# Patient Record
Sex: Female | Born: 1980 | Race: Black or African American | Hispanic: No | Marital: Married | Smoking: Never smoker
Health system: Southern US, Community
[De-identification: ages and names within clinical notes are randomized; demographics above are authoritative.]

## PROBLEM LIST (undated history)

## (undated) ENCOUNTER — Emergency Department (HOSPITAL_COMMUNITY): Admission: EM | Discharge status: Absent without leave

## (undated) DIAGNOSIS — I38 Endocarditis, valve unspecified: Secondary | ICD-10-CM

## (undated) DIAGNOSIS — E6609 Other obesity due to excess calories: Secondary | ICD-10-CM

## (undated) DIAGNOSIS — K219 Gastro-esophageal reflux disease without esophagitis: Secondary | ICD-10-CM

## (undated) DIAGNOSIS — B009 Herpesviral infection, unspecified: Secondary | ICD-10-CM

## (undated) DIAGNOSIS — R011 Cardiac murmur, unspecified: Secondary | ICD-10-CM

## (undated) DIAGNOSIS — R7303 Prediabetes: Secondary | ICD-10-CM

## (undated) DIAGNOSIS — Z6835 Body mass index (BMI) 35.0-35.9, adult: Secondary | ICD-10-CM

## (undated) DIAGNOSIS — O24419 Gestational diabetes mellitus in pregnancy, unspecified control: Secondary | ICD-10-CM

## (undated) DIAGNOSIS — E559 Vitamin D deficiency, unspecified: Secondary | ICD-10-CM

## (undated) HISTORY — DX: Gestational diabetes mellitus in pregnancy, unspecified control: O24.419

## (undated) HISTORY — DX: Vitamin D deficiency, unspecified: E55.9

## (undated) HISTORY — PX: TONSILLECTOMY: SUR1361

## (undated) HISTORY — PX: OTHER SURGICAL HISTORY: SHX169

## (undated) HISTORY — DX: Gastro-esophageal reflux disease without esophagitis: K21.9

## (undated) HISTORY — DX: Prediabetes: R73.03

## (undated) HISTORY — DX: Other obesity due to excess calories: E66.09

## (undated) HISTORY — DX: Body mass index (BMI) 35.0-35.9, adult: Z68.35

## (undated) HISTORY — DX: Cardiac murmur, unspecified: R01.1

## (undated) HISTORY — DX: Endocarditis, valve unspecified: I38

---
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
## 1999-02-05 ENCOUNTER — Encounter: Admission: RE | Admit: 1999-02-05 | Discharge: 1999-02-05 | Payer: Self-pay | Admitting: Internal Medicine

## 1999-02-05 ENCOUNTER — Encounter: Payer: Self-pay | Admitting: Internal Medicine

## 1999-02-05 ENCOUNTER — Encounter: Admission: RE | Admit: 1999-02-05 | Discharge: 1999-03-25 | Payer: Self-pay | Admitting: Internal Medicine

## 1999-02-11 ENCOUNTER — Encounter: Payer: Self-pay | Admitting: Orthopedic Surgery

## 1999-02-11 ENCOUNTER — Ambulatory Visit (HOSPITAL_COMMUNITY): Admission: RE | Admit: 1999-02-11 | Discharge: 1999-02-11 | Payer: Self-pay | Admitting: Orthopedic Surgery

## 1999-10-09 ENCOUNTER — Other Ambulatory Visit: Admission: RE | Admit: 1999-10-09 | Discharge: 1999-10-09 | Payer: Self-pay | Admitting: Obstetrics & Gynecology

## 2000-08-16 ENCOUNTER — Encounter: Admission: RE | Admit: 2000-08-16 | Discharge: 2000-08-16 | Payer: Self-pay | Admitting: Internal Medicine

## 2000-08-16 ENCOUNTER — Encounter: Payer: Self-pay | Admitting: Internal Medicine

## 2000-10-10 ENCOUNTER — Other Ambulatory Visit: Admission: RE | Admit: 2000-10-10 | Discharge: 2000-10-10 | Payer: Self-pay | Admitting: Obstetrics and Gynecology

## 2001-10-19 ENCOUNTER — Other Ambulatory Visit: Admission: RE | Admit: 2001-10-19 | Discharge: 2001-10-19 | Payer: Self-pay | Admitting: Obstetrics and Gynecology

## 2002-12-03 ENCOUNTER — Other Ambulatory Visit: Admission: RE | Admit: 2002-12-03 | Discharge: 2002-12-03 | Payer: Self-pay | Admitting: Obstetrics and Gynecology

## 2003-12-19 ENCOUNTER — Other Ambulatory Visit: Admission: RE | Admit: 2003-12-19 | Discharge: 2003-12-19 | Payer: Self-pay | Admitting: Obstetrics and Gynecology

## 2004-05-05 ENCOUNTER — Other Ambulatory Visit: Admission: RE | Admit: 2004-05-05 | Discharge: 2004-05-05 | Payer: Self-pay | Admitting: Obstetrics and Gynecology

## 2004-08-04 ENCOUNTER — Other Ambulatory Visit: Admission: RE | Admit: 2004-08-04 | Discharge: 2004-08-04 | Payer: Self-pay | Admitting: Obstetrics and Gynecology

## 2004-09-04 ENCOUNTER — Ambulatory Visit (HOSPITAL_COMMUNITY): Admission: RE | Admit: 2004-09-04 | Discharge: 2004-09-04 | Payer: Self-pay | Admitting: Obstetrics and Gynecology

## 2004-09-04 ENCOUNTER — Encounter (INDEPENDENT_AMBULATORY_CARE_PROVIDER_SITE_OTHER): Payer: Self-pay | Admitting: *Deleted

## 2004-12-14 ENCOUNTER — Other Ambulatory Visit: Admission: RE | Admit: 2004-12-14 | Discharge: 2004-12-14 | Payer: Self-pay | Admitting: Obstetrics and Gynecology

## 2005-03-29 ENCOUNTER — Other Ambulatory Visit: Admission: RE | Admit: 2005-03-29 | Discharge: 2005-03-29 | Payer: Self-pay | Admitting: Obstetrics and Gynecology

## 2005-06-28 ENCOUNTER — Other Ambulatory Visit: Admission: RE | Admit: 2005-06-28 | Discharge: 2005-06-28 | Payer: Self-pay | Admitting: Obstetrics and Gynecology

## 2006-04-11 ENCOUNTER — Other Ambulatory Visit: Admission: RE | Admit: 2006-04-11 | Discharge: 2006-04-11 | Payer: Self-pay | Admitting: Ophthalmology

## 2008-08-20 ENCOUNTER — Encounter: Admission: RE | Admit: 2008-08-20 | Discharge: 2008-08-20 | Payer: Self-pay | Admitting: Obstetrics and Gynecology

## 2008-09-24 ENCOUNTER — Inpatient Hospital Stay (HOSPITAL_COMMUNITY): Admission: AD | Admit: 2008-09-24 | Discharge: 2008-09-24 | Payer: Self-pay | Admitting: Obstetrics and Gynecology

## 2008-10-09 ENCOUNTER — Inpatient Hospital Stay (HOSPITAL_COMMUNITY): Admission: RE | Admit: 2008-10-09 | Discharge: 2008-10-12 | Payer: Self-pay | Admitting: Obstetrics and Gynecology

## 2008-10-13 ENCOUNTER — Encounter: Admission: RE | Admit: 2008-10-13 | Discharge: 2008-10-28 | Payer: Self-pay | Admitting: Obstetrics and Gynecology

## 2009-01-07 ENCOUNTER — Ambulatory Visit: Payer: Self-pay | Admitting: Internal Medicine

## 2009-11-28 ENCOUNTER — Ambulatory Visit (HOSPITAL_BASED_OUTPATIENT_CLINIC_OR_DEPARTMENT_OTHER): Admission: RE | Admit: 2009-11-28 | Discharge: 2009-11-28 | Payer: Self-pay | Admitting: Orthopedic Surgery

## 2009-12-29 ENCOUNTER — Ambulatory Visit: Payer: Self-pay | Admitting: Internal Medicine

## 2010-03-31 LAB — POCT HEMOGLOBIN-HEMACUE: Hemoglobin: 12.3 g/dL (ref 12.0–15.0)

## 2010-04-16 ENCOUNTER — Ambulatory Visit (INDEPENDENT_AMBULATORY_CARE_PROVIDER_SITE_OTHER): Payer: 59 | Admitting: Internal Medicine

## 2010-04-16 DIAGNOSIS — H6690 Otitis media, unspecified, unspecified ear: Secondary | ICD-10-CM

## 2010-04-16 DIAGNOSIS — J069 Acute upper respiratory infection, unspecified: Secondary | ICD-10-CM

## 2010-04-24 LAB — CBC
MCHC: 33.6 g/dL (ref 30.0–36.0)
MCHC: 34.6 g/dL (ref 30.0–36.0)
MCV: 85.4 fL (ref 78.0–100.0)
MCV: 85.5 fL (ref 78.0–100.0)
Platelets: 239 10*3/uL (ref 150–400)
Platelets: 289 10*3/uL (ref 150–400)
RBC: 3.67 MIL/uL — ABNORMAL LOW (ref 3.87–5.11)
RBC: 4.35 MIL/uL (ref 3.87–5.11)
RDW: 14.8 % (ref 11.5–15.5)

## 2010-04-24 LAB — COMPREHENSIVE METABOLIC PANEL
ALT: 22 U/L (ref 0–35)
AST: 23 U/L (ref 0–37)
Albumin: 3 g/dL — ABNORMAL LOW (ref 3.5–5.2)
Alkaline Phosphatase: 149 U/L — ABNORMAL HIGH (ref 39–117)
BUN: 3 mg/dL — ABNORMAL LOW (ref 6–23)
CO2: 21 mEq/L (ref 19–32)
Calcium: 9.7 mg/dL (ref 8.4–10.5)
Chloride: 110 mEq/L (ref 96–112)
Creatinine, Ser: 0.57 mg/dL (ref 0.4–1.2)
GFR calc Af Amer: >60 mL/min (ref >60)
GFR calc non Af Amer: >60 mL/min (ref >60)
Glucose, Bld: 107 mg/dL — ABNORMAL HIGH (ref 70–99)
Potassium: 3.8 mEq/L (ref 3.5–5.1)
Sodium: 136 mEq/L (ref 135–145)
Total Bilirubin: 0.4 mg/dL (ref 0.3–1.2)
Total Protein: 7.1 g/dL (ref 6.0–8.3)

## 2010-04-24 LAB — GLUCOSE, CAPILLARY
Glucose-Capillary: 119 mg/dL — ABNORMAL HIGH (ref 70–99)
Glucose-Capillary: 84 mg/dL (ref 70–99)
Glucose-Capillary: 92 mg/dL (ref 70–99)

## 2010-04-24 LAB — RPR: RPR Ser Ql: NONREACTIVE

## 2010-04-24 LAB — TYPE AND SCREEN
ABO/RH(D): O POS
Antibody Screen: NEGATIVE

## 2010-06-05 NOTE — Op Note (Signed)
NAMESAORY, CARRIERO               ACCOUNT NO.:  [id number]   MEDICAL RECORD NO.:  [id number]          PATIENT TYPE:  AMB   LOCATION:  SDC                           FACILITY:  [facility]   PHYSICIAN:  James A. 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  Pathology pending.   PROCEDURE:  Loop electrical excision procedure.   SURGEON:  Rudy Jew. Ashley Royalty, M.D.   ANESTHESIA:  Monitoring anesthesia care with 1% Xylocaine paracervical  block.   ESTIMATED BLOOD LOSS:  Less than 50 mL.   COMPLICATIONS:  None.   PACKS AND DRAINS:  None.   PROCEDURE:  The patient was taken to the operating room and placed in the  dorsal supine position.  After IV sedation was administered, she was placed  in the lithotomy position.  A galvanized speculum was placed per vagina.  The cervix was visualized with the colposcope. No acetonegative lesions were  noted.  The cervix was then bathed in 5% acetic acid and revsualized with the  colposcope.  As was noticed in the office, there were no the acetopositive  lesions.  Next, the white LEEP electrode was used with a cutting power of 60  watts and a coagulation power 40 watts.  The LEEP specimen was taken using  the cutting waveform.  The specimen was incised and submitted in formalin to  pathology for histologic studies.  The ball electrode was then used to  obtain hemostasis using the coagulation waveform.  Hemostasis was noted.  The surgical bed was treated with Monsel's solution, the procedure  terminated.  Hemostasis was noted.   The patient tolerated procedure extremely well and was returned to the  recovery room in good  condition.           ______________________________  Rudy Jew Ashley Royalty, M.D.     JAM/MEDQ  D:  09/04/2004  T:  09/04/2004  Job:  [id number]

## 2010-06-05 NOTE — H&P (Signed)
[name], [name]               ACCOUNT NO.:  [id number]   MEDICAL RECORD NO.:  [id number]          PATIENT TYPE:  AMB   LOCATION:  SDC                           [facility]ACILITY:  [facility]   PHYSICIAN:  James A. Ashley Royalty, M.D.DATE OF BIRTH:  [birth year]   DATE OF ADMISSION:  09/04/2004  DATE OF DISCHARGE:                                HISTORY & PHYSICAL   HISTORY OF PRESENT ILLNESS:  A 30 year old nulligravida with CIN-2 on recent  Pap with negative recent colposcopy and negative endocervical curettage.  The patient for loupe electrical excision procedure.   MEDICATIONS:  Estrostep.   PAST MEDICAL HISTORY:  Negative.   PAST SURGICAL HISTORY:  LEEP in 1996.   ALLERGIES:  No known drug allergies.   FAMILY HISTORY:  Positive for hypertension, diabetes, and breast cancer.   SOCIAL HISTORY:  The patient denies the use of tobacco or significant  alcohol.   REVIEW OF SYSTEMS:  Noncontributory.   PHYSICAL EXAMINATION:  GENERAL:  A well-developed, well-nourished, pleasant  black female in no acute distress.  VITAL SIGNS:  Afebrile, vital signs stable.  CHEST:  Lungs are clear.  HEART:  Regular rate and rhythm.  ABDOMEN:  Soft and nontender.  PELVIC:  External genitalia within normal limits.  Vagina and cervix are  without gross lesions.  Bimanual examination reveals the uterus to be  approximately 8 x 4 x 4 cm and no adnexal masses are palpable.   IMPRESSION:  CIN-2 on recent Pap with negative recent colposcopy and  negative endocervical curettage.   PLAN:  Loupe electrical excision procedure.  Risks, benefits, complications,  and alternatives were fully discussed and accepted.  Questions invited and  answered.   Please note a portion of the evaluation for this document was performed in  the outpatient setting.           ______________________________  Rudy Jew. Ashley Royalty, M.D.    JAM/MEDQ  D:  09/04/2004  T:  09/04/2004  Job:  [id number]

## 2010-10-08 ENCOUNTER — Encounter: Payer: Self-pay | Admitting: Internal Medicine

## 2010-10-08 ENCOUNTER — Ambulatory Visit: Payer: 59 | Admitting: Internal Medicine

## 2010-10-09 ENCOUNTER — Ambulatory Visit (INDEPENDENT_AMBULATORY_CARE_PROVIDER_SITE_OTHER): Payer: 59 | Admitting: Internal Medicine

## 2010-10-09 ENCOUNTER — Encounter: Payer: Self-pay | Admitting: Internal Medicine

## 2010-10-09 DIAGNOSIS — H6500 Acute serous otitis media, unspecified ear: Secondary | ICD-10-CM

## 2010-10-09 DIAGNOSIS — E669 Obesity, unspecified: Secondary | ICD-10-CM

## 2010-10-09 DIAGNOSIS — J029 Acute pharyngitis, unspecified: Secondary | ICD-10-CM

## 2010-10-09 DIAGNOSIS — J069 Acute upper respiratory infection, unspecified: Secondary | ICD-10-CM

## 2010-10-12 DIAGNOSIS — E669 Obesity, unspecified: Secondary | ICD-10-CM | POA: Insufficient documentation

## 2010-10-12 NOTE — Progress Notes (Signed)
  Subjective:    Patient ID: [name], female    DOB: Jul 26, 1980, 30 y.o.   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 Has malaise and fatigue with this illness.   Review of Systems     Objective:   Physical Exam TMs are slightly full bilaterally. Pharynx slightly injected without exudate. Rapid strep screen is negative.  Neck is supple without adenopathy. She sounds nasally congested.        Assessment & Plan:  Upper respiratory infection  Serous otitis media  Pharyngitis  Plan: Zithromax Z-Pak ( 6 tablets) 2 by mouth day one followed by 1 by mouth days 2 through 5

## 2011-03-09 ENCOUNTER — Encounter: Payer: Self-pay | Admitting: Internal Medicine

## 2011-03-09 ENCOUNTER — Ambulatory Visit (INDEPENDENT_AMBULATORY_CARE_PROVIDER_SITE_OTHER): Payer: 59 | Admitting: Internal Medicine

## 2011-03-09 VITALS — BP 116/88 | HR 84 | Temp 99.1°F | Wt 238.0 lb

## 2011-03-09 DIAGNOSIS — H6693 Otitis media, unspecified, bilateral: Secondary | ICD-10-CM

## 2011-03-09 DIAGNOSIS — F4321 Adjustment disorder with depressed mood: Secondary | ICD-10-CM

## 2011-03-09 DIAGNOSIS — J069 Acute upper respiratory infection, unspecified: Secondary | ICD-10-CM

## 2011-03-09 DIAGNOSIS — H6690 Otitis media, unspecified, unspecified ear: Secondary | ICD-10-CM

## 2011-03-09 DIAGNOSIS — J329 Chronic sinusitis, unspecified: Secondary | ICD-10-CM

## 2011-03-09 MED ORDER — METHYLPREDNISOLONE ACETATE 80 MG/ML IJ SUSP
80.0000 mg | Freq: Once | INTRAMUSCULAR | Status: AC
  Administered 2011-03-09: 80 mg via INTRAMUSCULAR

## 2011-03-09 NOTE — Progress Notes (Signed)
  Subjective:    Patient ID: [name], female    DOB: [birth year], 31 y.o.   MRN: [id number]  HPI patient came down with URI symptoms on Saturday, February 16. Has had nasal congestion, sore throat, runny nose, only slight cough. Had nosebleed on February 16. Has had frequent nosebleeds since childhood. No fever or shaking chills. No discolored sputum.    Review of Systems     Objective:   Physical Exam HEENT exam: Right TM is full left TM is dull pharynx slightly injected neck is supple chest clear very boggy nasal mucosa particularly right nostril. No evidence of recent nosebleed.        Assessment & Plan:  URI  Bilateral otitis media  For grief reaction-tells me mother in law died of complications of cardiac arrest in early January. Patient and her husband had discovered mother was drinking alcohol in their home on keeping her child. Has not really seen mother-in-law since September 2012. This was somewhat disturbing to Loretha but felt she could no longer trust her mother-in-law to keep her child. Mother will have been invited for Christmas but called to say she was not feeling well. Later that were told she had kidney failure in addition to the cardiac arrest. Spent 15 minutes talking with patient about this issue. Seems to be handling grief reaction fairly well.  Obesity

## 2011-03-09 NOTE — Patient Instructions (Signed)
Take Zithromax Z-PAK as corrected. Not better in one week have prescription refill. Call if not better in 10 days to 2 weeks. You have  been given injection of Depo-Medrol today for nasal congestion

## 2011-04-12 ENCOUNTER — Ambulatory Visit (INDEPENDENT_AMBULATORY_CARE_PROVIDER_SITE_OTHER): Payer: 59 | Admitting: Obstetrics and Gynecology

## 2011-04-12 DIAGNOSIS — Z01419 Encounter for gynecological examination (general) (routine) without abnormal findings: Secondary | ICD-10-CM

## 2011-09-30 ENCOUNTER — Ambulatory Visit (INDEPENDENT_AMBULATORY_CARE_PROVIDER_SITE_OTHER): Payer: BC Managed Care – PPO | Admitting: Internal Medicine

## 2011-09-30 ENCOUNTER — Encounter: Payer: Self-pay | Admitting: Internal Medicine

## 2011-09-30 VITALS — BP 110/66 | HR 68 | Temp 98.5°F | Wt 246.0 lb

## 2011-09-30 DIAGNOSIS — H6693 Otitis media, unspecified, bilateral: Secondary | ICD-10-CM

## 2011-09-30 DIAGNOSIS — H6690 Otitis media, unspecified, unspecified ear: Secondary | ICD-10-CM

## 2011-09-30 DIAGNOSIS — J309 Allergic rhinitis, unspecified: Secondary | ICD-10-CM

## 2011-09-30 DIAGNOSIS — R5383 Other fatigue: Secondary | ICD-10-CM

## 2011-09-30 DIAGNOSIS — R5381 Other malaise: Secondary | ICD-10-CM

## 2011-09-30 DIAGNOSIS — H9209 Otalgia, unspecified ear: Secondary | ICD-10-CM

## 2011-09-30 DIAGNOSIS — R7301 Impaired fasting glucose: Secondary | ICD-10-CM

## 2011-09-30 DIAGNOSIS — E669 Obesity, unspecified: Secondary | ICD-10-CM

## 2011-09-30 DIAGNOSIS — H9203 Otalgia, bilateral: Secondary | ICD-10-CM

## 2011-09-30 DIAGNOSIS — Z8632 Personal history of gestational diabetes: Secondary | ICD-10-CM

## 2011-09-30 LAB — TSH: TSH: 1.611 u[IU]/mL (ref 0.350–4.500)

## 2011-09-30 MED ORDER — METHYLPREDNISOLONE ACETATE 80 MG/ML IJ SUSP
80.0000 mg | Freq: Once | INTRAMUSCULAR | Status: AC
  Administered 2011-09-30: 80 mg via INTRAMUSCULAR

## 2011-09-30 NOTE — Progress Notes (Signed)
  Subjective:    Patient ID: [name], female    DOB: September 04, 1980, 31 y.o.   MRN: [id number]  HPI  31 year old Black female with long-standing history of obesity. She is displeased with her weight.  She is considering another pregnancy. With first pregnancy, she had gestational diabetes. Hemoglobin A1c was drawn today along with TSH.   She is really here about complaint of ear pain and congestion. Has postnasal drip. No sore throat. A month or so ago she was seen at an Urgent Care in the [facility] area and treated with Amoxicillin which caused her to have diarrhea. No discolored sputum production. No fever or chills. No myalgias.    Review of Systems     Objective:   Physical Exam HEENT exam: TMs are full, dull, and pink bilaterally.  Pharynx is clear without exudate. Neck is supple without adenopathy. Chest is clear to auscultation. Boggy nasal mucosa. No thyromegaly. Skin is warm and dry.        Assessment & Plan:  Obesity Obesity      Obesity-TSH drawn today spent 15 minutes discussing various weight loss regimens with her. If she is considering another pregnancy would be best to do something like Weight Watchers. Recommended 1500-calorie diet for now. Of course caloric needs would increase with pregnancy. She needs to be exercising on a daily basis. She admits to eating a lot of fast food and eating late at night. Obviously she's not really trying to watch her calories at this point in time. There is a family history of obesity. History of gestational diabetes-rule out impaired glucose tolerance. Hemoglobin A1c drawn today  Bilateral otitis media  Allergic rhinitis  Plan: Provided telephone number of Allergist for her. She can consider allergy testing in the near future. She is on prenatal vitamins. Recommend 1500-calorie diet at this point in time and discussed diet extensively with her. She admitted that she wanted to see a nutritionist. I think Weight Watchers is  fine. Talked about high caloric foods and avoiding them.  Zithromax Z-PAK take 2 tablets day one followed by 1 tablet days 2 through 5. This is for otitis media.  Continue prenatal vitamins. TSH and Hemoglobin A1c pending.

## 2011-09-30 NOTE — Patient Instructions (Addendum)
Take Zithromax Z-Pak 2 tablets day one followed by 1 tablet days 2 through 5. Consider allergy testing. Take prenatal vitamins. TSH and hemoglobin A1c have been drawn today to look for hypothyroidism and impaired fasting glucose. Please consider Weight Watchers. Try 1500-calorie diet. Avoid fast food. Drinks several 8 ounce glasses of water daily. Exercise daily. Avoid eating late at night. Watch fruits, juices, soda, and high calorie foods.

## 2012-01-18 ENCOUNTER — Ambulatory Visit (INDEPENDENT_AMBULATORY_CARE_PROVIDER_SITE_OTHER): Payer: BC Managed Care – PPO | Admitting: Internal Medicine

## 2012-01-18 ENCOUNTER — Encounter: Payer: Self-pay | Admitting: Internal Medicine

## 2012-01-18 VITALS — BP 120/76 | HR 94 | Temp 98.6°F | Wt 244.0 lb

## 2012-01-18 DIAGNOSIS — H6690 Otitis media, unspecified, unspecified ear: Secondary | ICD-10-CM

## 2012-01-18 DIAGNOSIS — J069 Acute upper respiratory infection, unspecified: Secondary | ICD-10-CM

## 2012-01-18 DIAGNOSIS — H6693 Otitis media, unspecified, bilateral: Secondary | ICD-10-CM

## 2012-01-18 NOTE — Patient Instructions (Addendum)
Take Levaquin 500 milligrams daily for 7 days. Use Ventolin inhaler 2 sprays 4 times daily. Take Tessalon Perles as needed for cough. Call if not better in one week.

## 2012-01-20 NOTE — Progress Notes (Signed)
  Subjective:    Patient ID: [name], female    DOB: [birth year], 32 y.o.   MRN: [id number]  HPI67 year old [place] female with maliase and fatigue with URI symptoms onset 01/15/12. No fever or chills. No discolored sputum production. Mainly has some upper respiratory congestion.    Review of Systems     Objective:   Physical Exam HEENT exam: She has a bilateral serous otitis media. Pharynx is slightly injected. Neck is supple. Chest clear to auscultation. Skin is warm and dry.        Assessment & Plan:  BOM  URI  Plan: Levaquin 500 milligrams daily for 7 days. Tessalon Perles 100 mg (#60) 2 by mouth 3 times a day when necessary cough. Use Ventolin inhaler 2 sprays by mouth 4 times daily for chest tightness.  Call if not better in 7 days.

## 2012-01-24 ENCOUNTER — Telehealth: Payer: Self-pay

## 2012-01-24 ENCOUNTER — Encounter: Payer: Self-pay | Admitting: Internal Medicine

## 2012-01-24 ENCOUNTER — Ambulatory Visit (INDEPENDENT_AMBULATORY_CARE_PROVIDER_SITE_OTHER): Payer: BC Managed Care – PPO | Admitting: Internal Medicine

## 2012-01-24 VITALS — BP 114/80 | HR 76 | Temp 98.4°F | Wt 244.0 lb

## 2012-01-24 DIAGNOSIS — J9801 Acute bronchospasm: Secondary | ICD-10-CM

## 2012-01-24 DIAGNOSIS — J069 Acute upper respiratory infection, unspecified: Secondary | ICD-10-CM

## 2012-01-24 DIAGNOSIS — J40 Bronchitis, not specified as acute or chronic: Secondary | ICD-10-CM

## 2012-01-24 MED ORDER — CEFTRIAXONE SODIUM 1 G IJ SOLR
1.0000 g | Freq: Once | INTRAMUSCULAR | Status: AC
  Administered 2012-01-24: 1 g via INTRAMUSCULAR

## 2012-01-24 NOTE — Telephone Encounter (Signed)
Spoke with patient and advised Dr. Lenord Fellers would like to see her TODAY.  Appt given 1/6 @ 1600 (advised we'll work her in).  Pt verbalizes understanding as she is stating she is having ear pain as well as the SOB.

## 2012-01-24 NOTE — Telephone Encounter (Signed)
You asked her to call if not feeling better. She is not, saying she cant catch her breath. Denies wheezing.

## 2012-01-24 NOTE — Telephone Encounter (Signed)
Needs to be seen today

## 2012-01-25 NOTE — Patient Instructions (Addendum)
He had been given Rocephin 1 g IM. Take Levaquin 500 milligrams daily for additional 7 days. Take Sterapred DS 10 mg 6 day dosepak. Use Ventolin inhaler 4 times daily. Stay out of work today and tomorrow

## 2012-01-25 NOTE — Progress Notes (Signed)
  Subjective:    Patient ID: [name], female    DOB: 1980/09/29, 32 y.o.   MRN: [id number]  HPI At last visit on 01/18/2012,  patient was treated with Levaquin for acute URI along with Ventolin inhaler and Tessalon Perles. Was diagnosed with bilateral serous otitis media and URI. Still complaining of malaise and fatigue. Some difficulty breathing with some mild shortness of breath. No frank wheezing. Says she's been using Ventolin inhaler. Still having some cough. Ears still feel full. Went to work today and said it was too hot at work and she had to come home because she did not feel well    Review of Systems     Objective:   Physical Exam  HEENT exam: Pharynx slightly injected. TMs are clear. Neck is supple without significant adenopathy. Chest clear to auscultation without rales or wheezing. No dyspnea noted. Respiratory rate is normal.       Assessment & Plan:  Bronchospasm-likely is the reason she feels short of breath  Obesity  Plan:  Protracted URI  Obesity  Bronchospasm-likely mild and she is not actively wheezing today but that would account for her shortness of breath symptoms  Bronchitis  Plan: Levaquin 500 milligrams daily for an additional 7 days. Ventolin inhaler 2 sprays by mouth 4 times a day. Rocephin 1 g IM. Given Sterapred DS 10 mg 6 day dosepak. Out of work today and tomorrow.

## 2012-02-22 ENCOUNTER — Encounter: Payer: Self-pay | Admitting: Internal Medicine

## 2012-02-22 ENCOUNTER — Ambulatory Visit (INDEPENDENT_AMBULATORY_CARE_PROVIDER_SITE_OTHER): Payer: BC Managed Care – PPO | Admitting: Internal Medicine

## 2012-02-22 VITALS — BP 112/70 | HR 108 | Temp 102.5°F | Wt 246.0 lb

## 2012-02-22 DIAGNOSIS — J039 Acute tonsillitis, unspecified: Secondary | ICD-10-CM

## 2012-02-22 DIAGNOSIS — H6692 Otitis media, unspecified, left ear: Secondary | ICD-10-CM

## 2012-02-22 DIAGNOSIS — H6690 Otitis media, unspecified, unspecified ear: Secondary | ICD-10-CM

## 2012-02-22 LAB — CBC WITH DIFFERENTIAL/PLATELET
Basophils Relative: 0 % (ref 0–1)
HCT: 36.4 % (ref 36.0–46.0)
Hemoglobin: 12.6 g/dL (ref 12.0–15.0)
Lymphocytes Relative: 10 % — ABNORMAL LOW (ref 12–46)
MCHC: 34.6 g/dL (ref 30.0–36.0)
Monocytes Absolute: 1.1 10*3/uL — ABNORMAL HIGH (ref 0.1–1.0)
Monocytes Relative: 8 % (ref 3–12)
Neutro Abs: 11.3 10*3/uL — ABNORMAL HIGH (ref 1.7–7.7)
Neutrophils Relative %: 81 % — ABNORMAL HIGH (ref 43–77)
RBC: 4.42 MIL/uL (ref 3.87–5.11)
WBC: 13.9 10*3/uL — ABNORMAL HIGH (ref 4.0–10.5)

## 2012-02-22 LAB — POCT RAPID STREP A (OFFICE): Rapid Strep A Screen: NEGATIVE

## 2012-02-22 MED ORDER — CEFTRIAXONE SODIUM 1 G IJ SOLR
1.0000 g | Freq: Once | INTRAMUSCULAR | Status: AC
  Administered 2012-02-22: 1 g via INTRAMUSCULAR

## 2012-02-22 NOTE — Progress Notes (Signed)
  Subjective:    Patient ID: [name], female    DOB: 12-17-1980, 32 y.o.   MRN: [id number]  HPI Patient is back again accompanied by her husband and her mother today with sore throat and left ear pain. She has had headache and shaking chills. They feel she may have the flu. Some confusion about whether or not she actually got a flu shot. She was here on December 31 with respiratory infection treated with Levaquin and again on January 6 for failure to get better treated again with Levaquin, Rocephin IM and Sterapred DS 10 mg 6 day dosepak. Patient is asking about having her tonsils removed because of frequent episodes of pharyngitis. I have not seen significant tonsillitis until today. Prior to December she was last here in September 2013 complaining of fatigue. She was here February 2013 with sinusitis. She does not deal well with illness. She remains overweight.   Review of Systems     Objective:   Physical Exam HEENT exam: Tonsils are enlarged and there is exudate on both tonsils. Rapid strep screen is negative. Left TM is dull and red. Neck is supple without significant adenopathy. Chest is clear to auscultation. CBC with differential and Monospot test drawn today. Throat culture was taken.        Assessment & Plan:  Tonsillitis  Left otitis media  Plan: Rocephin 1 g IM. Doxycycline 100 mg twice daily for 10 days. Check Monospot test and CBC with differential. Refer to ENT for evaluation of tonsils.

## 2012-02-22 NOTE — Patient Instructions (Addendum)
You had a throat culture and Monospot test done today along with CBC. Take doxycycline 100 mg twice daily for 10 days. ENT appointment will be arranged. Appointment with Dr. Lazarus Salines on 02/10/2014at 2:40pm. Patient aware of this

## 2012-02-23 ENCOUNTER — Telehealth: Payer: Self-pay | Admitting: Internal Medicine

## 2012-02-23 LAB — MONONUCLEOSIS SCREEN: Mono Screen: NEGATIVE

## 2012-02-23 NOTE — Telephone Encounter (Signed)
Called patient to give results of mono test which was negative and CBC which showed white blood cell count 13,900. Says her throat hurts a little bit less. Still sounds like she has a lot of malaise and fatigue. She is asking about throat culture results which are still pending. Says she's not been trying to get pregnant since the first of the year because she has been sick. ENT appointment will be obtained for her regarding enlarged tonsils.

## 2012-02-24 LAB — CULTURE, GROUP A STREP: Organism ID, Bacteria: NORMAL

## 2012-02-24 NOTE — Progress Notes (Signed)
Results of throat culture are negative- no strep isolated. Patient informed

## 2012-03-08 ENCOUNTER — Ambulatory Visit (INDEPENDENT_AMBULATORY_CARE_PROVIDER_SITE_OTHER): Payer: BC Managed Care – PPO | Admitting: Internal Medicine

## 2012-03-08 ENCOUNTER — Telehealth: Payer: Self-pay | Admitting: Internal Medicine

## 2012-03-08 ENCOUNTER — Encounter: Payer: Self-pay | Admitting: Internal Medicine

## 2012-03-08 VITALS — BP 116/78 | HR 68 | Temp 98.4°F | Wt 244.0 lb

## 2012-03-08 DIAGNOSIS — J069 Acute upper respiratory infection, unspecified: Secondary | ICD-10-CM

## 2012-03-08 DIAGNOSIS — H6693 Otitis media, unspecified, bilateral: Secondary | ICD-10-CM

## 2012-03-08 DIAGNOSIS — H6690 Otitis media, unspecified, unspecified ear: Secondary | ICD-10-CM

## 2012-03-08 NOTE — Telephone Encounter (Signed)
See today

## 2012-03-08 NOTE — Progress Notes (Signed)
  Subjective:    Patient ID: [name], female    DOB: [birth year], 32 y.o.   MRN: [id number]  HPI 32 year old Black female scheduled for Tonsillectomy tomorrow but came down with URI 2 days ago after playing in the snow with daughter and husband on Thursday Feb 13th. All family members are ill with URIs. Child is in daycare. 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 Complaining of scratchy throat and stopped up ears. Says she has cancelled Tonsillectomy for tomorrow because of developing chest congestion. No wheezing. Never been allergy testing but has had recurrent issues with URI and sore throat since Christmas.    Review of Systems     Objective:   Physical Exam  Constitutional: She appears well-developed and well-nourished. No distress.  HENT:  Head: Normocephalic.  Both ears are dull and pink. Pharynx without exudate and only slightly red  Neck: Neck supple. No thyromegaly present.  Cardiovascular: Normal rate and regular rhythm.   Pulmonary/Chest: Effort normal and breath sounds normal. No respiratory distress. She has no wheezes. She has no rales.  Lymphadenopathy:    She has no cervical adenopathy.  Skin: Skin is warm and dry. She is not diaphoretic.  ;        Assessment & Plan:  URI BOM  Plan: Agree with postponing surgery 1-2 weeks. Levaquin 500mg  daily for 7 days. May try OTC decongestant Sudafed PE for stuffy ears. Tylenol for fever and sore throat pain.

## 2012-03-08 NOTE — Telephone Encounter (Signed)
Should she proceed with the surgery or be seen for the cough/congestion that she is currently having?  Talked about the possibility of symptoms improving post surgery since Dr. Lazarus Salines thought that she needed tonsillectomy.  Patient is anxious and "doesn't want to feel bad in the recovery phase".  Would like to know if YOU think she needs to be seen for this cough/congestion prior to surgery or just proceed with the surgery.  She is NOT having fever.  Please advise.

## 2012-03-08 NOTE — Patient Instructions (Addendum)
Take Levaquin daily for 7 days. Call to reschedule tonsillectomy in 1-2 weeks.

## 2012-04-06 ENCOUNTER — Other Ambulatory Visit: Payer: Self-pay | Admitting: Obstetrics and Gynecology

## 2012-04-07 LAB — PAP IG W/ RFLX HPV ASCU

## 2012-04-10 LAB — HUMAN PAPILLOMAVIRUS, HIGH RISK: HPV DNA High Risk: NOT DETECTED

## 2012-05-26 ENCOUNTER — Encounter: Payer: Self-pay | Admitting: Internal Medicine

## 2012-05-26 ENCOUNTER — Ambulatory Visit (INDEPENDENT_AMBULATORY_CARE_PROVIDER_SITE_OTHER): Payer: BC Managed Care – PPO | Admitting: Internal Medicine

## 2012-05-26 VITALS — BP 110/74 | HR 84 | Temp 99.0°F | Wt 233.0 lb

## 2012-05-26 DIAGNOSIS — R059 Cough, unspecified: Secondary | ICD-10-CM

## 2012-05-26 DIAGNOSIS — J309 Allergic rhinitis, unspecified: Secondary | ICD-10-CM

## 2012-05-26 DIAGNOSIS — R05 Cough: Secondary | ICD-10-CM

## 2012-05-26 MED ORDER — METHYLPREDNISOLONE ACETATE 80 MG/ML IJ SUSP
80.0000 mg | Freq: Once | INTRAMUSCULAR | Status: AC
  Administered 2012-05-26: 80 mg via INTRAMUSCULAR

## 2012-05-28 ENCOUNTER — Encounter: Payer: Self-pay | Admitting: Internal Medicine

## 2012-05-28 NOTE — Progress Notes (Signed)
  Subjective:    Patient ID: [name], female    DOB: 08-19-1980, 32 y.o.   MRN: [id number]  HPI  32 year old Black female s/p tonsillectomy late February 2014 now with sorethroat and nasal congestion. Thinks it may be allergy related. Discolored nasal drainage and stuffiness. No fever. No ear pain. Taling Zyrtec. Cough with cream colored sputum. Mother has sarcoidosis. Paternal uncle dx with pancreatic cancer.    Review of Systems     Objective:   Physical Exam  Boggy nasal mucosa. Pharynx slightly injected; Neck: supple without adenopathy;  Chest is clear. TMs slightly full but not red. Skin warm and dry without rash.        Assessment & Plan:  Sinusitis Allergic rhinitis Plan:  CXR PA and lateral- did not have one prior to tonsillectomy and mother with Hx of sarcoidosis. Allergy consultation if symptoms persist, Zithromax Z pak Take 2 tabs day one then one tab days 2-5. Depomedol 80 mg IM for nasal congestion.

## 2012-05-28 NOTE — Patient Instructions (Addendum)
You have been given Depomedrol 80 mg IM, Take Zpak as directed.

## 2012-06-01 ENCOUNTER — Emergency Department (HOSPITAL_COMMUNITY): Payer: BC Managed Care – PPO

## 2012-06-01 ENCOUNTER — Emergency Department (HOSPITAL_COMMUNITY)
Admission: EM | Admit: 2012-06-01 | Discharge: 2012-06-01 | Disposition: A | Discharge status: Home / Self Care | Payer: BC Managed Care – PPO | Attending: Emergency Medicine | Admitting: Emergency Medicine

## 2012-06-01 ENCOUNTER — Encounter (HOSPITAL_COMMUNITY): Payer: Self-pay | Admitting: *Deleted

## 2012-06-01 DIAGNOSIS — Z8659 Personal history of other mental and behavioral disorders: Secondary | ICD-10-CM | POA: Insufficient documentation

## 2012-06-01 DIAGNOSIS — Z3202 Encounter for pregnancy test, result negative: Secondary | ICD-10-CM | POA: Insufficient documentation

## 2012-06-01 DIAGNOSIS — N200 Calculus of kidney: Secondary | ICD-10-CM | POA: Insufficient documentation

## 2012-06-01 DIAGNOSIS — R112 Nausea with vomiting, unspecified: Secondary | ICD-10-CM | POA: Insufficient documentation

## 2012-06-01 DIAGNOSIS — Z79899 Other long term (current) drug therapy: Secondary | ICD-10-CM | POA: Insufficient documentation

## 2012-06-01 DIAGNOSIS — Z8719 Personal history of other diseases of the digestive system: Secondary | ICD-10-CM | POA: Insufficient documentation

## 2012-06-01 DIAGNOSIS — Z862 Personal history of diseases of the blood and blood-forming organs and certain disorders involving the immune mechanism: Secondary | ICD-10-CM | POA: Insufficient documentation

## 2012-06-01 LAB — URINE MICROSCOPIC-ADD ON

## 2012-06-01 LAB — URINALYSIS, ROUTINE W REFLEX MICROSCOPIC
Bilirubin Urine: NEGATIVE
Nitrite: NEGATIVE
Specific Gravity, Urine: 1.037 — ABNORMAL HIGH (ref 1.005–1.030)
Urobilinogen, UA: 0.2 mg/dL (ref 0.0–1.0)
pH: 6 (ref 5.0–8.0)

## 2012-06-01 LAB — PREGNANCY, URINE: Preg Test, Ur: NEGATIVE

## 2012-06-01 MED ORDER — KETOROLAC TROMETHAMINE 30 MG/ML IJ SOLN
30.0000 mg | Freq: Once | INTRAMUSCULAR | Status: AC
  Administered 2012-06-01: 30 mg via INTRAVENOUS
  Filled 2012-06-01: qty 1

## 2012-06-01 MED ORDER — OXYCODONE-ACETAMINOPHEN 5-325 MG PO TABS: 1.0000 | ORAL_TABLET | Freq: Four times a day (QID) | ORAL | Status: DC | PRN

## 2012-06-01 MED ORDER — ONDANSETRON HCL 4 MG PO TABS: 4.0000 mg | ORAL_TABLET | Freq: Four times a day (QID) | ORAL | Status: DC

## 2012-06-01 MED ORDER — ONDANSETRON HCL 4 MG/2ML IJ SOLN
4.0000 mg | INTRAMUSCULAR | Status: AC
  Administered 2012-06-01: 4 mg via INTRAVENOUS
  Filled 2012-06-01: qty 2

## 2012-06-01 MED ORDER — TAMSULOSIN HCL 0.4 MG PO CAPS: 0.4000 mg | ORAL_CAPSULE | Freq: Every day | ORAL | Status: DC | PRN

## 2012-06-01 MED ORDER — HYDROMORPHONE HCL PF 1 MG/ML IJ SOLN
1.0000 mg | Freq: Once | INTRAMUSCULAR | Status: AC
  Administered 2012-06-01: 1 mg via INTRAVENOUS
  Filled 2012-06-01: qty 1

## 2012-06-01 MED ORDER — HYDROMORPHONE HCL PF 1 MG/ML IJ SOLN
0.5000 mg | Freq: Once | INTRAMUSCULAR | Status: AC
  Administered 2012-06-01: 0.5 mg via INTRAVENOUS
  Filled 2012-06-01: qty 1

## 2012-06-01 NOTE — ED Notes (Signed)
Left flank pain x 2 hours with n/v

## 2012-06-01 NOTE — ED Provider Notes (Signed)
History     CSN: [id number]  Arrival date & time 06/01/12  0141   None     Chief Complaint  Patient presents with  . Flank Pain    (Consider location/radiation/quality/duration/timing/severity/associated sxs/prior treatment) HPI Comments: Patient is a 32 year old female who presents for left flank pain radiating to her inguinal region with onset 4 hours ago. Patient states the pain is constant without any aggravating or alleviating factors. She describes the pain as an aching sensation with intermittent sharp pains. Patient admits to associated nausea with emesis x5, primarily when the pain is at its worst, all of which have been nonbloody and nonbilious. Patient denies fevers, chest pain, shortness of breath, dysuria, hematuria, urinary frequency, diarrhea, melena or hematochezia, and numbness or tingling in her extremities. Patient denies a history of kidney stones.  Patient is a 32 y.o. female presenting with flank pain. The history is provided by the patient. No language interpreter was used.  Flank Pain Associated symptoms include nausea and vomiting. Pertinent negatives include no chest pain or fever.    Past Medical History  Diagnosis Date  . GERD (gastroesophageal reflux disease)   . Anemia   . Depression     Past Surgical History  Procedure Laterality Date  . Excision of pilonidal cyst    . Tonsillectomy    . Cesarean section      Family History  Problem Relation Age of Onset  . Hypertension Mother     History  Substance Use Topics  . Smoking status: Never Smoker   . Smokeless tobacco: Never Used  . Alcohol Use: Yes     Comment: rarely    OB History   Grav Para Term Preterm Abortions TAB SAB Ect Mult Living                  Review of Systems  Constitutional: Negative for fever.  Respiratory: Negative for shortness of breath.   Cardiovascular: Negative for chest pain.  Gastrointestinal: Positive for nausea and vomiting.  Genitourinary: Positive for  flank pain.  All other systems reviewed and are negative.    Allergies  Review of patient's allergies indicates no known allergies.  Home Medications   Current Outpatient Rx  Name  Route  Sig  Dispense  Refill  . cetirizine (ZYRTEC) 10 MG tablet   Oral   Take 10 mg by mouth daily.         . Multiple Vitamin (MULTIVITAMIN) capsule   Oral   Take 1 capsule by mouth daily.         . ondansetron (ZOFRAN) 4 MG tablet   Oral   Take 1 tablet (4 mg total) by mouth every 6 (six) hours.   12 tablet   0   . oxyCODONE-acetaminophen (PERCOCET/ROXICET) 5-325 MG per tablet   Oral   Take 1-2 tablets by mouth every 6 (six) hours as needed for pain.   20 tablet   0   . tamsulosin (FLOMAX) 0.4 MG CAPS   Oral   Take 1 capsule (0.4 mg total) by mouth daily as needed. Until stone passes   10 capsule   0     BP 131/74  Pulse 87  Temp(Src) 98.9 F (37.2 C) (Oral)  Resp 18  Ht 5\' 2"  (1.575 m)  Wt 240 lb (108.863 kg)  BMI 43.89 kg/m2  SpO2 97%  LMP 04/20/2012  Physical Exam  Nursing note and vitals reviewed. Constitutional: She is oriented to person, place, and time. She appears well-developed  and well-nourished. No distress.  HENT:  Head: Normocephalic and atraumatic.  Mouth/Throat: Oropharynx is clear and moist. No oropharyngeal exudate.  Eyes: Conjunctivae and EOM are normal. Pupils are equal, round, and reactive to light. Right eye exhibits no discharge. Left eye exhibits no discharge. No scleral icterus.  Neck: Normal range of motion. Neck supple.  Cardiovascular: Normal rate, regular rhythm and intact distal pulses.   Pulmonary/Chest: Effort normal and breath sounds normal. No respiratory distress. She has no wheezes. She has no rales. She exhibits no tenderness.  Abdominal: Soft. She exhibits no distension and no mass. There is tenderness (LLQ and L CVA tenderness). There is no rebound and no guarding.  No peritoneal signs  Musculoskeletal: Normal range of motion. She  exhibits no edema.  Lymphadenopathy:    She has no cervical adenopathy.  Neurological: She is alert and oriented to person, place, and time.  Skin: Skin is warm and dry. No rash noted. She is not diaphoretic. No erythema. No pallor.  Psychiatric: She has a normal mood and affect. Her behavior is normal.    ED Course  Procedures (including critical care time)  Labs Reviewed  URINALYSIS, ROUTINE W REFLEX MICROSCOPIC - Abnormal; Notable for the following:    APPearance CLOUDY (*)    Specific Gravity, Urine 1.037 (*)    Hgb urine dipstick LARGE (*)    Protein, ur 30 (*)    All other components within normal limits  PREGNANCY, URINE  URINE MICROSCOPIC-ADD ON   Ct Abdomen Pelvis Wo Contrast  06/01/2012   *RADIOLOGY REPORT*  Clinical Data: Left flank pain  CT ABDOMEN AND PELVIS WITHOUT CONTRAST  Technique:  Multidetector CT imaging of the abdomen and pelvis was performed following the standard protocol without intravenous contrast.  Comparison: None.  Findings: Limited images through the lung bases demonstrate no significant appreciable abnormality. The heart size is within normal limits. No pleural or pericardial effusion.  Organ abnormality/lesion detection is limited in the absence of intravenous contrast. Within this limitation, unremarkable liver, spleen, pancreas, adrenal glands, biliary system.  Mild left renal edema and hydroureteronephrosis to the level of a proximal ureteral stone measuring 3 mm.  Bowel loops are normal in course and caliber.  Normal appendix.  No free intraperitoneal air.  No lymphadenopathy.  Normal caliber vasculature.  Incompletely evaluated uterus.  No adnexal mass.  Trace free fluid within the pelvis. Decompressed bladder.  No acute osseous finding.  IMPRESSION: Mild left renal edema and hydroureteronephrosis to the level of the proximal 3 mm left ureteral stone.   Original Report Authenticated By: Jearld Lesch, M.D.    1. Nephrolithiasis     MDM  Patient is  a 32 year old female who presents for left flank pain radiating to her inguinal region with onset 4 hours ago. Physical exam significant for left CVA tenderness as well as focal tenderness in the left lower quadrant. Urine with TNTC RBCs without evidence of infection. CT abdomen/pelvis ordered to evaluate for kidney stone.   CT abdomen pelvis significant for a 3 mm left ureteral stone with mild renal edema and hydroureteronephrosis. Patient states that symptoms well controlled with IV Toradol, Dilaudid, and Zofran. She is hemodynamically stable, afebrile, and appropriate for discharge with urology follow up. Patient prescribed Flomax, Zofran, and Percocet for symptomatic management. Indications for ED return discussed. Patient states comfort and understanding with this discharge plan with no unaddressed concerns.   Filed Vitals:   06/01/12 0145 06/01/12 0608  BP: 131/74 123/70  Pulse: 87 86  Temp: 98.9 F (37.2 C)   TempSrc: Oral   Resp: 18 18  Height: 5\' 2"  (1.575 m)   Weight: 240 lb (108.863 kg)   SpO2: 97% 100%        Antony Madura, PA-C 06/05/12 1954

## 2012-06-01 NOTE — ED Notes (Signed)
Patient transported to CT 

## 2012-06-06 NOTE — ED Provider Notes (Signed)
Medical screening examination/treatment/procedure(s) were performed by non-physician practitioner and as supervising physician I was immediately available for consultation/collaboration.  Olivia Mackie, MD 06/06/12 [phone]

## 2012-10-20 ENCOUNTER — Encounter: Payer: Self-pay | Admitting: Internal Medicine

## 2012-10-20 ENCOUNTER — Ambulatory Visit (INDEPENDENT_AMBULATORY_CARE_PROVIDER_SITE_OTHER): Payer: BC Managed Care – PPO | Admitting: Internal Medicine

## 2012-10-20 VITALS — BP 114/80 | HR 80 | Temp 98.4°F | Wt 233.0 lb

## 2012-10-20 DIAGNOSIS — F4321 Adjustment disorder with depressed mood: Secondary | ICD-10-CM

## 2012-10-20 NOTE — Progress Notes (Signed)
  Subjective:    Patient ID: [name], female    DOB: [birth year], 32 y.o.   MRN: [id number]  HPI 32 year old Black female who works at [employer] in [employer] hoping to have another child in the near future in today to discuss fatigue and feeling down recently. A close acquaintance who was a senior in college hanged himself. This was devastating to patient. Has never experienced suicide before. She is having lots of questions about why this happened. Husband also recently fractured his hand. He is a Emergency planning/management officer and needs to be out of work for 8 weeks. Patient tried to help  family of young man during the funeral ,etc.  She did not sleep hardly at all the first week and now feels very tired. She may be depressed. However, since she is considering another pregnancy she doesn't want to start antidepressant medication. Says she may want to speak with someone about the effects of what happened on her, etc. She may need some closure with this event.    Review of Systems     Objective:   Physical Exam Spent 25 minutes speaking with patient about issues today. I think fatigue is coming from sleep deprivation.       Assessment & Plan:  Grief reaction  Sleep deprivation  Plan: Have recommended counselor for patient and have not started her on any medication at this point.

## 2012-10-20 NOTE — Patient Instructions (Addendum)
Consider counseling for grief process.

## 2013-08-06 ENCOUNTER — Encounter (HOSPITAL_COMMUNITY): Payer: Self-pay | Admitting: *Deleted

## 2013-08-06 ENCOUNTER — Other Ambulatory Visit: Payer: Self-pay | Admitting: Obstetrics and Gynecology

## 2013-08-06 ENCOUNTER — Encounter (HOSPITAL_COMMUNITY): Payer: Self-pay | Admitting: Pharmacy Technician

## 2013-08-09 ENCOUNTER — Encounter (HOSPITAL_COMMUNITY)
Admission: RE | Disposition: A | Discharge status: Home / Self Care | Payer: Self-pay | Source: Ambulatory Visit | Attending: Obstetrics and Gynecology

## 2013-08-09 ENCOUNTER — Ambulatory Visit (HOSPITAL_COMMUNITY): Payer: BC Managed Care – PPO

## 2013-08-09 ENCOUNTER — Ambulatory Visit (HOSPITAL_COMMUNITY): Payer: BC Managed Care – PPO | Admitting: Anesthesiology

## 2013-08-09 ENCOUNTER — Encounter (HOSPITAL_COMMUNITY): Payer: Self-pay | Admitting: *Deleted

## 2013-08-09 ENCOUNTER — Ambulatory Visit (HOSPITAL_COMMUNITY)
Admission: RE | Admit: 2013-08-09 | Discharge: 2013-08-09 | Disposition: A | Discharge status: Home / Self Care | Payer: BC Managed Care – PPO | Source: Ambulatory Visit | Attending: Obstetrics and Gynecology | Admitting: Obstetrics and Gynecology

## 2013-08-09 ENCOUNTER — Encounter (HOSPITAL_COMMUNITY): Payer: BC Managed Care – PPO | Admitting: Anesthesiology

## 2013-08-09 DIAGNOSIS — L909 Atrophic disorder of skin, unspecified: Secondary | ICD-10-CM | POA: Insufficient documentation

## 2013-08-09 DIAGNOSIS — L919 Hypertrophic disorder of the skin, unspecified: Secondary | ICD-10-CM

## 2013-08-09 DIAGNOSIS — K219 Gastro-esophageal reflux disease without esophagitis: Secondary | ICD-10-CM | POA: Insufficient documentation

## 2013-08-09 DIAGNOSIS — O021 Missed abortion: Secondary | ICD-10-CM | POA: Insufficient documentation

## 2013-08-09 DIAGNOSIS — Z6838 Body mass index (BMI) 38.0-38.9, adult: Secondary | ICD-10-CM | POA: Insufficient documentation

## 2013-08-09 HISTORY — PX: DILATION AND EVACUATION: SHX1459

## 2013-08-09 HISTORY — PX: EXCISION OF SKIN TAG: SHX6270

## 2013-08-09 HISTORY — PX: OPERATIVE ULTRASOUND: SHX5996

## 2013-08-09 LAB — CBC
HCT: 35 % — ABNORMAL LOW (ref 36.0–46.0)
Hemoglobin: 11.9 g/dL — ABNORMAL LOW (ref 12.0–15.0)
MCH: 28.9 pg (ref 26.0–34.0)
MCHC: 34 g/dL (ref 30.0–36.0)
MCV: 85 fL (ref 78.0–100.0)
Platelets: 332 10*3/uL (ref 150–400)
RBC: 4.12 MIL/uL (ref 3.87–5.11)
RDW: 13.4 % (ref 11.5–15.5)
WBC: 8.3 10*3/uL (ref 4.0–10.5)

## 2013-08-09 SURGERY — DILATION AND EVACUATION, UTERUS
Anesthesia: General | Site: Vagina | Laterality: Right

## 2013-08-09 MED ORDER — LIDOCAINE HCL (CARDIAC) 20 MG/ML IV SOLN
INTRAVENOUS | Status: DC | PRN
  Administered 2013-08-09: 30 mg via INTRAVENOUS
  Administered 2013-08-09: 70 mg via INTRAVENOUS

## 2013-08-09 MED ORDER — PROPOFOL 10 MG/ML IV BOLUS
INTRAVENOUS | Status: DC | PRN
  Administered 2013-08-09: 170 mg via INTRAVENOUS

## 2013-08-09 MED ORDER — KETOROLAC TROMETHAMINE 30 MG/ML IJ SOLN
INTRAMUSCULAR | Status: DC | PRN
  Administered 2013-08-09: 30 mg via INTRAVENOUS

## 2013-08-09 MED ORDER — ONDANSETRON HCL 4 MG/2ML IJ SOLN
INTRAMUSCULAR | Status: DC | PRN
  Administered 2013-08-09: 4 mg via INTRAVENOUS

## 2013-08-09 MED ORDER — LIDOCAINE HCL 2 % IJ SOLN
INTRAMUSCULAR | Status: DC | PRN
  Administered 2013-08-09: 20 mL

## 2013-08-09 MED ORDER — FENTANYL CITRATE 0.05 MG/ML IJ SOLN
INTRAMUSCULAR | Status: AC
Start: 2013-08-09 — End: 2013-08-09
  Filled 2013-08-09: qty 2

## 2013-08-09 MED ORDER — DEXAMETHASONE SODIUM PHOSPHATE 10 MG/ML IJ SOLN
INTRAMUSCULAR | Status: AC
  Filled 2013-08-09: qty 1

## 2013-08-09 MED ORDER — FENTANYL CITRATE 0.05 MG/ML IJ SOLN
INTRAMUSCULAR | Status: AC
  Administered 2013-08-09: 50 ug via INTRAVENOUS
  Filled 2013-08-09: qty 2

## 2013-08-09 MED ORDER — DOXYCYCLINE HYCLATE 50 MG PO CAPS: 100.0000 mg | ORAL_CAPSULE | Freq: Two times a day (BID) | ORAL | Status: DC

## 2013-08-09 MED ORDER — MIDAZOLAM HCL 2 MG/2ML IJ SOLN
INTRAMUSCULAR | Status: DC | PRN
  Administered 2013-08-09 (×2): 1 mg via INTRAVENOUS

## 2013-08-09 MED ORDER — KETOROLAC TROMETHAMINE 30 MG/ML IJ SOLN
INTRAMUSCULAR | Status: AC
  Filled 2013-08-09: qty 1

## 2013-08-09 MED ORDER — ACETAMINOPHEN 160 MG/5ML PO SOLN
1000.0000 mg | Freq: Once | ORAL | Status: DC
Start: 2013-08-09 — End: 2013-08-09

## 2013-08-09 MED ORDER — ONDANSETRON HCL 4 MG/2ML IJ SOLN
INTRAMUSCULAR | Status: AC
  Filled 2013-08-09: qty 2

## 2013-08-09 MED ORDER — FENTANYL CITRATE 0.05 MG/ML IJ SOLN
INTRAMUSCULAR | Status: DC | PRN
  Administered 2013-08-09 (×2): 50 ug via INTRAVENOUS

## 2013-08-09 MED ORDER — MIDAZOLAM HCL 2 MG/2ML IJ SOLN
INTRAMUSCULAR | Status: AC
  Filled 2013-08-09: qty 2

## 2013-08-09 MED ORDER — ACETAMINOPHEN 160 MG/5ML PO SOLN
960.0000 mg | Freq: Once | ORAL | Status: AC
  Administered 2013-08-09: 960 mg via ORAL

## 2013-08-09 MED ORDER — LIDOCAINE HCL 2 % IJ SOLN
INTRAMUSCULAR | Status: AC
  Filled 2013-08-09: qty 20

## 2013-08-09 MED ORDER — ACETAMINOPHEN 160 MG/5ML PO SOLN
ORAL | Status: AC
  Filled 2013-08-09: qty 40.6

## 2013-08-09 MED ORDER — FENTANYL CITRATE 0.05 MG/ML IJ SOLN
25.0000 ug | INTRAMUSCULAR | Status: DC | PRN
  Administered 2013-08-09: 50 ug via INTRAVENOUS

## 2013-08-09 MED ORDER — DEXAMETHASONE SODIUM PHOSPHATE 10 MG/ML IJ SOLN
INTRAMUSCULAR | Status: DC | PRN
  Administered 2013-08-09: 10 mg via INTRAVENOUS

## 2013-08-09 MED ORDER — FLUMAZENIL 0.5 MG/5ML IV SOLN
INTRAVENOUS | Status: DC | PRN
  Administered 2013-08-09: 0.2 mg via INTRAVENOUS

## 2013-08-09 MED ORDER — PROPOFOL 10 MG/ML IV EMUL
INTRAVENOUS | Status: AC
  Filled 2013-08-09: qty 20

## 2013-08-09 MED ORDER — LIDOCAINE HCL (CARDIAC) 20 MG/ML IV SOLN
INTRAVENOUS | Status: AC
  Filled 2013-08-09: qty 5

## 2013-08-09 MED ORDER — LACTATED RINGERS IV SOLN
INTRAVENOUS | Status: DC
  Administered 2013-08-09 (×2): via INTRAVENOUS

## 2013-08-09 SURGICAL SUPPLY — 22 items
BLADE SURG 15 STRL LF C SS BP (BLADE) ×2 IMPLANT
BLADE SURG 15 STRL SS (BLADE) ×1
CATH ROBINSON RED A/P 16FR (CATHETERS) ×3 IMPLANT
CLOTH BEACON ORANGE TIMEOUT ST (SAFETY) ×3 IMPLANT
DECANTER SPIKE VIAL GLASS SM (MISCELLANEOUS) ×3 IMPLANT
GLOVE BIO SURGEON STRL SZ 6.5 (GLOVE) ×3 IMPLANT
GLOVE BIOGEL PI IND STRL 7.0 (GLOVE) ×4 IMPLANT
GLOVE BIOGEL PI INDICATOR 7.0 (GLOVE) ×2
GOWN STRL REUS W/TWL LRG LVL3 (GOWN DISPOSABLE) ×6 IMPLANT
KIT BERKELEY 1ST TRIMESTER 3/8 (MISCELLANEOUS) ×3 IMPLANT
NS IRRIG 1000ML POUR BTL (IV SOLUTION) ×3 IMPLANT
PACK VAGINAL MINOR WOMEN LF (CUSTOM PROCEDURE TRAY) ×3 IMPLANT
PAD OB MATERNITY 4.3X12.25 (PERSONAL CARE ITEMS) ×3 IMPLANT
PAD PREP 24X48 CUFFED NSTRL (MISCELLANEOUS) ×3 IMPLANT
SET BERKELEY SUCTION TUBING (SUCTIONS) ×3 IMPLANT
SUT CHROMIC 2 0 SH (SUTURE) ×3 IMPLANT
SYR 20CC LL (SYRINGE) ×3 IMPLANT
TOWEL OR 17X24 6PK STRL BLUE (TOWEL DISPOSABLE) ×6 IMPLANT
VACURETTE 10 RIGID CVD (CANNULA) IMPLANT
VACURETTE 7MM CVD STRL WRAP (CANNULA) IMPLANT
VACURETTE 8 RIGID CVD (CANNULA) ×3 IMPLANT
VACURETTE 9 RIGID CVD (CANNULA) IMPLANT

## 2013-08-09 NOTE — Transfer of Care (Signed)
Immediate Anesthesia Transfer of Care Note  Patient: [name]  Procedure(s) Performed: Procedure(s): DILATATION AND EVACUATION (N/A) OPERATIVE ULTRASOUND (N/A) EXCISION OF SKIN TAG (Right)  Patient Location: PACU  Anesthesia Type:General  Level of Consciousness: awake, alert , oriented and patient cooperative  Airway & Oxygen Therapy: Patient Spontanous Breathing and Patient connected to nasal cannula oxygen  Post-op Assessment:   Post vital signs: Reviewed and stable  Complications: No apparent anesthesia complications

## 2013-08-09 NOTE — H&P (Signed)
[name] is an 33 y.o. female. Presenting for D&E for missed abortion at 8 weeks  Pertinent Gynecological History: Menses: flow is moderate Bleeding: pt now with spotting Contraception: none DES exposure: denies Blood transfusions: none Sexually transmitted diseases: no past history Previous GYN Procedures: na  Last mammogram: na Date: na Last pap: normal Date: 2014 OB History: G2, P1   Menstrual History: Menarche age: 50  Patient's last menstrual period was 06/06/2013.    Past Medical History  Diagnosis Date  . GERD (gastroesophageal reflux disease)     not currently    Past Surgical History  Procedure Laterality Date  . Excision of pilonidal cyst    . Tonsillectomy    . Cesarean section      Family History  Problem Relation Age of Onset  . Hypertension Mother     Social History:  reports that she has never smoked. She has never used smokeless tobacco. She reports that she drinks alcohol. She reports that she does not use illicit drugs.  Allergies: No Known Allergies  Prescriptions prior to admission  Medication Sig Dispense Refill  . ibuprofen (ADVIL,MOTRIN) 600 MG tablet Take 600 mg by mouth every 6 (six) hours as needed for cramping.      . Prenatal Vit-Fe Fumarate-FA (PRENATAL MULTIVITAMIN) TABS tablet Take 1 tablet by mouth daily at 12 noon.        ROS  Blood pressure 121/87, pulse 80, temperature 98.2 F (36.8 C), temperature source Oral, resp. rate 18, height 5\' 5"  (1.651 m), weight 105.235 kg (232 lb), last menstrual period 06/06/2013. Physical Exam Physical Examination: General appearance - alert, well appearing, and in no distress Chest - clear to auscultation, no wheezes, rales or rhonchi, symmetric air entry Heart - normal rate and regular rhythm Abdomen - soft, nontender, nondistended, no masses or organomegaly Pelvic - normal external genitalia, vulva, vagina, cervix, uterus and adnexa, with scant spotting Extremities - peripheral pulses  normal, no pedal edema, no clubbing or cyanosis  [place] sig for missed abortion Results for orders placed during the hospital encounter of 08/09/13 (from the past 24 hour(s))  CBC     Status: Abnormal   Collection Time    08/09/13  8:07 AM      Result Value Ref Range   WBC 8.3  4.0 - 10.5 K/uL   RBC 4.12  3.87 - 5.11 MIL/uL   Hemoglobin 11.9 (*) 12.0 - 15.0 g/dL   HCT 35.0 (*) 36.0 - 46.0 %   MCV 85.0  78.0 - 100.0 fL   MCH 28.9  26.0 - 34.0 pg   MCHC 34.0  30.0 - 36.0 g/dL   RDW 13.4  11.5 - 15.5 %   Platelets 332  150 - 400 K/uL    No results found.  Assessment/Plan: Missed abortion Pt given the options of observation, cytotec or D&E Pt chose D&E She understands the risks are bleeding, infection, damage to internal organs by perforation and ashermans syndrome which can cause infertility  Clifton A 08/09/2013, 9:01 AM

## 2013-08-09 NOTE — Op Note (Signed)
Pre op DX :  missed abortion Postoperative Diagnosis: same Procedure:  dilation and evacuation Anesthesia:  MAC and local Surgeon:  Dr. Charlesetta Garibaldi Asst : none Complications : none Procedure in detail: The patient was taken to the operating room where she was given MAC anesthesia. She was placed in dorsal lithotomy position and prepped and draped in normal sterile fashion. In and out catheter was used to drain the bladder. This was examined and noted to have a 8 week size uterus with no adnexal masses. A bivalve was placed into the vagina. A tenaculum was placed on the cervix. The cervix was infiltrated with 20 cc 1% lidocaine paracervical block. The cervix then dilated with dilators up to 21.  A size 8 suction curettage was placed into the uterine cavity. A scant amount of products of conception was seen. The suction curettage was removed when a gritty texture was noted. A sharp curette was done along all walls  of the uterus. The suction curet was placed back into the uterine cavity. No further products of conception were obtained. Sponge lap and needle counts were correct. Patient back in stable condition. The patient understood to be the risks to be, but not  limited to,  Bleeding,  Infection,  damage to internal organs by perforation of the uterus and Asherman syndrome (scarring in the uterus) leading to infertility.  This was done with US guidance.

## 2013-08-09 NOTE — Discharge Instructions (Signed)
Dilation and Curettage or Vacuum Curettage, Care After °Refer to this sheet in the next few weeks. These instructions provide you with information on caring for yourself after your procedure. Your health care provider may also give you more specific instructions. Your treatment has been planned according to current medical practices, but problems sometimes occur. Call your health care provider if you have any problems or questions after your procedure. °WHAT TO EXPECT AFTER THE PROCEDURE °After your procedure, it is typical to have light cramping and bleeding. This may last for 2 days to 2 weeks after the procedure. °HOME CARE INSTRUCTIONS  °· Do not drive for 24 hours. °· Wait 1 week before returning to strenuous activities. °· Take your temperature 2 times a day for 4 days and write it down. Provide these temperatures to your health care provider if you develop a fever. °· Avoid long periods of standing. °· Avoid heavy lifting, pushing, or pulling. Do not lift anything heavier than 10 pounds (4.5 kg). °· Limit stair climbing to once or twice a day. °· Take rest periods often. °· You may resume your usual diet. °· Drink enough fluids to keep your urine clear or pale yellow. °· Your usual bowel function should return. If you have constipation, you may: °¨ Take a mild laxative with permission from your health care provider. °¨ Add fruit and bran to your diet. °¨ Drink more fluids. °· Take showers instead of baths until your health care provider gives you permission to take baths. °· Do not go swimming or use a hot tub until your health care provider approves. °· Try to have someone with you or available to you the first 24-48 hours, especially if you were given a general anesthetic. °· Do not douche, use tampons, or have sex (intercourse) for 2 weeks after the procedure. °· Only take over-the-counter or prescription medicines as directed by your health care provider. Do not take aspirin. It can cause  bleeding. °· Follow up with your health care provider as directed. °SEEK MEDICAL CARE IF:  °· You have increasing cramps or pain that is not relieved with medicine. °· You have abdominal pain that does not seem to be related to the same area of earlier cramping and pain. °· You have bad smelling vaginal discharge. °· You have a rash. °· You are having problems with any medicine. °SEEK IMMEDIATE MEDICAL CARE IF:  °· You have bleeding that is heavier than a normal menstrual period. °· You have a fever. °· You have chest pain. °· You have shortness of breath. °· You feel dizzy or feel like fainting. °· You pass out. °· You have pain in your shoulder strap area. °· You have heavy vaginal bleeding with or without blood clots. °MAKE SURE YOU:  °· Understand these instructions. °· Will watch your condition. °· Will get help right away if you are not doing well or get worse. °Document Released: 01/02/2000 Document Revised: 01/09/2013 Document Reviewed: 08/03/2012 °[employer]® Patient Information ©2015 [employer]. This information is not intended to replace advice given to you by your health care provider. Make sure you discuss any questions you have with your health care provider. °DISCHARGE INSTRUCTIONS: D&C / D&E °The following instructions have been prepared to help you care for yourself upon your return home. °  °Personal hygiene: °• Use sanitary pads for vaginal drainage, not tampons. °• Shower the day after your procedure. °• NO tub baths, pools or Jacuzzis for 2-3 weeks. °• Wipe front to back   after using the bathroom. ° °Activity and limitations: °• Do NOT drive or operate any equipment for 24 hours. The effects of anesthesia are still present and drowsiness may result. °• Do NOT rest in bed all day. °• Walking is encouraged. °• Walk up and down stairs slowly. °• You may resume your normal activity in one to two days or as indicated by your physician. ° °Sexual activity: NO intercourse for at least 2 weeks after the  procedure, or as indicated by your physician. ° °Diet: Eat a light meal as desired this evening. You may resume your usual diet tomorrow. ° °Return to work: You may resume your work activities in one to two days or as indicated by your doctor. ° °What to expect after your surgery: Expect to have vaginal bleeding/discharge for 2-3 days and spotting for up to 10 days. It is not unusual to have soreness for up to 1-2 weeks. You may have a slight burning sensation when you urinate for the first day. Mild cramps may continue for a couple of days. You may have a regular period in 2-6 weeks. ° °Call your doctor for any of the following: °• Excessive vaginal bleeding, saturating and changing one pad every hour. °• Inability to urinate 6 hours after discharge from hospital. °• Pain not relieved by pain medication. °• Fever of 100.4° F or greater. °• Unusual vaginal discharge or odor. ° ° Call for an appointment:  ° ° °Patient’s signature: ______________________ ° °Nurse’s signature ________________________ ° °Support person's signature_______________________ ° ° ° °

## 2013-08-09 NOTE — Anesthesia Preprocedure Evaluation (Addendum)
Anesthesia Evaluation  Patient identified by MRN, date of birth, ID band Patient awake    Reviewed: Allergy & Precautions, H&P , Patient's Chart, lab work & pertinent test results, reviewed documented beta blocker date and time   Airway Mallampati: II TM Distance: >3 FB Neck ROM: full    Dental no notable dental hx.    Pulmonary  breath sounds clear to auscultation  Pulmonary exam normal       Cardiovascular Rhythm:regular Rate:Normal     Neuro/Psych    GI/Hepatic   Endo/Other  Morbid obesity  Renal/GU      Musculoskeletal   Abdominal   Peds  Hematology   Anesthesia Other Findings   Reproductive/Obstetrics                          Anesthesia Physical Anesthesia Plan  ASA: III  Anesthesia Plan:    Post-op Pain Management:    Induction: Intravenous  Airway Management Planned: LMA and Mask  Additional Equipment:   Intra-op Plan:   Post-operative Plan:   Informed Consent: I have reviewed the patients History and Physical, chart, labs and discussed the procedure including the risks, benefits and alternatives for the proposed anesthesia with the patient or authorized representative who has indicated his/her understanding and acceptance.   Dental Advisory Given and Dental advisory given  Plan Discussed with: CRNA and Surgeon  Anesthesia Plan Comments: (Discussed MAC vs GA with LMA, possible sore throat, potential need to switch to ETT, N/V, pulmonary aspiration. Questions answered. )        Anesthesia Quick Evaluation

## 2013-08-10 ENCOUNTER — Encounter (HOSPITAL_COMMUNITY): Payer: Self-pay | Admitting: Obstetrics and Gynecology

## 2013-08-10 NOTE — Anesthesia Postprocedure Evaluation (Signed)
  Anesthesia Post-op Note  Patient: [name]  Procedure(s) Performed: Procedure(s) with comments: DILATATION AND EVACUATION (N/A) OPERATIVE ULTRASOUND (N/A) EXCISION OF SKIN TAG on right buttock (Right) - This procedure was added on after the patient had gone to sleep (as per Dr. Charlesetta Garibaldi) Patient is awake and responsive. Pain and nausea are reasonably well controlled. Vital signs are stable and clinically acceptable. Oxygen saturation is clinically acceptable. There are no apparent anesthetic complications at this time. Patient is ready for discharge.

## 2013-11-19 ENCOUNTER — Encounter (HOSPITAL_COMMUNITY): Payer: Self-pay | Admitting: Obstetrics and Gynecology

## 2014-02-26 ENCOUNTER — Encounter: Payer: Self-pay | Admitting: Internal Medicine

## 2014-02-26 ENCOUNTER — Ambulatory Visit (INDEPENDENT_AMBULATORY_CARE_PROVIDER_SITE_OTHER): Payer: BC Managed Care – PPO | Admitting: Internal Medicine

## 2014-02-26 VITALS — BP 118/82 | HR 85 | Temp 98.0°F | Wt 226.0 lb

## 2014-02-26 DIAGNOSIS — R519 Headache, unspecified: Secondary | ICD-10-CM

## 2014-02-26 DIAGNOSIS — R51 Headache: Secondary | ICD-10-CM

## 2014-02-26 LAB — CBC WITH DIFFERENTIAL/PLATELET
BASOS ABS: 0.1 10*3/uL (ref 0.0–0.1)
BASOS PCT: 1 % (ref 0–1)
EOS PCT: 2 % (ref 0–5)
Eosinophils Absolute: 0.2 10*3/uL (ref 0.0–0.7)
HCT: 36.6 % (ref 36.0–46.0)
Hemoglobin: 12.2 g/dL (ref 12.0–15.0)
LYMPHS PCT: 33 % (ref 12–46)
Lymphs Abs: 2.7 10*3/uL (ref 0.7–4.0)
MCH: 28.1 pg (ref 26.0–34.0)
MCHC: 33.3 g/dL (ref 30.0–36.0)
MCV: 84.3 fL (ref 78.0–100.0)
MPV: 10.2 fL (ref 8.6–12.4)
Monocytes Absolute: 0.7 10*3/uL (ref 0.1–1.0)
Monocytes Relative: 9 % (ref 3–12)
NEUTROS ABS: 4.5 10*3/uL (ref 1.7–7.7)
Neutrophils Relative %: 55 % (ref 43–77)
PLATELETS: 405 10*3/uL — AB (ref 150–400)
RBC: 4.34 MIL/uL (ref 3.87–5.11)
RDW: 13.5 % (ref 11.5–15.5)
WBC: 8.1 10*3/uL (ref 4.0–10.5)

## 2014-02-26 MED ORDER — HYDROCODONE-ACETAMINOPHEN 10-325 MG PO TABS: 1.0000 | ORAL_TABLET | Freq: Three times a day (TID) | ORAL | Status: DC | PRN

## 2014-02-26 MED ORDER — PREDNISONE 10 MG PO TABS: ORAL_TABLET | ORAL | Status: DC

## 2014-02-26 NOTE — Patient Instructions (Signed)
Take prednisone and Norco 10/325 as directed. Call if not better by Friday, February 12

## 2014-02-26 NOTE — Progress Notes (Signed)
   Subjective:    Patient ID: [name], female    DOB: 1980-07-21, 34 y.o.   MRN: [id number]  HPI  Patient in today complaining of headache in frontal area for 2 full weeks. She felt better last night but had taken a hydrocodone/APAP tablet earlier in the day. Had photophobia yesterday. Has felt slightly nauseated. No vomiting. Sometimes headache is worse when she moves her eyes downward. Last menstrual period was 2 weeks ago. Says she's not currently trying to conceive. This past summer she had a miscarriage and underwent a D&C. She had some heavy bleeding with menstrual period after that associated with a headache. Recently she went to give blood in mid January and was told she was borderline anemic. Her blood was not taken. Patient denies excessive stress. Says she's not worried anymore than usual. Says job is okay.    Review of Systems     Objective:   Physical Exam  Today weighs 226 pounds. Weight 244 pounds in February 2014. Skin warm and dry. Nodes none. PERRLA. Funduscopic exam is benign. TMs clear. Pharynx is clear. Neck is supple without thyromegaly. Chest clear. Cardiac exam regular rate and rhythm. Neurological exam: Cranial nerves II through XII are grossly intact. Sensation intact. Muscle strength is normal. Deep tendon reflexes are normal. No nystagmus.      Assessment & Plan:  Protracted headache-has some features of protracted migraine  Possible anemia  Obesity   Plan: CBC with diff. Iron and iron-binding capacity. Prednisone 10 mg tablets ( #20) going from 40 mg to 0 mg over 9 days. Norco 10/325 (#30) one by mouth every 8 hours when necessary headache. Take with food. Call if not better by Friday, February 12.  25 minutes spent with patient

## 2014-02-27 ENCOUNTER — Telehealth: Payer: Self-pay | Admitting: *Deleted

## 2014-02-27 LAB — IRON AND TIBC
%SAT: 26 % (ref 20–55)
Iron: 87 ug/dL (ref 42–145)
TIBC: 329 ug/dL (ref 250–470)
UIBC: 242 ug/dL (ref 125–400)

## 2014-03-01 NOTE — Telephone Encounter (Signed)
Reviewed labs with patient. °

## 2014-04-26 ENCOUNTER — Other Ambulatory Visit: Payer: Self-pay | Admitting: Obstetrics and Gynecology

## 2014-04-26 DIAGNOSIS — N63 Unspecified lump in unspecified breast: Secondary | ICD-10-CM

## 2014-04-30 ENCOUNTER — Other Ambulatory Visit: Payer: Self-pay | Admitting: Obstetrics and Gynecology

## 2014-04-30 ENCOUNTER — Ambulatory Visit
Admission: RE | Admit: 2014-04-30 | Discharge: 2014-04-30 | Disposition: A | Discharge status: Home / Self Care | Payer: BC Managed Care – PPO | Source: Ambulatory Visit | Attending: Obstetrics and Gynecology | Admitting: Obstetrics and Gynecology

## 2014-04-30 DIAGNOSIS — N6310 Unspecified lump in the right breast, unspecified quadrant: Secondary | ICD-10-CM

## 2014-04-30 DIAGNOSIS — N63 Unspecified lump in unspecified breast: Secondary | ICD-10-CM

## 2014-05-02 ENCOUNTER — Ambulatory Visit
Admission: RE | Admit: 2014-05-02 | Discharge: 2014-05-02 | Disposition: A | Discharge status: Home / Self Care | Payer: BC Managed Care – PPO | Source: Ambulatory Visit | Attending: Obstetrics and Gynecology | Admitting: Obstetrics and Gynecology

## 2014-05-02 DIAGNOSIS — N6310 Unspecified lump in the right breast, unspecified quadrant: Secondary | ICD-10-CM

## 2014-05-02 HISTORY — PX: BREAST BIOPSY: SHX20

## 2015-01-29 VITALS — Ht 61.0 in | Wt 228.9 lb

## 2015-01-29 DIAGNOSIS — O24419 Gestational diabetes mellitus in pregnancy, unspecified control: Secondary | ICD-10-CM | POA: Diagnosis not present

## 2015-01-29 DIAGNOSIS — Z713 Dietary counseling and surveillance: Secondary | ICD-10-CM | POA: Diagnosis not present

## 2015-01-30 NOTE — Progress Notes (Signed)
  Patient was seen on 01/29/15  for Gestational Diabetes self-management . The following learning objectives were met by the patient :   States the definition of Gestational Diabetes  States why dietary management is important in controlling blood glucose  Describes the effects of carbohydrates on blood glucose levels  Demonstrates ability to create a balanced meal plan  Demonstrates carbohydrate counting   States when to check blood glucose levels  Demonstrates proper blood glucose monitoring techniques  States the effect of stress and exercise on blood glucose levels  States the importance of limiting caffeine and abstaining from alcohol and smoking  Plan:  Aim for 2 Carb Choices per meal (30 grams) +/- 1 either way for breakfast Aim for 3 Carb Choices per meal (45 grams) +/- 1 either way from lunch and dinner Aim for 1-2 Carbs per snack Begin reading food labels for Total Carbohydrate and sugar grams of foods Consider  increasing your activity level by walking daily as tolerated Begin checking BG before breakfast and 1-2 hours after first bit of breakfast, lunch and dinner after  as directed by MD  Take medication  as directed by MD  Blood glucose monitor given: One Touch verio Flez Lot # [id number] X Exp: 10/2015 Blood glucose reading: '76mg'$ /dl  Patient instructed to monitor glucose levels: FBS: 60 - <90 1 hour: <140 2 hour: <120  Patient received the following handouts:  Nutrition Diabetes and Pregnancy  Carbohydrate Counting List  Meal Planning worksheet  Patient will be seen for follow-up as needed.

## 2015-03-26 ENCOUNTER — Other Ambulatory Visit: Payer: Self-pay | Admitting: Obstetrics and Gynecology

## 2015-04-21 ENCOUNTER — Encounter (HOSPITAL_COMMUNITY)
Admission: AD | Disposition: A | Discharge status: Home / Self Care | Payer: Self-pay | Source: Ambulatory Visit | Attending: Obstetrics & Gynecology

## 2015-04-21 ENCOUNTER — Inpatient Hospital Stay (HOSPITAL_COMMUNITY): Payer: BC Managed Care – PPO | Admitting: Anesthesiology

## 2015-04-21 ENCOUNTER — Inpatient Hospital Stay (HOSPITAL_COMMUNITY)
Admission: AD | Admit: 2015-04-21 | Discharge: 2015-04-23 | DRG: 766 | Disposition: A | Discharge status: Home / Self Care | Payer: BC Managed Care – PPO | Source: Ambulatory Visit | Attending: Obstetrics & Gynecology | Admitting: Obstetrics & Gynecology

## 2015-04-21 ENCOUNTER — Encounter (HOSPITAL_COMMUNITY): Payer: Self-pay | Admitting: *Deleted

## 2015-04-21 DIAGNOSIS — O4292 Full-term premature rupture of membranes, unspecified as to length of time between rupture and onset of labor: Secondary | ICD-10-CM | POA: Diagnosis present

## 2015-04-21 DIAGNOSIS — O9962 Diseases of the digestive system complicating childbirth: Secondary | ICD-10-CM | POA: Diagnosis present

## 2015-04-21 DIAGNOSIS — O24419 Gestational diabetes mellitus in pregnancy, unspecified control: Secondary | ICD-10-CM | POA: Diagnosis present

## 2015-04-21 DIAGNOSIS — Z8249 Family history of ischemic heart disease and other diseases of the circulatory system: Secondary | ICD-10-CM | POA: Diagnosis not present

## 2015-04-21 DIAGNOSIS — O09529 Supervision of elderly multigravida, unspecified trimester: Secondary | ICD-10-CM

## 2015-04-21 DIAGNOSIS — O35EXX0 Maternal care for other (suspected) fetal abnormality and damage, fetal genitourinary anomalies, not applicable or unspecified: Secondary | ICD-10-CM

## 2015-04-21 DIAGNOSIS — D241 Benign neoplasm of right breast: Secondary | ICD-10-CM | POA: Diagnosis present

## 2015-04-21 DIAGNOSIS — Z3A37 37 weeks gestation of pregnancy: Secondary | ICD-10-CM | POA: Diagnosis not present

## 2015-04-21 DIAGNOSIS — O24425 Gestational diabetes mellitus in childbirth, controlled by oral hypoglycemic drugs: Secondary | ICD-10-CM | POA: Diagnosis present

## 2015-04-21 DIAGNOSIS — O34211 Maternal care for low transverse scar from previous cesarean delivery: Secondary | ICD-10-CM | POA: Diagnosis present

## 2015-04-21 DIAGNOSIS — Z8042 Family history of malignant neoplasm of prostate: Secondary | ICD-10-CM | POA: Diagnosis not present

## 2015-04-21 DIAGNOSIS — Z98891 History of uterine scar from previous surgery: Secondary | ICD-10-CM

## 2015-04-21 DIAGNOSIS — O358XX0 Maternal care for other (suspected) fetal abnormality and damage, not applicable or unspecified: Secondary | ICD-10-CM

## 2015-04-21 DIAGNOSIS — A6000 Herpesviral infection of urogenital system, unspecified: Secondary | ICD-10-CM | POA: Diagnosis not present

## 2015-04-21 DIAGNOSIS — K219 Gastro-esophageal reflux disease without esophagitis: Secondary | ICD-10-CM | POA: Diagnosis present

## 2015-04-21 HISTORY — DX: Herpesviral infection, unspecified: B00.9

## 2015-04-21 LAB — CBC
HEMATOCRIT: 35.9 % — AB (ref 36.0–46.0)
Hemoglobin: 12 g/dL (ref 12.0–15.0)
MCH: 27.6 pg (ref 26.0–34.0)
MCHC: 33.4 g/dL (ref 30.0–36.0)
MCV: 82.7 fL (ref 78.0–100.0)
Platelets: 236 10*3/uL (ref 150–400)
RBC: 4.34 MIL/uL (ref 3.87–5.11)
RDW: 16 % — AB (ref 11.5–15.5)
WBC: 7.8 10*3/uL (ref 4.0–10.5)

## 2015-04-21 LAB — TYPE AND SCREEN
ABO/RH(D): O POS
Antibody Screen: NEGATIVE

## 2015-04-21 LAB — POCT FERN TEST: POCT Fern Test: POSITIVE

## 2015-04-21 LAB — GLUCOSE, CAPILLARY
GLUCOSE-CAPILLARY: 81 mg/dL (ref 65–99)
GLUCOSE-CAPILLARY: 73 mg/dL (ref 65–99)

## 2015-04-21 SURGERY — Surgical Case
Anesthesia: Spinal

## 2015-04-21 MED ORDER — DIPHENHYDRAMINE HCL 50 MG/ML IJ SOLN
INTRAMUSCULAR | Status: AC
  Administered 2015-04-21: 12.5 mg via INTRAVENOUS
  Filled 2015-04-21: qty 1

## 2015-04-21 MED ORDER — KETOROLAC TROMETHAMINE 30 MG/ML IJ SOLN
30.0000 mg | Freq: Four times a day (QID) | INTRAMUSCULAR | Status: AC | PRN
  Administered 2015-04-21: 30 mg via INTRAMUSCULAR

## 2015-04-21 MED ORDER — MENTHOL 3 MG MT LOZG: 1.0000 | LOZENGE | OROMUCOSAL | Status: DC | PRN

## 2015-04-21 MED ORDER — ONDANSETRON HCL 4 MG/2ML IJ SOLN
INTRAMUSCULAR | Status: DC | PRN
  Administered 2015-04-21: 4 mg via INTRAVENOUS

## 2015-04-21 MED ORDER — KETOROLAC TROMETHAMINE 30 MG/ML IJ SOLN
30.0000 mg | Freq: Four times a day (QID) | INTRAMUSCULAR | Status: AC | PRN
  Administered 2015-04-21 – 2015-04-22 (×2): 30 mg via INTRAVENOUS
  Filled 2015-04-21 (×2): qty 1

## 2015-04-21 MED ORDER — DIPHENHYDRAMINE HCL 25 MG PO CAPS
25.0000 mg | ORAL_CAPSULE | ORAL | Status: DC | PRN
  Filled 2015-04-21: qty 1

## 2015-04-21 MED ORDER — MEPERIDINE HCL 25 MG/ML IJ SOLN
INTRAMUSCULAR | Status: AC
  Filled 2015-04-21: qty 1

## 2015-04-21 MED ORDER — NALOXONE HCL 0.4 MG/ML IJ SOLN: 0.4000 mg | INTRAMUSCULAR | Status: DC | PRN

## 2015-04-21 MED ORDER — LACTATED RINGERS IV SOLN
INTRAVENOUS | Status: DC
  Administered 2015-04-21 (×2): via INTRAVENOUS

## 2015-04-21 MED ORDER — OXYTOCIN 10 UNIT/ML IJ SOLN
40.0000 [IU] | INTRAVENOUS | Status: DC | PRN
  Administered 2015-04-21: 40 [IU] via INTRAVENOUS

## 2015-04-21 MED ORDER — FENTANYL CITRATE (PF) 100 MCG/2ML IJ SOLN
INTRAMUSCULAR | Status: AC
  Filled 2015-04-21: qty 2

## 2015-04-21 MED ORDER — NALBUPHINE HCL 10 MG/ML IJ SOLN: 5.0000 mg | Freq: Once | INTRAMUSCULAR | Status: DC | PRN

## 2015-04-21 MED ORDER — OXYTOCIN 10 UNIT/ML IJ SOLN: 2.5000 [IU]/h | INTRAMUSCULAR | Status: AC

## 2015-04-21 MED ORDER — NALBUPHINE HCL 10 MG/ML IJ SOLN: 5.0000 mg | INTRAMUSCULAR | Status: DC | PRN

## 2015-04-21 MED ORDER — ONDANSETRON HCL 4 MG/2ML IJ SOLN
INTRAMUSCULAR | Status: AC
  Filled 2015-04-21: qty 2

## 2015-04-21 MED ORDER — SCOPOLAMINE 1 MG/3DAYS TD PT72: 1.0000 | MEDICATED_PATCH | Freq: Once | TRANSDERMAL | Status: DC

## 2015-04-21 MED ORDER — LACTATED RINGERS IV SOLN
INTRAVENOUS | Status: DC | PRN
  Administered 2015-04-21: 10:00:00 via INTRAVENOUS

## 2015-04-21 MED ORDER — MORPHINE SULFATE (PF) 0.5 MG/ML IJ SOLN
INTRAMUSCULAR | Status: DC | PRN
  Administered 2015-04-21: .2 mg via INTRATHECAL

## 2015-04-21 MED ORDER — SIMETHICONE 80 MG PO CHEW
80.0000 mg | CHEWABLE_TABLET | Freq: Three times a day (TID) | ORAL | Status: DC
  Administered 2015-04-21 – 2015-04-23 (×6): 80 mg via ORAL
  Filled 2015-04-21 (×5): qty 1

## 2015-04-21 MED ORDER — SIMETHICONE 80 MG PO CHEW
80.0000 mg | CHEWABLE_TABLET | ORAL | Status: DC | PRN
  Filled 2015-04-21: qty 1

## 2015-04-21 MED ORDER — BUPIVACAINE IN DEXTROSE 0.75-8.25 % IT SOLN
INTRATHECAL | Status: DC | PRN
Start: 2015-04-21 — End: 2015-04-21
  Administered 2015-04-21: 1.6 mL via INTRATHECAL

## 2015-04-21 MED ORDER — PRENATAL MULTIVITAMIN CH
1.0000 | ORAL_TABLET | Freq: Every day | ORAL | Status: DC
  Administered 2015-04-22 – 2015-04-23 (×2): 1 via ORAL
  Filled 2015-04-21 (×2): qty 1

## 2015-04-21 MED ORDER — MORPHINE SULFATE (PF) 0.5 MG/ML IJ SOLN
INTRAMUSCULAR | Status: AC
  Filled 2015-04-21: qty 10

## 2015-04-21 MED ORDER — NALOXONE HCL 2 MG/2ML IJ SOSY
1.0000 ug/kg/h | PREFILLED_SYRINGE | INTRAMUSCULAR | Status: DC | PRN
  Filled 2015-04-21: qty 2

## 2015-04-21 MED ORDER — SODIUM CHLORIDE 0.9% FLUSH: 3.0000 mL | INTRAVENOUS | Status: DC | PRN

## 2015-04-21 MED ORDER — DIPHENHYDRAMINE HCL 25 MG PO CAPS
25.0000 mg | ORAL_CAPSULE | Freq: Four times a day (QID) | ORAL | Status: DC | PRN
  Administered 2015-04-22: 25 mg via ORAL
  Filled 2015-04-21: qty 1

## 2015-04-21 MED ORDER — DIPHENHYDRAMINE HCL 50 MG/ML IJ SOLN
12.5000 mg | INTRAMUSCULAR | Status: DC | PRN
  Administered 2015-04-21: 12.5 mg via INTRAVENOUS

## 2015-04-21 MED ORDER — WITCH HAZEL-GLYCERIN EX PADS: 1.0000 "application " | MEDICATED_PAD | CUTANEOUS | Status: DC | PRN

## 2015-04-21 MED ORDER — ACETAMINOPHEN 325 MG PO TABS
650.0000 mg | ORAL_TABLET | ORAL | Status: DC | PRN
  Administered 2015-04-23: 650 mg via ORAL
  Filled 2015-04-21: qty 2

## 2015-04-21 MED ORDER — HYDROMORPHONE HCL 1 MG/ML IJ SOLN: 0.2500 mg | INTRAMUSCULAR | Status: DC | PRN

## 2015-04-21 MED ORDER — CITRIC ACID-SODIUM CITRATE 334-500 MG/5ML PO SOLN
30.0000 mL | Freq: Once | ORAL | Status: AC
  Administered 2015-04-21: 30 mL via ORAL
  Filled 2015-04-21: qty 15

## 2015-04-21 MED ORDER — TETANUS-DIPHTH-ACELL PERTUSSIS 5-2.5-18.5 LF-MCG/0.5 IM SUSP: 0.5000 mL | Freq: Once | INTRAMUSCULAR | Status: DC

## 2015-04-21 MED ORDER — CEFAZOLIN SODIUM-DEXTROSE 2-4 GM/100ML-% IV SOLN: 2.0000 g | Freq: Once | INTRAVENOUS | Status: DC

## 2015-04-21 MED ORDER — OXYTOCIN 10 UNIT/ML IJ SOLN
INTRAMUSCULAR | Status: AC
  Filled 2015-04-21: qty 4

## 2015-04-21 MED ORDER — NALBUPHINE HCL 10 MG/ML IJ SOLN
5.0000 mg | INTRAMUSCULAR | Status: DC | PRN
  Administered 2015-04-21: 5 mg via INTRAVENOUS
  Filled 2015-04-21: qty 1

## 2015-04-21 MED ORDER — MEPERIDINE HCL 25 MG/ML IJ SOLN: 6.2500 mg | INTRAMUSCULAR | Status: DC | PRN

## 2015-04-21 MED ORDER — SENNOSIDES-DOCUSATE SODIUM 8.6-50 MG PO TABS
2.0000 | ORAL_TABLET | ORAL | Status: DC
  Administered 2015-04-22 (×2): 2 via ORAL
  Filled 2015-04-21 (×2): qty 2

## 2015-04-21 MED ORDER — SIMETHICONE 80 MG PO CHEW
80.0000 mg | CHEWABLE_TABLET | ORAL | Status: DC
  Administered 2015-04-22 (×2): 80 mg via ORAL
  Filled 2015-04-21 (×2): qty 1

## 2015-04-21 MED ORDER — ONDANSETRON HCL 4 MG/2ML IJ SOLN
4.0000 mg | Freq: Three times a day (TID) | INTRAMUSCULAR | Status: DC | PRN
  Administered 2015-04-21: 4 mg via INTRAVENOUS
  Filled 2015-04-21: qty 2

## 2015-04-21 MED ORDER — DIBUCAINE 1 % RE OINT: 1.0000 "application " | TOPICAL_OINTMENT | RECTAL | Status: DC | PRN

## 2015-04-21 MED ORDER — ZOLPIDEM TARTRATE 5 MG PO TABS: 5.0000 mg | ORAL_TABLET | Freq: Every evening | ORAL | Status: DC | PRN

## 2015-04-21 MED ORDER — FAMOTIDINE IN NACL 20-0.9 MG/50ML-% IV SOLN
20.0000 mg | Freq: Once | INTRAVENOUS | Status: AC
  Administered 2015-04-21: 20 mg via INTRAVENOUS
  Filled 2015-04-21: qty 50

## 2015-04-21 MED ORDER — LACTATED RINGERS IV SOLN
INTRAVENOUS | Status: DC
  Administered 2015-04-21: 1 mL via INTRAVENOUS

## 2015-04-21 MED ORDER — KETOROLAC TROMETHAMINE 30 MG/ML IJ SOLN
INTRAMUSCULAR | Status: AC
Start: 2015-04-21 — End: 2015-04-21
  Administered 2015-04-21: 30 mg via INTRAMUSCULAR
  Filled 2015-04-21: qty 1

## 2015-04-21 MED ORDER — IBUPROFEN 600 MG PO TABS
600.0000 mg | ORAL_TABLET | Freq: Four times a day (QID) | ORAL | Status: DC
  Administered 2015-04-22 – 2015-04-23 (×6): 600 mg via ORAL
  Filled 2015-04-21 (×6): qty 1

## 2015-04-21 MED ORDER — MEPERIDINE HCL 25 MG/ML IJ SOLN
INTRAMUSCULAR | Status: DC | PRN
  Administered 2015-04-21 (×2): 12.5 mg via INTRAVENOUS

## 2015-04-21 MED ORDER — LANOLIN HYDROUS EX OINT
1.0000 | TOPICAL_OINTMENT | CUTANEOUS | Status: DC | PRN
Start: 2015-04-21 — End: 2015-04-23

## 2015-04-21 MED ORDER — CEFAZOLIN SODIUM-DEXTROSE 2-4 GM/100ML-% IV SOLN
2.0000 g | INTRAVENOUS | Status: AC
  Administered 2015-04-21: 2 g via INTRAVENOUS
  Filled 2015-04-21: qty 100

## 2015-04-21 MED ORDER — PHENYLEPHRINE 8 MG IN D5W 100 ML (0.08MG/ML) PREMIX OPTIME
INJECTION | INTRAVENOUS | Status: AC
  Filled 2015-04-21: qty 100

## 2015-04-21 MED ORDER — PHENYLEPHRINE 8 MG IN D5W 100 ML (0.08MG/ML) PREMIX OPTIME
INJECTION | INTRAVENOUS | Status: DC | PRN
  Administered 2015-04-21: 60 ug/min via INTRAVENOUS

## 2015-04-21 MED ORDER — LACTATED RINGERS IV BOLUS (SEPSIS): 1000.0000 mL | Freq: Once | INTRAVENOUS | Status: DC

## 2015-04-21 MED ORDER — FENTANYL CITRATE (PF) 100 MCG/2ML IJ SOLN
INTRAMUSCULAR | Status: DC | PRN
  Administered 2015-04-21: 10 ug via INTRATHECAL

## 2015-04-21 SURGICAL SUPPLY — 36 items
CHLORAPREP W/TINT 26ML (MISCELLANEOUS) ×2 IMPLANT
CLAMP CORD UMBIL (MISCELLANEOUS) IMPLANT
CLOTH BEACON ORANGE TIMEOUT ST (SAFETY) ×2 IMPLANT
DERMABOND ADVANCED (GAUZE/BANDAGES/DRESSINGS) ×1
DERMABOND ADVANCED .7 DNX12 (GAUZE/BANDAGES/DRESSINGS) ×1 IMPLANT
DRAPE C SECTION CLR SCREEN (DRAPES) IMPLANT
DRSG OPSITE POSTOP 4X10 (GAUZE/BANDAGES/DRESSINGS) ×2 IMPLANT
ELECT REM PT RETURN 9FT ADLT (ELECTROSURGICAL) ×2
ELECTRODE REM PT RTRN 9FT ADLT (ELECTROSURGICAL) ×1 IMPLANT
EXTRACTOR VACUUM M CUP 4 TUBE (SUCTIONS) IMPLANT
GLOVE BIOGEL PI IND STRL 7.0 (GLOVE) ×3 IMPLANT
GLOVE BIOGEL PI INDICATOR 7.0 (GLOVE) ×3
GLOVE SURG SS PI 6.5 STRL IVOR (GLOVE) ×2 IMPLANT
GOWN STRL REUS W/TWL LRG LVL3 (GOWN DISPOSABLE) ×4 IMPLANT
KIT ABG SYR 3ML LUER SLIP (SYRINGE) IMPLANT
LIQUID BAND (GAUZE/BANDAGES/DRESSINGS) ×2 IMPLANT
NEEDLE HYPO 25X5/8 SAFETYGLIDE (NEEDLE) IMPLANT
NS IRRIG 1000ML POUR BTL (IV SOLUTION) ×2 IMPLANT
PACK C SECTION WH (CUSTOM PROCEDURE TRAY) ×2 IMPLANT
PAD OB MATERNITY 4.3X12.25 (PERSONAL CARE ITEMS) ×2 IMPLANT
PENCIL SMOKE EVAC W/HOLSTER (ELECTROSURGICAL) ×2 IMPLANT
RTRCTR C-SECT PINK 25CM LRG (MISCELLANEOUS) ×2 IMPLANT
SPONGE LAP 18X18 X RAY DECT (DISPOSABLE) ×2 IMPLANT
SUT CHROMIC 1 CTX 36 (SUTURE) IMPLANT
SUT CHROMIC 2 0 CT 1 (SUTURE) ×2 IMPLANT
SUT MON AB 4-0 PS1 27 (SUTURE) ×2 IMPLANT
SUT PLAIN 1 NONE 54 (SUTURE) IMPLANT
SUT PLAIN 2 0 (SUTURE)
SUT PLAIN 2 0 XLH (SUTURE) ×2 IMPLANT
SUT PLAIN ABS 2-0 CT1 27XMFL (SUTURE) IMPLANT
SUT VIC AB 0 CTX 36 (SUTURE) ×2
SUT VIC AB 0 CTX36XBRD ANBCTRL (SUTURE) ×2 IMPLANT
SUT VIC AB 1 CTX 36 (SUTURE) ×2
SUT VIC AB 1 CTX36XBRD ANBCTRL (SUTURE) ×2 IMPLANT
TOWEL OR 17X24 6PK STRL BLUE (TOWEL DISPOSABLE) ×2 IMPLANT
TRAY FOLEY CATH SILVER 14FR (SET/KITS/TRAYS/PACK) ×2 IMPLANT

## 2015-04-21 NOTE — Anesthesia Procedure Notes (Signed)
Spinal Patient location during procedure: OR Start time: 04/21/2015 9:38 AM End time: 04/21/2015 9:42 AM Staffing Anesthesiologist: Duane Boston Performed by: anesthesiologist  Preanesthetic Checklist Completed: patient identified, surgical consent, pre-op evaluation, timeout performed, IV checked, risks and benefits discussed and monitors and equipment checked Spinal Block Patient position: sitting Prep: DuraPrep Patient monitoring: cardiac monitor, continuous pulse ox and blood pressure Approach: midline Location: L2-3 Injection technique: single-shot Needle Needle type: Pencan  Needle gauge: 24 G Needle length: 9 cm Additional Notes Functioning IV was confirmed and monitors were applied. Sterile prep and drape, including hand hygiene and sterile gloves were used. The patient was positioned and the spine was prepped. The skin was anesthetized with lidocaine.  Free flow of clear CSF was obtained prior to injecting local anesthetic into the CSF.  The spinal needle aspirated freely following injection.  The needle was carefully withdrawn.  The patient tolerated the procedure well.

## 2015-04-21 NOTE — Op Note (Addendum)
DATE OF SURGERY: 4/32017   PREOP DIAGNOSIS:  1. 37 week 1 day  EGA intrauterine pregnancy 2. Premature rupture of membranes.  3.  1 cm dilated.  4.  1 prior cesarean section, desiring a repeat Cesarean section.   5.  Gestational diabetes mellitus.  6.  Fetus with history of left pylectasis, pediatrics notified.   POSTOP DIAGNOSIS: Same as above.  PROCEDURE: Repeat low uterine segment transverse cesarean section via Pfannenstiel incision.     SURGEON: Dr.  Johny Sax Alesia Richards  ASSISTANT: Donnel Saxon, CNM  ANESTHESIA: Spinal  COMPLICATIONS: None  FINDINGS: Viable female infant in cephalic presentation, DOP, Apgar scores of 8 and 9. Normal uterus and fallopian tubes and ovaries bilaterally.    EBL: 500 cc  IV FLUID: 1500 cc LR   URINE OUTPUT: 450 cc concentrated urine  INDICATIONS: 35 y/o P1 who presented with premature rupture of membranes.  She had had one prior cesarean sectionand she desired a repeat cesarean section delivery.  She was consented for the procedure after explaining risks benefits and alternatives of the procedure.    PROCEDURE:   Informed consent was obtained from the patient to undergo the procedure. She was taken to the operating room where her spinal anesthesia was found to be adequate. She was prepped and draped in the usual sterile fashion and a Foley catheter was placed. She received 2 g of IV Ancef preoperatively. A Pfannenstiel incision was made with the scalpel and the incision extended through the subcutaneous layer and also the fascia with the bovie. Small perforators in the subcutaneous layer were contained with the Bovie. The fascia was nicked in the midline and then was further separated from the rectus muscles bilaterally using Mayo scissors. Kochers were placed inferiorly and then superiorly to allow further separation of fascia from the rectus muscles.  The peritoneal cavity was entered bluntly with the fingers. The Alexis retractor was placed in. The bladder flap  was created using Metzenbaum scissors.   The uterus was incised with a scalpel and the incision extended bluntly bilaterally with fingers. Encore delivery of head was performed then remaining membranes forebag further ruptured and rest of the body was delivered with abdominal pressure.   She delivered a viable female infant, apgar scores 8.9.  The cord was clamped and cut. Cord blood was collected.    The uterus was not exteriorized.  The placenta was delivered with gentle traction on the umbilical cord. The edges of the uterus was grasped with Allis clamps and also T. Clamps. The uterus was cleared of clots and debris with a lap.  The incision uterine incision was closed with #1 Vicryl in a running locked stitch. An imbricating layer of came stitch was placed over the initial closure.  Irrigation was applied and suctioned out. Excellent hemostasis was noted over the incision.  Small bleeder just above bladder was contained with bovie and hemostat after pressure did not work.    The muscles were then reapproximated using chromic suture.  Fascia was closed using 0 Vicryl in a running stitch. The subcutaneous layer was irrigated and suctioned out. Small perforators were contained with the bovie.  The subcutaneous was closed over using 1-0 plain in interrupted stitches. The skin was closed using 4-0 Monocryl. Dermabond was applied. Honeycomb was then applied. The patient was then cleaned and she was taken to the recovery room in stable condition. The neonate was also taken to the nursery in stable condition.   SPECIMEN: Umbilical cord blood  DISPOSITION: TO  PACU, STABLE.

## 2015-04-21 NOTE — H&P (Signed)
[name] is a 35 y.o. female, G3P1011 at 59 1/7 weeks, presenting for SROM at 0543, with UCs starting after that.  Fluid was clear, denies bleeding, reports +FM.  Patient scheduled for repeat C/S (no BTL) on 4/17.  She is GDM, on glyburide until 04/18/15, when is was stopped due to low CBGs.  Patient Active Problem List   Diagnosis Date Noted  . AMA (advanced maternal age) multigravida 35+ 04/21/2015  . GDM (gestational diabetes mellitus) 04/21/2015  . GERD (gastroesophageal reflux disease) 04/21/2015  . Fibroadenoma of right breast 04/21/2015  . 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 Previous cesarean section 04/21/2015  . Pyelectasis of fetus on prenatal ultrasound--left, noted at  04/21/2015  . Obesity 10/12/2010  No HSV prodrome or lesions noted.  History of present pregnancy: Patient entered care at 6 1/7 weeks.   EDC of 05/11/15 was established by [place] at 6 1/7 weeks.   Anatomy scan:  21 1/7 weeks, with limited anatomy and an anterior placenta, EFW 58%ile, normal fluid, cervix 4.0  Additional [place] evaluations:  25 3/7 weeks--EFW 2+1, 83%ile, completion of anatomy, normal fluid, cervix closed, vtx.  28 5/7 weeks--EFW 1459 gm, 73%ile, AFI 65%ile, vtx, cervix 4.5  32 2/7 weeks--EFW 4+7, 48%ile, BPP 8/8, AFI 65%ile, vtx, cervix closed. 33 4/7 weeks--BPP 8/8, vtx, cervix 4.1, AFI 55%ile, pyelectasis of left kidney noted, 1.1 cm.  34 4/7 weeks--EFW 6+3, 82.7%ile, vtx,  AFI 16.48, 60%ile, BPP 8/8, vtx. 35 4/7 weeks--BPP 8/8, AFI 65%ile, vtx 36 5/7 weeks--BPP 8/8, AFI 25%ile, vtx.  Significant prenatal events:  Vaginal bleeding at 8 weeks.  Normal Panorama, female. Planned repeat C/S from early pregnancy.  Abnormal early glucola, abnormal 3 hour GTT.  Started Glyburide 2.5 mg q evening at 28 5/7 weeks.  Started on hemorrhoid rx at 31 weeks.  Followed with BPPs weekly from 32 weeks.  Fetal pyelectasis of left kidney noted at 33 4/7 weeks, 1.1 cm, but not reported on f/u US.  TDAP 02/06/15, received flu  vaccine at work site in 2016.  Started on Protonox at 36 weeks due to reflux.  CBGs were low at 36 weeks--instructed to stop glyburide.  Has not checked CBGs this week.  GBS negative. Did not take Valtrex for suppressive tx.  No lesions or prodrome noted during pregnancy. Last evaluation:  04/18/15--BP 102/70, weight 222.  Glyburide stopped due to low CBGs.  No lesions or prodrome.  OB History    Gravida Para Term Preterm AB TAB SAB Ectopic Multiple Living   3 1 1  1  1   1     2010--[facility] due to breech, GDM, 39 weeks, 4+13, delivered at [facility]. ? Year--SAB  Past Medical History  Diagnosis Date  . GERD (gastroesophageal reflux disease)     not currently  . Gestational diabetes mellitus (GDM), antepartum    Past Surgical History  Procedure Laterality Date  . Excision of pilonidal cyst    . Tonsillectomy    . Cesarean section    . Dilation and evacuation N/A 08/09/2013    Procedure: DILATATION AND EVACUATION;  Surgeon: Betsy Coder, MD;  Location: [facility];  Service: Gynecology;  Laterality: N/A;  . Operative ultrasound N/A 08/09/2013    Procedure: OPERATIVE ULTRASOUND;  Surgeon: Betsy Coder, MD;  Location: [facility];  Service: Gynecology;  Laterality: N/A;  . Excision of skin tag Right 08/09/2013    Procedure: EXCISION OF SKIN TAG on right buttock;  Surgeon: Betsy Coder, MD;  Location: [facility];  Service: Gynecology;  Laterality: Right;  This procedure was added on after the patient had gone to sleep (as per Dr. Charlesetta Garibaldi)   Family History: family history includes Hypertension in her mother.  Prostate cancer in father, Mother sarcoidosis  Social History:  reports that she has never smoked. She has never used smokeless tobacco. She reports that she drinks alcohol. She reports that she does not use illicit drugs. Patient is African American, of the [place], college-educated, employed as Web designer, married to [place], who is involved and supportive.   Prenatal  Transfer Tool  Maternal Diabetes: Yes:  Diabetes Type:  Insulin/Medication controlled--on Glyburide until 04/18/15 Genetic Screening: Normal Panorama Maternal Ultrasounds/Referrals: Normal Fetal Ultrasounds or other Referrals:  None, Other: Left renal pyelectasis noted at 33 4/7 weeks, 1.1 cm. Maternal Substance Abuse:  No Significant Maternal Medications:  None--stopped Glyburide 3/31/1/7 due to low CBGs Significant Maternal Lab Results: Lab values include: Group B Strep negative  TDAP 02/06/15 Flu 2016 at work  ROS:  Leaking clear fluid, regular mild UCs  No Known Allergies   Dilation: 1 Exam by:: Windy Kalata, CNM Blood pressure 138/82, pulse 82, temperature 97.7 F (36.5 C), temperature source Oral, resp. rate 18, height 5\' 5"  (1.651 m), weight 98.884 kg (218 lb), unknown if currently breastfeeding.  Chest clear Heart RRR without murmur Abd gravid, NT, FH 38 weeks Pelvic: Leaking clear fluid, cervix 1 cm, 50%, vtx, -2.  No HSV lesions or prodrome. Ext: WNL  FHR: Category 1 UCs:  q 2-4 min, mild  Prenatal labs: ABO, Rh:  O+ Antibody:  Neg Rubella:  Immune RPR:   NR HBsAg:   Neg HIV:   NR GBS:  Negative  Sickle cell/Hgb electrophoresis:  AA Pap:  WNL 09/07/13 GC:  Negative 09/16/14 Chlamydia:  Negative 10/17/14 Genetic screenings:  Normal Panorama Glucola:  Elevated at 143, abnormal 3 hour GTT  Other:   Hgb 11.1 at NOB, 10.2 at 28 weeks  Results for orders placed or performed during the hospital encounter of 04/21/15 (from the past 24 hour(s))  Fern Test     Status: None   Collection Time: 04/21/15  7:30 AM  Result Value Ref Range   POCT Fern Test Positive = ruptured amniotic membanes   Glucose, capillary     Status: None   Collection Time: 04/21/15  7:42 AM  Result Value Ref Range   Glucose-Capillary 81 65 - 99 mg/dL  CBC     Status: Abnormal   Collection Time: 04/21/15  7:56 AM  Result Value Ref Range   WBC 7.8 4.0 - 10.5 K/uL   RBC 4.34 3.87 - 5.11 MIL/uL    Hemoglobin 12.0 12.0 - 15.0 g/dL   HCT 35.9 (L) 36.0 - 46.0 %   MCV 82.7 78.0 - 100.0 fL   MCH 27.6 26.0 - 34.0 pg   MCHC 33.4 30.0 - 36.0 g/dL   RDW 16.0 (H) 11.5 - 15.5 %   Platelets 236 150 - 400 K/uL      Assessment/Plan: IUP at 37 1/7 weeks SROM at 0543 Previous C/S, scheduled for repeat 05/05/15, no BTL GBS negative GDM, previously on glyburide, off since 04/18/15. Left fetal renal pyelectasis   Plan: Admit to [facility] per consult with Dr. Alesia Richards Routine [facility] pre-op orders Risks and benefits of cesarean were reviewed with patient and family, including anesthesia, bleeding, infection, and damage to other organs.  Patient and family seem to understand these risks and are in agreement with proceeding with  cesarean.  She declines sterilization. Will inform nursery team of FRP finding. Ancef on call to OR.   Nira Visscher, [place], CNM, [place] 04/21/2015, 8:16 AM

## 2015-04-21 NOTE — Transfer of Care (Signed)
Immediate Anesthesia Transfer of Care Note  Patient: [name]  Procedure(s) Performed: Procedure(s): CESAREAN SECTION (N/A)  Patient Location: PACU  Anesthesia Type:Spinal  Level of Consciousness: awake, alert , oriented and patient cooperative  Airway & Oxygen Therapy: Patient Spontanous Breathing  Post-op Assessment: Report given to RN and Post -op Vital signs reviewed and stable  Post vital signs: Reviewed and stable  Last Vitals:  Filed Vitals:   04/21/15 0715  BP: 138/82  Pulse: 82  Temp: 36.5 C  Resp: 18    Complications: No apparent anesthesia complications

## 2015-04-21 NOTE — Anesthesia Preprocedure Evaluation (Addendum)
Anesthesia Evaluation  Patient identified by MRN, date of birth, ID band Patient awake    Reviewed: Allergy & Precautions, NPO status , Patient's Chart, lab work & pertinent test results  History of Anesthesia Complications Negative for: history of anesthetic complications  Airway Mallampati: I  TM Distance: >3 FB Neck ROM: Full    Dental  (+) Teeth Intact, Dental Advisory Given   Pulmonary neg pulmonary ROS,    Pulmonary exam normal        Cardiovascular negative cardio ROS Normal cardiovascular exam     Neuro/Psych negative neurological ROS  negative psych ROS   GI/Hepatic GERD  ,  Endo/Other  diabetes, Gestational  Renal/GU negative Renal ROS     Musculoskeletal negative musculoskeletal ROS (+)   Abdominal   Peds  Hematology negative hematology ROS (+)   Anesthesia Other Findings   Reproductive/Obstetrics (+) Pregnancy                            Anesthesia Physical Anesthesia Plan  ASA: III  Anesthesia Plan: Spinal   Post-op Pain Management:    Induction:   Airway Management Planned: Simple Face Mask  Additional Equipment:   Intra-op Plan:   Post-operative Plan:   Informed Consent: I have reviewed the patients History and Physical, chart, labs and discussed the procedure including the risks, benefits and alternatives for the proposed anesthesia with the patient or authorized representative who has indicated his/her understanding and acceptance.   Dental advisory given  Plan Discussed with: Anesthesiologist  Anesthesia Plan Comments:        Anesthesia Quick Evaluation

## 2015-04-21 NOTE — Anesthesia Postprocedure Evaluation (Signed)
Anesthesia Post Note  Patient: ANNEBELLE FICHTER  Procedure(s) Performed: Procedure(s) (LRB): CESAREAN SECTION (N/A)  Patient location during evaluation: PACU Anesthesia Type: Spinal Level of consciousness: oriented and awake and alert Pain management: pain level controlled Vital Signs Assessment: post-procedure vital signs reviewed and stable Respiratory status: spontaneous breathing, respiratory function stable and patient connected to nasal cannula oxygen Cardiovascular status: blood pressure returned to baseline and stable Postop Assessment: no headache and no backache Anesthetic complications: no    Last Vitals:  Filed Vitals:   04/21/15 1130 04/21/15 1144  BP: 118/70 109/67  Pulse: 66 83  Temp:    Resp: 15 19    Last Pain:  Filed Vitals:   04/21/15 1201  PainSc: 5                  Raysha Tilmon DANIEL

## 2015-04-21 NOTE — Brief Op Note (Signed)
04/21/2015  10:56 AM  PATIENT:  [name]  35 y.o. female  PRE-OPERATIVE DIAGNOSIS:  ruptured membranes, previous cesarean section, desires repeat, 37 weeks 1 day EGA.   POST-OPERATIVE DIAGNOSIS:  Same as above  PROCEDURE:  Procedure(s): CESAREAN SECTION (N/A)  SURGEON:  Surgeon(s) and Role:    * Waymon Amato, MD - Primary  PHYSICIAN ASSISTANT:   ASSISTANTS: Donnel Saxon, CNM.    ANESTHESIA:   spinal  EBL:  Total I/O In: 1500 [I.V.:1500] Out: 950 [Urine:450; Blood:500]  BLOOD ADMINISTERED:none  DRAINS: none   LOCAL MEDICATIONS USED:  NONE  SPECIMEN:  Source of Specimen:  Cord blood.   DISPOSITION OF SPECIMEN:  Lab.   COUNTS:  YES  TOURNIQUET:  * No tourniquets in log *  DICTATION: .Dragon Dictation  PLAN OF CARE: Admit to inpatient   PATIENT DISPOSITION:  PACU - hemodynamically stable.   Delay start of Pharmacological VTE agent (>24hrs) due to surgical blood loss or risk of bleeding: not applicable

## 2015-04-21 NOTE — MAU Note (Signed)
Water broke at 0543, clear fluid. For repeat C/S on 4/17. States is gestational diabetic, but Dr. Raphael Gibney D/C'd glyburide last week.

## 2015-04-21 NOTE — MAU Note (Signed)
For repeat c/s; co-ordinator, house coverage, central nursery, and anes notified

## 2015-04-22 ENCOUNTER — Encounter (HOSPITAL_COMMUNITY): Payer: Self-pay | Admitting: Obstetrics & Gynecology

## 2015-04-22 DIAGNOSIS — Z98891 History of uterine scar from previous surgery: Secondary | ICD-10-CM

## 2015-04-22 LAB — CBC
HEMATOCRIT: 31.8 % — AB (ref 36.0–46.0)
HEMOGLOBIN: 10.6 g/dL — AB (ref 12.0–15.0)
MCH: 27.6 pg (ref 26.0–34.0)
MCHC: 33.3 g/dL (ref 30.0–36.0)
MCV: 82.8 fL (ref 78.0–100.0)
Platelets: 219 10*3/uL (ref 150–400)
RBC: 3.84 MIL/uL — AB (ref 3.87–5.11)
RDW: 16.2 % — ABNORMAL HIGH (ref 11.5–15.5)
WBC: 9.6 10*3/uL (ref 4.0–10.5)

## 2015-04-22 LAB — RPR: RPR: NONREACTIVE

## 2015-04-22 LAB — HIV ANTIBODY (ROUTINE TESTING W REFLEX): HIV SCREEN 4TH GENERATION: NONREACTIVE

## 2015-04-22 LAB — GLUCOSE, CAPILLARY
GLUCOSE-CAPILLARY: 69 mg/dL (ref 65–99)
GLUCOSE-CAPILLARY: 106 mg/dL — AB (ref 65–99)

## 2015-04-22 LAB — BIRTH TISSUE RECOVERY COLLECTION (PLACENTA DONATION)

## 2015-04-22 NOTE — Progress Notes (Signed)
Subjective: Postpartum Day 1: Cesarean Delivery Patient reports tolerating PO, + flatus, + BM and no problems voiding.    Objective: Vital signs in last 24 hours: Temp:  [97.5 F (36.4 C)-98.3 F (36.8 C)] 97.6 F (36.4 C) (04/04 0430) Pulse Rate:  [61-71] 66 (04/04 0430) Resp:  [18-19] 18 (04/04 0430) BP: (102-112)/(54-65) 107/60 mmHg (04/04 0430) SpO2:  [98 %-99 %] 98 % (04/04 0430)  Physical Exam:  General: alert and cooperative Lochia: appropriate Uterine Fundus: firm Incision: no significant drainage DVT Evaluation: No evidence of DVT seen on physical exam.   Recent Labs  04/21/15 0756 04/22/15 0516  HGB 12.0 10.6*  HCT 35.9* 31.8*    Assessment/Plan: Status post Cesarean section. Doing well postoperatively.  Pt unsure about BC and desires DC in am 4/5.  [place] A 04/22/2015, 2:29 PM

## 2015-04-22 NOTE — Lactation Note (Signed)
This note was copied from a baby's chart. Lactation Consultation Note  Patient Name: [name] [id number] Date: 04/22/2015 Reason for consult: Follow-up assessment;Difficult latch   Follow up with Exp BF mom of 26 hour of age. Infant with 5 BF for 10-15 minutes, 6 attempts, EBM x2 (1 & 2cc), formula via curved tip syringe x 2 ( 10 & 12 cc), 3 voids and 4 stools in last 24 hours. Infant weight 6 lb 14.1 oz with 3% weight loss at 9 hours of age.   Mom with large compressible breasts and large everted nipples. Glistening of colostrum seen on right side with hand expression. Mom reports she is pumping some. Enc her to pump every 2-3 hours for 15 minutes after BF followed by hand expression.Mom reports infant is sleepy at the breast and predominately displaying non nutritive sucks. Mom did well with stimulating the infant during and getting him latched.   Infant was awakened and placed to right breast in football hold. He was sleepy and latched briefly with no swallows heard. Infant was sleepy and did not maintain sucking pattern. Dad is syringe feeding infant after BF with Alimentum. Reviewed Supplementation amounts per LPT Infant policy and advised family to increase feeds based on age.   Plan is for mom to BF infant at first feeding cues, no longer than 3 hours between feed Supplement infant with EBM/AQlimentum per LPT Infant guidelines Pump for 15 minutes on Initiate setting followed by hand expression. Feed infant all EBM  Enc mom to call for assistance as needed.    Maternal Data Formula Feeding for Exclusion: No Has patient been taught Hand Expression?: Yes Does the patient have breastfeeding experience prior to this delivery?: Yes  Feeding Feeding Type: Breast Fed Length of feed: 5 min  LATCH Score/Interventions Latch: Repeated attempts needed to sustain latch, nipple held in mouth throughout feeding, stimulation needed to elicit sucking reflex. Intervention(s): Adjust  position;Assist with latch;Breast massage;Breast compression  Audible Swallowing: None Intervention(s): Hand expression Intervention(s): Alternate breast massage  Type of Nipple: Everted at rest and after stimulation  Comfort (Breast/Nipple): Soft / non-tender     Hold (Positioning): No assistance needed to correctly position infant at breast. Intervention(s): Breastfeeding basics reviewed;Support Pillows;Position options;Skin to skin  LATCH Score: 7  Lactation Tools Discussed/Used Pump Review: Setup, frequency, and cleaning;Milk Storage   Consult Status Consult Status: Follow-up Date: 04/23/15 Follow-up type: In-patient    Debby Freiberg Ashla Murph 04/22/2015, 12:28 PM

## 2015-04-23 LAB — GLUCOSE, CAPILLARY: GLUCOSE-CAPILLARY: 92 mg/dL (ref 65–99)

## 2015-04-23 MED ORDER — OXYCODONE-ACETAMINOPHEN 5-325 MG PO TABS: 1.0000 | ORAL_TABLET | Freq: Four times a day (QID) | ORAL | Status: DC | PRN

## 2015-04-23 MED ORDER — OXYCODONE-ACETAMINOPHEN 5-325 MG PO TABS
1.0000 | ORAL_TABLET | Freq: Four times a day (QID) | ORAL | Status: DC | PRN
  Administered 2015-04-23: 1 via ORAL
  Filled 2015-04-23: qty 1

## 2015-04-23 MED ORDER — IBUPROFEN 600 MG PO TABS: 600.0000 mg | ORAL_TABLET | Freq: Four times a day (QID) | ORAL | Status: DC

## 2015-04-23 NOTE — Discharge Summary (Signed)
OB Discharge Summary     Patient Name: [name] DOB: [birth year] MRN: [id number]  Date of admission: 04/21/2015 Delivering MD: Waymon Amato   Date of discharge: 04/23/2015  Admitting diagnosis: 14WKS, WATER BROKE Intrauterine pregnancy: [redacted]w[redacted]d     Secondary diagnosis:  Principal Problem:   Status post repeat low transverse cesarean section Active Problems:   AMA (advanced maternal age) multigravida 35+   GDM (gestational diabetes mellitus)   GERD (gastroesophageal reflux disease)   Fibroadenoma of right breast   Genital HSV   Previous cesarean section   Pyelectasis of fetus on prenatal ultrasound--left, noted at    Full-term premature rupture of membranes  Additional problems: none     Discharge diagnosis: Term Pregnancy Delivered, GDM A2 and Anemia                                                                                                Post partum procedures:none  Augmentation: none  Complications: None  Hospital course:  Onset of Labor With Unplanned C/S  35 y.o. yo G3P2011 at [redacted]w[redacted]d was admitted in Latent Labor on 04/21/2015. Patient had a labor course significant for PROM. Membrane Rupture Time/Date: 5:43 AM ,04/21/2015   The patient went for cesarean section due to Prior Uterine Surgery, and delivered a Viable infant,04/21/2015  Details of operation can be found in separate operative note. Patient had an uncomplicated postpartum course.  She is ambulating,tolerating a regular diet, passing flatus, and urinating well.  Patient is discharged home in stable condition 04/23/2015.  Physical exam  Filed Vitals:   04/22/15 0036 04/22/15 0430 04/22/15 1700 04/23/15 0623  BP: 112/65 107/60 115/70 119/58  Pulse: 61 66 74 90  Temp: 98.1 F (36.7 C) 97.6 F (36.4 C) 97.6 F (36.4 C) 98.2 F (36.8 C)  TempSrc: Oral Oral Oral Oral  Resp: 18 18 18 20   Height:      Weight:      SpO2: 99% 98%     General: alert and cooperative Lochia: appropriate Uterine Fundus:  firm Incision: Healing well with no significant drainage DVT Evaluation: No evidence of DVT seen on physical exam. Labs: Lab Results  Component Value Date   WBC 9.6 04/22/2015   HGB 10.6* 04/22/2015   HCT 31.8* 04/22/2015   MCV 82.8 04/22/2015   PLT 219 04/22/2015   CMP Latest Ref Rng 10/08/2008  Glucose 70 - 99 mg/dL 107(H)  BUN 6 - 23 mg/dL 3(L)  Creatinine 0.4 - 1.2 mg/dL 0.57  Sodium 135 - 145 mEq/L 136  Potassium 3.5 - 5.1 mEq/L 3.8  Chloride 96 - 112 mEq/L 110  CO2 19 - 32 mEq/L 21  Calcium 8.4 - 10.5 mg/dL 9.7  Total Protein 6.0 - 8.3 g/dL 7.1  Total Bilirubin 0.3 - 1.2 mg/dL 0.4  Alkaline Phos 39 - 117 U/L 149(H)  AST 0 - 37 U/L 23  ALT 0 - 35 U/L 22    Discharge instruction: per After Visit Summary and "Baby and Me Booklet".  After visit meds:    Medication List    TAKE these medications  FERRALET 90 90-1 MG Tabs  Take 1 tablet by mouth daily.     ibuprofen 600 MG tablet  Commonly known as:  ADVIL,MOTRIN  Take 1 tablet (600 mg total) by mouth every 6 (six) hours.     omeprazole 20 MG capsule  Commonly known as:  PRILOSEC  Take 20 mg by mouth daily as needed (for heartburn).     oxyCODONE-acetaminophen 5-325 MG tablet  Commonly known as:  PERCOCET/ROXICET  Take 1-2 tablets by mouth every 6 (six) hours as needed for moderate pain or severe pain.     prenatal multivitamin Tabs tablet  Take 1 tablet by mouth daily at 12 noon.        Diet: routine diet  Activity: Advance as tolerated. Pelvic rest for 6 weeks.   Outpatient follow up:6 weeks Follow up Appt:No future appointments. Follow up Visit:No Follow-up on file.  Postpartum contraception: Condoms  Newborn Data: Live born female  Birth Weight: 7 lb 1.1 oz (3205 g) APGAR: 8, 9  Baby Feeding: Breast Disposition:home with mother   04/23/2015 Lavetta Nielsen, CNM

## 2015-04-23 NOTE — Discharge Instructions (Signed)
Postpartum Care After Cesarean Delivery After you deliver your newborn (postpartum period), the usual stay in the hospital is 24-72 hours. If there were problems with your labor or delivery, or if you have other medical problems, you might be in the hospital longer.  While you are in the hospital, you will receive help and instructions on how to care for yourself and your newborn during the postpartum period.  While you are in the hospital:  It is normal for you to have pain or discomfort from the incision in your abdomen. Be sure to tell your nurses when you are having pain, where the pain is located, and what makes the pain worse.  If you are breastfeeding, you may feel uncomfortable contractions of your uterus for a couple of weeks. This is normal. The contractions help your uterus get back to normal size.  It is normal to have some bleeding after delivery.  For the first 1-3 days after delivery, the flow is red and the amount may be similar to a period.  It is common for the flow to start and stop.  In the first few days, you may pass some small clots. Let your nurses know if you begin to pass large clots or your flow increases.  Do not  flush blood clots down the toilet before having the nurse look at them.  During the next 3-10 days after delivery, your flow should become more watery and pink or brown-tinged in color.  Ten to fourteen days after delivery, your flow should be a small amount of yellowish-white discharge.  The amount of your flow will decrease over the first few weeks after delivery. Your flow may stop in 6-8 weeks. Most women have had their flow stop by 12 weeks after delivery.  You should change your sanitary pads frequently.  Wash your hands thoroughly with soap and water for at least 20 seconds after changing pads, using the toilet, or before holding or feeding your newborn.  Your intravenous (IV) tubing will be removed when you are drinking enough fluids.  The  urine drainage tube (urinary catheter) that was inserted before delivery may be removed within 6-8 hours after delivery or when feeling returns to your legs. You should feel like you need to empty your bladder within the first 6-8 hours after the catheter has been removed.  In case you become weak, lightheaded, or faint, call your nurse before you get out of bed for the first time and before you take a shower for the first time.  Within the first few days after delivery, your breasts may begin to feel tender and full. This is called engorgement. Breast tenderness usually goes away within 48-72 hours after engorgement occurs. You may also notice milk leaking from your breasts. If you are not breastfeeding, do not stimulate your breasts. Breast stimulation can make your breasts produce more milk.  Spending as much time as possible with your newborn is very important. During this time, you and your newborn can feel close and get to know each other. Having your newborn stay in your room (rooming in) will help to strengthen the bond with your newborn. It will give you time to get to know your newborn and become comfortable caring for your newborn.  Your hormones change after delivery. Sometimes the hormone changes can temporarily cause you to feel sad or tearful. These feelings should not last more than a few days. If these feelings last longer than that, you should talk to your  caregiver. °· If desired, talk to your caregiver about methods of family planning or contraception. °· Talk to your caregiver about immunizations. Your caregiver may want you to have the following immunizations before leaving the hospital: °· Tetanus, diphtheria, and pertussis (Tdap) or tetanus and diphtheria (Td) immunization. It is very important that you and your family (including grandparents) or others caring for your newborn are up-to-date with the Tdap or Td immunizations. The Tdap or Td immunization can help protect your newborn  from getting ill. °· Rubella immunization. °· Varicella (chickenpox) immunization. °· Influenza immunization. You should receive this annual immunization if you did not receive the immunization during your pregnancy. °  °This information is not intended to replace advice given to you by your health care provider. Make sure you discuss any questions you have with your health care provider. °  °Document Released: 09/29/2011 Document Reviewed: 09/29/2011 °Elsevier Interactive Patient Education ©2016 [employer] °Breastfeeding °Deciding to breastfeed is one of the best choices you can make for you and your baby. A change in hormones during pregnancy causes your breast tissue to grow and increases the number and size of your milk ducts. These hormones also allow proteins, sugars, and fats from your blood supply to make breast milk in your milk-producing glands. Hormones prevent breast milk from being released before your baby is born as well as prompt milk flow after birth. Once breastfeeding has begun, thoughts of your baby, as well as his or her sucking or crying, can stimulate the release of milk from your milk-producing glands.  °BENEFITS OF BREASTFEEDING °For Your Baby °· Your first milk (colostrum) helps your baby's digestive system function better. °· There are antibodies in your milk that help your baby fight off infections. °· Your baby has a lower incidence of asthma, allergies, and sudden infant death syndrome. °· The nutrients in breast milk are better for your baby than infant formulas and are designed uniquely for your baby's needs. °· Breast milk improves your baby's brain development. °· Your baby is less likely to develop other conditions, such as childhood obesity, asthma, or type 2 diabetes mellitus. °For You °· Breastfeeding helps to create a very special bond between you and your baby. °· Breastfeeding is convenient. Breast milk is always available at the correct temperature and costs  nothing. °· Breastfeeding helps to burn calories and helps you lose the weight gained during pregnancy. °· Breastfeeding makes your uterus contract to its prepregnancy size faster and slows bleeding (lochia) after you give birth.   °· Breastfeeding helps to lower your risk of developing type 2 diabetes mellitus, osteoporosis, and breast or ovarian cancer later in life. °SIGNS THAT YOUR BABY IS HUNGRY °Early Signs of Hunger °· Increased alertness or activity. °· Stretching. °· Movement of the head from side to side. °· Movement of the head and opening of the mouth when the corner of the mouth or cheek is stroked (rooting). °· Increased sucking sounds, smacking lips, cooing, sighing, or squeaking. °· Hand-to-mouth movements. °· Increased sucking of fingers or hands. °Late Signs of Hunger °· Fussing. °· Intermittent crying. °Extreme Signs of Hunger °Signs of extreme hunger will require calming and consoling before your baby will be able to breastfeed successfully. Do not wait for the following signs of extreme hunger to occur before you initiate breastfeeding: °· Restlessness. °· A loud, strong cry. °· Screaming. °BREASTFEEDING BASICS °Breastfeeding Initiation °· Find a comfortable place to sit or lie down, with your neck and back well supported. °· Place   a pillow or rolled up blanket under your baby to bring him or her to the level of your breast (if you are seated). Nursing pillows are specially designed to help support your arms and your baby while you breastfeed.  Make sure that your baby's abdomen is facing your abdomen.  Gently massage your breast. With your fingertips, massage from your chest wall toward your nipple in a circular motion. This encourages milk flow. You may need to continue this action during the feeding if your milk flows slowly.  Support your breast with 4 fingers underneath and your thumb above your nipple. Make sure your fingers are well away from your nipple and your baby's  mouth.  Stroke your baby's lips gently with your finger or nipple.  When your baby's mouth is open wide enough, quickly bring your baby to your breast, placing your entire nipple and as much of the colored area around your nipple (areola) as possible into your baby's mouth.  More areola should be visible above your baby's upper lip than below the lower lip.  Your baby's tongue should be between his or her lower gum and your breast.  Ensure that your baby's mouth is correctly positioned around your nipple (latched). Your baby's lips should create a seal on your breast and be turned out (everted).  It is common for your baby to suck about 2-3 minutes in order to start the flow of breast milk. Latching Teaching your baby how to latch on to your breast properly is very important. An improper latch can cause nipple pain and decreased milk supply for you and poor weight gain in your baby. Also, if your baby is not latched onto your nipple properly, he or she may swallow some air during feeding. This can make your baby fussy. Burping your baby when you switch breasts during the feeding can help to get rid of the air. However, teaching your baby to latch on properly is still the best way to prevent fussiness from swallowing air while breastfeeding. Signs that your baby has successfully latched on to your nipple:  Silent tugging or silent sucking, without causing you pain.  Swallowing heard between every 3-4 sucks.  Muscle movement above and in front of his or her ears while sucking. Signs that your baby has not successfully latched on to nipple:  Sucking sounds or smacking sounds from your baby while breastfeeding.  Nipple pain. If you think your baby has not latched on correctly, slip your finger into the corner of your baby's mouth to break the suction and place it between your baby's gums. Attempt breastfeeding initiation again. Signs of Successful Breastfeeding Signs from your baby:  A  gradual decrease in the number of sucks or complete cessation of sucking.  Falling asleep.  Relaxation of his or her body.  Retention of a small amount of milk in his or her mouth.  Letting go of your breast by himself or herself. Signs from you:  Breasts that have increased in firmness, weight, and size 1-3 hours after feeding.  Breasts that are softer immediately after breastfeeding.  Increased milk volume, as well as a change in milk consistency and color by the fifth day of breastfeeding.  Nipples that are not sore, cracked, or bleeding. Signs That Your [employer] is Getting Enough Milk  Wetting at least 3 diapers in a 24-hour period. The urine should be clear and pale yellow by age 60 days.  At least 3 stools in a 24-hour period by age  5 days. The stool should be soft and yellow. °· At least 3 stools in a 24-hour period by age 7 days. The stool should be seedy and yellow. °· No loss of weight greater than 10% of birth weight during the first 3 days of age. °· Average weight gain of 4-7 ounces (113-198 g) per week after age 4 days. °· Consistent daily weight gain by age 5 days, without weight loss after the age of 2 weeks. °After a feeding, your baby may spit up a small amount. This is common. °BREASTFEEDING FREQUENCY AND DURATION °Frequent feeding will help you make more milk and can prevent sore nipples and breast engorgement. Breastfeed when you feel the need to reduce the fullness of your breasts or when your baby shows signs of hunger. This is called "breastfeeding on demand." Avoid introducing a pacifier to your baby while you are working to establish breastfeeding (the first 4-6 weeks after your baby is born). After this time you may choose to use a pacifier. Research has shown that pacifier use during the first year of a baby's life decreases the risk of sudden infant death syndrome (SIDS). °Allow your baby to feed on each breast as long as he or she wants. Breastfeed until your baby is  finished feeding. When your baby unlatches or falls asleep while feeding from the first breast, offer the second breast. Because newborns are often sleepy in the first few weeks of life, you may need to awaken your baby to get him or her to feed. °Breastfeeding times will vary from baby to baby. However, the following rules can serve as a guide to help you ensure that your baby is properly fed: °· Newborns (babies 4 weeks of age or younger) may breastfeed every 1-3 hours. °· Newborns should not go longer than 3 hours during the day or 5 hours during the night without breastfeeding. °· You should breastfeed your baby a minimum of 8 times in a 24-hour period until you begin to introduce solid foods to your baby at around 6 months of age. °BREAST MILK PUMPING °Pumping and storing breast milk allows you to ensure that your baby is exclusively fed your breast milk, even at times when you are unable to breastfeed. This is especially important if you are going back to work while you are still breastfeeding or when you are not able to be present during feedings. Your lactation consultant can give you guidelines on how long it is safe to store breast milk. °A breast pump is a machine that allows you to pump milk from your breast into a sterile bottle. The pumped breast milk can then be stored in a refrigerator or freezer. Some breast pumps are operated by hand, while others use electricity. Ask your lactation consultant which type will work best for you. Breast pumps can be purchased, but some hospitals and breastfeeding support groups lease breast pumps on a monthly basis. A lactation consultant can teach you how to hand express breast milk, if you prefer not to use a pump. °CARING FOR YOUR BREASTS WHILE YOU BREASTFEED °Nipples can become dry, cracked, and sore while breastfeeding. The following recommendations can help keep your breasts moisturized and healthy: °· Avoid using soap on your nipples. °· Wear a supportive bra.  Although not required, special nursing bras and tank tops are designed to allow access to your breasts for breastfeeding without taking off your entire bra or top. Avoid wearing underwire-style bras or extremely tight bras. °· Air dry   your nipples for 3-62mnutes after each feeding.  Use only cotton bra pads to absorb leaked breast milk. Leaking of breast milk between feedings is normal.  Use lanolin on your nipples after breastfeeding. Lanolin helps to maintain your skin's normal moisture barrier. If you use pure lanolin, you do not need to wash it off before feeding your baby again. Pure lanolin is not toxic to your baby. You may also hand express a few drops of breast milk and gently massage that milk into your nipples and allow the milk to air dry. In the first few weeks after giving birth, some women experience extremely full breasts (engorgement). Engorgement can make your breasts feel heavy, warm, and tender to the touch. Engorgement peaks within 3-5 days after you give birth. The following recommendations can help ease engorgement:  Completely empty your breasts while breastfeeding or pumping. You may want to start by applying warm, moist heat (in the shower or with warm water-soaked hand towels) just before feeding or pumping. This increases circulation and helps the milk flow. If your baby does not completely empty your breasts while breastfeeding, pump any extra milk after he or she is finished.  Wear a snug bra (nursing or regular) or tank top for 1-2 days to signal your body to slightly decrease milk production.  Apply ice packs to your breasts, unless this is too uncomfortable for you.  Make sure that your baby is latched on and positioned properly while breastfeeding. If engorgement persists after 48 hours of following these recommendations, contact your health care provider or a lScience writer OVERALL HEALTH CARE RECOMMENDATIONS WHILE BREASTFEEDING  Eat healthy foods.  Alternate between meals and snacks, eating 3 of each per day. Because what you eat affects your breast milk, some of the foods may make your baby more irritable than usual. Avoid eating these foods if you are sure that they are negatively affecting your baby.  Drink milk, fruit juice, and water to satisfy your thirst (about 10 glasses a day).  Rest often, relax, and continue to take your prenatal vitamins to prevent fatigue, stress, and anemia.  Continue breast self-awareness checks.  Avoid chewing and smoking tobacco. Chemicals from cigarettes that pass into breast milk and exposure to secondhand smoke may harm your baby.  Avoid alcohol and drug use, including marijuana. Some medicines that may be harmful to your baby can pass through breast milk. It is important to ask your health care provider before taking any medicine, including all over-the-counter and prescription medicine as well as vitamin and herbal supplements. It is possible to become pregnant while breastfeeding. If birth control is desired, ask your health care provider about options that will be safe for your baby. SEEK MEDICAL CARE IF:  You feel like you want to stop breastfeeding or have become frustrated with breastfeeding.  You have painful breasts or nipples.  Your nipples are cracked or bleeding.  Your breasts are red, tender, or warm.  You have a swollen area on either breast.  You have a fever or chills.  You have nausea or vomiting.  You have drainage other than breast milk from your nipples.  Your breasts do not become full before feedings by the fifth day after you give birth.  You feel sad and depressed.  Your baby is too sleepy to eat well.  Your baby is having trouble sleeping.   Your baby is wetting less than 3 diapers in a 24-hour period.  Your baby has less than 3 stools in  a 24-hour period.  Your baby's skin or the white part of his or her eyes becomes yellow.   Your baby is not gaining  weight by 30 days of age. SEEK IMMEDIATE MEDICAL CARE IF:  Your baby is overly tired (lethargic) and does not want to wake up and feed.  Your baby develops an unexplained fever.   This information is not intended to replace advice given to you by your health care provider. Make sure you discuss any questions you have with your health care provider.   Document Released: 01/04/2005 Document Revised: 09/25/2014 Document Reviewed: 06/28/2012 Elsevier Interactive Patient Education 2016 [employer]. Iron-Rich Diet Iron is a mineral that helps your body to produce hemoglobin. Hemoglobin is a protein in your red blood cells that carries oxygen to your body's tissues. Eating too little iron may cause you to feel weak and tired, and it can increase your risk for infection. Eating enough iron is necessary for your body's metabolism, muscle function, and nervous system. Iron is naturally found in many foods. It can also be added to foods or fortified in foods. There are two types of dietary iron:  Heme iron. Heme iron is absorbed by the body more easily than nonheme iron. Heme iron is found in meat, poultry, and fish.  Nonheme iron. Nonheme iron is found in dietary supplements, iron-fortified grains, beans, and vegetables. You may need to follow an iron-rich diet if:  You have been diagnosed with iron deficiency or iron-deficiency anemia.  You have a condition that prevents you from absorbing dietary iron, such as:  Infection in your intestines.  Celiac disease. This involves long-lasting (chronic) inflammation of your intestines.  You do not eat enough iron.  You eat a diet that is high in foods that impair iron absorption.  You have lost a lot of blood.  You have heavy bleeding during your menstrual cycle.  You are pregnant. WHAT IS MY PLAN? Your health care provider may help you to determine how much iron you need per day based on your condition. Generally, when a person consumes  sufficient amounts of iron in the diet, the following iron needs are met:  Men.  5-24 years old: 11 mg per day.  73-25 years old: 8 mg per day.  Women.   24-44 years old: 15 mg per day.  33-82 years old: 18 mg per day.  Over 69 years old: 8 mg per day.  Pregnant women: 27 mg per day.  Breastfeeding women: 9 mg per day. WHAT DO I NEED TO KNOW ABOUT AN IRON-RICH DIET?  Eat fresh fruits and vegetables that are high in vitamin C along with foods that are high in iron. This will help increase the amount of iron that your body absorbs from food, especially with foods containing nonheme iron. Foods that are high in vitamin C include oranges, peppers, tomatoes, and mango.  Take iron supplements only as directed by your health care provider. Overdose of iron can be life-threatening. If you were prescribed iron supplements, take them with orange juice or a vitamin C supplement.  Cook foods in pots and pans that are made from iron.   Eat nonheme iron-containing foods alongside foods that are high in heme iron. This helps to improve your iron absorption.   Certain foods and drinks contain compounds that impair iron absorption. Avoid eating these foods in the same meal as iron-rich foods or with iron supplements. These include:  Coffee, black tea, and red wine.  Milk, dairy  products, and foods that are high in calcium.  Beans, soybeans, and peas.  Whole grains.  When eating foods that contain both nonheme iron and compounds that impair iron absorption, follow these tips to absorb iron better.   Soak beans overnight before cooking.  Soak whole grains overnight and drain them before using.  Ferment flours before baking, such as using yeast in bread dough. WHAT FOODS CAN I EAT? Grains Iron-fortified breakfast cereal. Iron-fortified whole-wheat bread. Enriched rice. Sprouted grains. Vegetables Spinach. Potatoes with skin. Green peas. Broccoli. Red and green bell peppers.  Fermented vegetables. Fruits Prunes. Raisins. Oranges. Strawberries. Mango. Grapefruit. Meats and Other Protein Sources Beef liver. Oysters. Beef. Shrimp. [place]. Chicken. [place]. Sardines. Chickpeas. Nuts. Tofu. Beverages Tomato juice. Fresh orange juice. Prune juice. Hibiscus tea. Fortified instant breakfast shakes. Condiments Tahini. Fermented soy sauce. Sweets and Desserts Black-strap molasses.  Other Wheat germ. The items listed above may not be a complete list of recommended foods or beverages. Contact your dietitian for more options. WHAT FOODS ARE NOT RECOMMENDED? Grains Whole grains. Bran cereal. Bran flour. Oats. Vegetables Artichokes. Brussels sprouts. Kale. Fruits Blueberries. Raspberries. Strawberries. Figs. Meats and Other Protein Sources Soybeans. Products made from soy protein. Dairy Milk. Cream. Cheese. Yogurt. Cottage cheese. Beverages Coffee. Black tea. Red wine. Sweets and Desserts Cocoa. Chocolate. Ice cream. Other Basil. Oregano. Parsley. The items listed above may not be a complete list of foods and beverages to avoid. Contact your dietitian for more information.   This information is not intended to replace advice given to you by your health care provider. Make sure you discuss any questions you have with your health care provider.   Document Released: 08/18/2004 Document Revised: 01/25/2014 Document Reviewed: 08/01/2013 Elsevier Interactive Patient Education 2016 [employer]. Postpartum Depression and Baby Blues The postpartum period begins right after the birth of a baby. During this time, there is often a great amount of joy and excitement. It is also a time of many changes in the life of the parents. Regardless of how many times a mother gives birth, each child brings new challenges and dynamics to the family. It is not unusual to have feelings of excitement along with confusing shifts in moods, emotions, and thoughts. All mothers are  at risk of developing postpartum depression or the "baby blues." These mood changes can occur right after giving birth, or they may occur many months after giving birth. The baby blues or postpartum depression can be mild or severe. Additionally, postpartum depression can go away rather quickly, or it can be a long-term condition.  CAUSES Raised hormone levels and the rapid drop in those levels are thought to be a main cause of postpartum depression and the baby blues. A number of hormones change during and after pregnancy. Estrogen and progesterone usually decrease right after the delivery of your baby. The levels of thyroid hormone and various cortisol steroids also rapidly drop. Other factors that play a role in these mood changes include major life events and genetics.  RISK FACTORS If you have any of the following risks for the baby blues or postpartum depression, know what symptoms to watch out for during the postpartum period. Risk factors that may increase the likelihood of getting the baby blues or postpartum depression include:  Having a personal or family history of depression.   Having depression while being pregnant.   Having premenstrual mood issues or mood issues related to oral contraceptives.  Having a lot of life stress.   Having marital conflict.  Lacking a social support network.   Having a baby with special needs.   Having health problems, such as diabetes.  SIGNS AND SYMPTOMS Symptoms of baby blues include:  Brief changes in mood, such as going from extreme happiness to sadness.  Decreased concentration.   Difficulty sleeping.   Crying spells, tearfulness.   Irritability.   Anxiety.  Symptoms of postpartum depression typically begin within the first month after giving birth. These symptoms include:  Difficulty sleeping or excessive sleepiness.   Marked weight loss.   Agitation.   Feelings of worthlessness.   Lack of interest in  activity or food.  Postpartum psychosis is a very serious condition and can be dangerous. Fortunately, it is rare. Displaying any of the following symptoms is cause for immediate medical attention. Symptoms of postpartum psychosis include:   Hallucinations and delusions.   Bizarre or disorganized behavior.   Confusion or disorientation.  DIAGNOSIS  A diagnosis is made by an evaluation of your symptoms. There are no medical or lab tests that lead to a diagnosis, but there are various questionnaires that a health care provider may use to identify those with the baby blues, postpartum depression, or psychosis. Often, a screening tool called the [place] Postnatal Depression Scale is used to diagnose depression in the postpartum period.  TREATMENT The baby blues usually goes away on its own in 1-2 weeks. Social support is often all that is needed. You will be encouraged to get adequate sleep and rest. Occasionally, you may be given medicines to help you sleep.  Postpartum depression requires treatment because it can last several months or longer if it is not treated. Treatment may include individual or group therapy, medicine, or both to address any social, physiological, and psychological factors that may play a role in the depression. Regular exercise, a healthy diet, rest, and social support may also be strongly recommended.  Postpartum psychosis is more serious and needs treatment right away. Hospitalization is often needed. HOME CARE INSTRUCTIONS  Get as much rest as you can. Nap when the baby sleeps.   Exercise regularly. Some women find yoga and walking to be beneficial.   Eat a balanced and nourishing diet.   Do little things that you enjoy. Have a cup of tea, take a bubble bath, read your favorite magazine, or listen to your favorite music.  Avoid alcohol.   Ask for help with household chores, cooking, grocery shopping, or running errands as needed. Do not try to do  everything.   Talk to people close to you about how you are feeling. Get support from your partner, family members, friends, or other new moms.  Try to stay positive in how you think. Think about the things you are grateful for.   Do not spend a lot of time alone.   Only take over-the-counter or prescription medicine as directed by your health care provider.  Keep all your postpartum appointments.   Let your health care provider know if you have any concerns.  SEEK MEDICAL CARE IF: You are having a reaction to or problems with your medicine. SEEK IMMEDIATE MEDICAL CARE IF:  You have suicidal feelings.   You think you may harm the baby or someone else. MAKE SURE YOU:  Understand these instructions.  Will watch your condition.  Will get help right away if you are not doing well or get worse.   This information is not intended to replace advice given to you by your health care provider. Make sure you  discuss any questions you have with your health care provider.   Document Released: 10/09/2003 Document Revised: 01/09/2013 Document Reviewed: 10/16/2012 Elsevier Interactive Patient Education [employer].

## 2015-04-23 NOTE — Lactation Note (Signed)
This note was copied from a baby's chart. Lactation Consultation Note; Mom called out for assist with latch. States she is having trouble getting his mouth to open wide and he is just on the nipple and it is hurting. Encouraged to wait for wide open mouth. After a couple of attempts baby latched well and nursed for 15 min. Mom easily able to hand express whitish milk and reports breasts are feeling fuller this morning. Going to pump after he nurses. Has been supplementing with formula but now that she is making more milk can supplement with EBM. Dad has been syringe feeding. Has Medela pump at home. No questions at present. To call for assist prn  Patient Name: Boy Daijanae Angelopoulos [id number] Date: 04/23/2015 Reason for consult: Follow-up assessment   Maternal Data    Feeding Feeding Type: Breast Fed Length of feed: 15 min  LATCH Score/Interventions Latch: Grasps breast easily, tongue down, lips flanged, rhythmical sucking.  Audible Swallowing: A few with stimulation  Type of Nipple: Everted at rest and after stimulation  Comfort (Breast/Nipple): Soft / non-tender     Hold (Positioning): Assistance needed to correctly position infant at breast and maintain latch. Intervention(s): Breastfeeding basics reviewed;Position options  LATCH Score: 8  Lactation Tools Discussed/Used WIC Program: No   Consult Status Consult Status: Follow-up Date: 04/24/15 Follow-up type: In-patient    Truddie Crumble 04/23/2015, 10:04 AM

## 2015-05-05 ENCOUNTER — Encounter (HOSPITAL_COMMUNITY): Admission: RE | Payer: Self-pay | Source: Ambulatory Visit

## 2015-05-05 ENCOUNTER — Inpatient Hospital Stay (HOSPITAL_COMMUNITY)
Admission: RE | Admit: 2015-05-05 | Payer: BC Managed Care – PPO | Source: Ambulatory Visit | Admitting: Obstetrics and Gynecology

## 2015-05-05 SURGERY — Surgical Case
Anesthesia: Regional

## 2016-02-08 DIAGNOSIS — J0100 Acute maxillary sinusitis, unspecified: Secondary | ICD-10-CM | POA: Diagnosis not present

## 2016-06-08 DIAGNOSIS — L218 Other seborrheic dermatitis: Secondary | ICD-10-CM | POA: Diagnosis not present

## 2016-11-17 DIAGNOSIS — Z01419 Encounter for gynecological examination (general) (routine) without abnormal findings: Secondary | ICD-10-CM | POA: Diagnosis not present

## 2016-11-17 DIAGNOSIS — Z124 Encounter for screening for malignant neoplasm of cervix: Secondary | ICD-10-CM | POA: Diagnosis not present

## 2016-12-22 ENCOUNTER — Other Ambulatory Visit: Payer: Self-pay | Admitting: Internal Medicine

## 2016-12-22 DIAGNOSIS — Z1321 Encounter for screening for nutritional disorder: Secondary | ICD-10-CM

## 2016-12-22 DIAGNOSIS — Z13220 Encounter for screening for lipoid disorders: Secondary | ICD-10-CM

## 2016-12-22 DIAGNOSIS — R7302 Impaired glucose tolerance (oral): Secondary | ICD-10-CM

## 2016-12-22 DIAGNOSIS — Z1329 Encounter for screening for other suspected endocrine disorder: Secondary | ICD-10-CM

## 2016-12-22 DIAGNOSIS — Z0000 Encounter for general adult medical examination without abnormal findings: Secondary | ICD-10-CM

## 2016-12-24 ENCOUNTER — Other Ambulatory Visit: Payer: BC Managed Care – PPO | Admitting: Internal Medicine

## 2016-12-28 ENCOUNTER — Encounter: Payer: BC Managed Care – PPO | Admitting: Internal Medicine

## 2016-12-30 ENCOUNTER — Ambulatory Visit (INDEPENDENT_AMBULATORY_CARE_PROVIDER_SITE_OTHER): Payer: BC Managed Care – PPO | Admitting: Internal Medicine

## 2016-12-30 ENCOUNTER — Encounter: Payer: Self-pay | Admitting: Internal Medicine

## 2016-12-30 VITALS — BP 112/70 | HR 73 | Temp 98.4°F | Wt 245.0 lb

## 2016-12-30 DIAGNOSIS — H1031 Unspecified acute conjunctivitis, right eye: Secondary | ICD-10-CM | POA: Diagnosis not present

## 2016-12-30 MED ORDER — OFLOXACIN 0.3 % OP SOLN: OPHTHALMIC | 1 refills | Status: DC

## 2016-12-30 NOTE — Progress Notes (Signed)
   Subjective:    Patient ID: [name], female    DOB: [birth year], 36 y.o.   MRN: [id number]  HPI 35 year old Black Female not seen here since February 2016.  She had a complicated pregnancy and had a C-section in April 2017.  She had gestational diabetes.  GYN care done through [facility].  Complaining of headache and awakened with some right eye redness and drainage.  Son has had what sounds like pinkeye.     Review of Systems see above     Objective:   Physical Exam Conjunctiva right eye is erythematous.  No frank drainage noted today.  Extraocular movements A.  Headache has improved through the day.       Assessment & Plan:  Right conjunctivitis  Plan: Ocuflox ophthalmic drops 2 drops in each eye 4 times daily for 5 days.

## 2017-01-03 ENCOUNTER — Ambulatory Visit: Payer: Self-pay | Admitting: Internal Medicine

## 2017-01-03 ENCOUNTER — Ambulatory Visit (INDEPENDENT_AMBULATORY_CARE_PROVIDER_SITE_OTHER): Payer: BC Managed Care – PPO | Admitting: Internal Medicine

## 2017-01-03 VITALS — BP 120/70 | HR 79 | Temp 98.5°F | Wt 247.0 lb

## 2017-01-03 DIAGNOSIS — H6691 Otitis media, unspecified, right ear: Secondary | ICD-10-CM | POA: Diagnosis not present

## 2017-01-03 DIAGNOSIS — H1032 Unspecified acute conjunctivitis, left eye: Secondary | ICD-10-CM

## 2017-01-03 MED ORDER — AZITHROMYCIN 250 MG PO TABS: ORAL_TABLET | ORAL | 0 refills | Status: DC

## 2017-01-03 NOTE — Patient Instructions (Signed)
Ocuflox ophthalmic drops 2 drops in each eye 4 times a day for 5 days

## 2017-01-03 NOTE — Progress Notes (Signed)
   Subjective:    Patient ID: [name], female    DOB: 1980-08-18, 36 y.o.   MRN: [id number]  HPI She returns today saying she is no better with regard to conjunctivitis left eye.  Her lid is more swollen and conjunctiva is more erythematous.  She has been using Ocuflox ophthalmic drops in both eyes since last visit.  Husband now has similar illness.  It initially started with her young son she is also complaining of right ear pain.  No fever or chills.  Slight sore throat.  Says there is a bump under her chin which I think is a submandibular lymph node.    Review of Systems     Objective:   Physical Exam Small submandibular lymph node under her chin.  Right TM is dull with absent light reflex.  Left TM  is pink with splayed light reflex neck is supple.  Conjunctiva left eye very red particularly lower lid and swollen upper lid.         Assessment & Plan:  Persistent conjunctivitis left eye  Right otitis media  Submandibular lymph node  Plan: Zithromax Z-Pak take 2 tabs day 1 followed by 1 tab days 2 through 5 and continue Ocuflox ophthalmic solution.  Her husband is going to the eye doctor today and have asked her to see if she can also be seen there she is very worried that she is not getting better but this could take several days.

## 2017-01-03 NOTE — Patient Instructions (Signed)
Asked patient to see if physician can take a look at her today.  Add Zithromax Z-Pak Ocuflox ophthalmic drops

## 2017-01-07 DIAGNOSIS — B300 Keratoconjunctivitis due to adenovirus: Secondary | ICD-10-CM | POA: Diagnosis not present

## 2017-02-18 DIAGNOSIS — Q140 Congenital malformation of vitreous humor: Secondary | ICD-10-CM | POA: Diagnosis not present

## 2017-08-11 DIAGNOSIS — Z3040 Encounter for surveillance of contraceptives, unspecified: Secondary | ICD-10-CM | POA: Diagnosis not present

## 2017-09-26 ENCOUNTER — Other Ambulatory Visit: Payer: Self-pay | Admitting: Internal Medicine

## 2017-09-26 DIAGNOSIS — Z13220 Encounter for screening for lipoid disorders: Secondary | ICD-10-CM

## 2017-09-26 DIAGNOSIS — Z0000 Encounter for general adult medical examination without abnormal findings: Secondary | ICD-10-CM

## 2017-09-26 DIAGNOSIS — Z1329 Encounter for screening for other suspected endocrine disorder: Secondary | ICD-10-CM

## 2017-09-26 DIAGNOSIS — Z1321 Encounter for screening for nutritional disorder: Secondary | ICD-10-CM

## 2017-09-27 ENCOUNTER — Other Ambulatory Visit: Payer: BC Managed Care – PPO | Admitting: Internal Medicine

## 2017-09-27 DIAGNOSIS — Z1321 Encounter for screening for nutritional disorder: Secondary | ICD-10-CM

## 2017-09-27 DIAGNOSIS — R7302 Impaired glucose tolerance (oral): Secondary | ICD-10-CM

## 2017-09-27 DIAGNOSIS — Z1329 Encounter for screening for other suspected endocrine disorder: Secondary | ICD-10-CM

## 2017-09-27 DIAGNOSIS — Z13220 Encounter for screening for lipoid disorders: Secondary | ICD-10-CM

## 2017-09-27 DIAGNOSIS — Z0000 Encounter for general adult medical examination without abnormal findings: Secondary | ICD-10-CM

## 2017-09-28 LAB — LIPID PANEL
Cholesterol: 149 mg/dL (ref <200)
HDL: 44 mg/dL — ABNORMAL LOW (ref >50)
LDL Cholesterol (Calc): 88 mg/dL (calc)
NON-HDL CHOLESTEROL (CALC): 105 mg/dL (ref <130)
Total CHOL/HDL Ratio: 3.4 (calc) (ref <5.0)
Triglycerides: 81 mg/dL (ref <150)

## 2017-09-28 LAB — HEMOGLOBIN A1C
EAG (MMOL/L): 7 (calc)
HEMOGLOBIN A1C: 6 %{Hb} — AB (ref <5.7)
MEAN PLASMA GLUCOSE: 126 (calc)

## 2017-09-28 LAB — CBC WITH DIFFERENTIAL/PLATELET
BASOS PCT: 0.6 %
Basophils Absolute: 42 cells/uL (ref 0–200)
EOS ABS: 112 {cells}/uL (ref 15–500)
Eosinophils Relative: 1.6 %
HCT: 35.2 % (ref 35.0–45.0)
Hemoglobin: 11.6 g/dL — ABNORMAL LOW (ref 11.7–15.5)
LYMPHS ABS: 1911 {cells}/uL (ref 850–3900)
MCH: 27.7 pg (ref 27.0–33.0)
MCHC: 33 g/dL (ref 32.0–36.0)
MCV: 84 fL (ref 80.0–100.0)
MPV: 11.1 fL (ref 7.5–12.5)
Monocytes Relative: 6.7 %
NEUTROS PCT: 63.8 %
Neutro Abs: 4466 cells/uL (ref 1500–7800)
PLATELETS: 367 10*3/uL (ref 140–400)
RBC: 4.19 10*6/uL (ref 3.80–5.10)
RDW: 13.1 % (ref 11.0–15.0)
TOTAL LYMPHOCYTE: 27.3 %
WBC: 7 10*3/uL (ref 3.8–10.8)
WBCMIX: 469 {cells}/uL (ref 200–950)

## 2017-09-28 LAB — VITAMIN D 25 HYDROXY (VIT D DEFICIENCY, FRACTURES): Vit D, 25-Hydroxy: 18 ng/mL — ABNORMAL LOW (ref 30–100)

## 2017-09-28 LAB — COMPLETE METABOLIC PANEL WITH GFR
AG Ratio: 1.4 (calc) (ref 1.0–2.5)
ALT: 8 U/L (ref 6–29)
AST: 13 U/L (ref 10–30)
Albumin: 4.2 g/dL (ref 3.6–5.1)
Alkaline phosphatase (APISO): 46 U/L (ref 33–115)
BUN: 13 mg/dL (ref 7–25)
CALCIUM: 9.7 mg/dL (ref 8.6–10.2)
CO2: 26 mmol/L (ref 20–32)
Chloride: 105 mmol/L (ref 98–110)
Creat: 0.82 mg/dL (ref 0.50–1.10)
GFR, Est African American: 106 mL/min/{1.73_m2} (ref >=60)
GFR, Est Non African American: 91 mL/min/{1.73_m2} (ref >=60)
GLUCOSE: 123 mg/dL — AB (ref 65–99)
Globulin: 3.1 g/dL (calc) (ref 1.9–3.7)
Potassium: 4.6 mmol/L (ref 3.5–5.3)
SODIUM: 139 mmol/L (ref 135–146)
TOTAL PROTEIN: 7.3 g/dL (ref 6.1–8.1)
Total Bilirubin: 0.3 mg/dL (ref 0.2–1.2)

## 2017-09-28 LAB — TSH: TSH: 1.61 mIU/L

## 2017-09-30 ENCOUNTER — Ambulatory Visit (INDEPENDENT_AMBULATORY_CARE_PROVIDER_SITE_OTHER): Payer: BC Managed Care – PPO | Admitting: Internal Medicine

## 2017-09-30 ENCOUNTER — Encounter: Payer: Self-pay | Admitting: Internal Medicine

## 2017-09-30 VITALS — BP 120/90 | HR 96 | Ht 65.0 in | Wt 244.0 lb

## 2017-09-30 DIAGNOSIS — Z6841 Body Mass Index (BMI) 40.0 and over, adult: Secondary | ICD-10-CM

## 2017-09-30 DIAGNOSIS — R5383 Other fatigue: Secondary | ICD-10-CM | POA: Diagnosis not present

## 2017-09-30 DIAGNOSIS — Z0000 Encounter for general adult medical examination without abnormal findings: Secondary | ICD-10-CM | POA: Diagnosis not present

## 2017-09-30 DIAGNOSIS — R0789 Other chest pain: Secondary | ICD-10-CM | POA: Diagnosis not present

## 2017-09-30 DIAGNOSIS — R7302 Impaired glucose tolerance (oral): Secondary | ICD-10-CM

## 2017-09-30 DIAGNOSIS — E559 Vitamin D deficiency, unspecified: Secondary | ICD-10-CM

## 2017-09-30 MED ORDER — ERGOCALCIFEROL 1.25 MG (50000 UT) PO CAPS: 50000.0000 [IU] | ORAL_CAPSULE | ORAL | 0 refills | Status: DC

## 2017-09-30 NOTE — Progress Notes (Signed)
   Subjective:    Patient ID: [name], female    DOB: 08-02-1980, 37 y.o.   MRN: [id number]  HPI 37 year old Black Female  In today for health maintenance exam and evaluation of medical issues. She has  no prior history of CPE since 2002 here. Health maintenance  done by GYN. Pt last seen here December 2018 for conjunctivitis.  She has impaired glucose tolerance with Hgb AIC 6%, low HDL of 44, low Vitamin D at 18, normal serum iron level, fasting serum glucose of 123.  Weight increased from 237 pounds in 2012 to 244 pounds at present.  Family history of obesity.  Last child, a son born by C-section 2017. Hx gestational diabetes.Miscarriage July 2015 with D and E. C-section 2010, a daughter.  Carpal tunnel surgery per Dr. Remo Lipps teeth extraction,Tonsillectomy. 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 2 children: a son age 52 and daughter age 57. Nonsmoker.occasional alcohol consumption.   Family Hx: Mother with history of obesity and HTN. One brother healthy.  Review of Systems  Constitutional: Positive for fatigue.  Respiratory: Positive for chest tightness.   Gastrointestinal:       History of GERD  Genitourinary: Negative.   Neurological: Negative.        Objective:   Physical Exam  Constitutional: She is oriented to person, place, and time. She appears well-developed and well-nourished.  HENT:  Head: Normocephalic and atraumatic.  Right Ear: External ear normal.  Left Ear: External ear normal.  Mouth/Throat: Oropharynx is clear and moist. No oropharyngeal exudate.  Eyes: Pupils are equal, round, and reactive to light. EOM are normal. Right eye exhibits no discharge. Left eye exhibits no discharge. No scleral icterus.  Neck: Neck supple. No JVD present. No thyromegaly present.  Cardiovascular: Normal rate, regular rhythm, normal heart sounds and intact distal  pulses. Exam reveals no friction rub.  No murmur heard. Pulmonary/Chest: Effort normal and breath sounds normal. No stridor. No respiratory distress. She has no wheezes. She has no rales.  Abdominal: Soft. Bowel sounds are normal. She exhibits no distension and no mass. There is no tenderness. There is no rebound and no guarding. No hernia.  Genitourinary:  Genitourinary Comments: Deferred to GYN  Musculoskeletal: She exhibits no edema.  Lymphadenopathy:    She has no cervical adenopathy.  Neurological: She is alert and oriented to person, place, and time. She displays normal reflexes. No cranial nerve deficit. She exhibits normal muscle tone. Coordination normal.  Skin: Skin is warm and dry. No rash noted.  Psychiatric: She has a normal mood and affect. Her behavior is normal. Judgment and thought content normal.  Vitals reviewed.         Assessment & Plan:  Vague chest discomfort not exertional- would like to see Dr, [name] Coombes who is her father's physician.  Hx GE reflux  Severe obesity- refer to Dr. Leafy Ro. BMI 40  Vitamin D deficiency- Take 2000 units D3 daily.  Impaired glucose tolerance. Must watch diet and exercise. Follow up in 6 months. Dr. Leafy Ro may elect to treat with metformin.

## 2017-10-01 LAB — IRON,TIBC AND FERRITIN PANEL
%SAT: 14 % (calc) — ABNORMAL LOW (ref 16–45)
FERRITIN: 81 ng/mL (ref 16–154)
Iron: 54 ug/dL (ref 40–190)
TIBC: 373 ug/dL (ref 250–450)

## 2017-10-01 LAB — MICROALBUMIN / CREATININE URINE RATIO
Creatinine, Urine: 177 mg/dL (ref 20–275)
Microalb Creat Ratio: 75 mcg/mg creat — ABNORMAL HIGH (ref <30)
Microalb, Ur: 13.2 mg/dL

## 2017-10-07 LAB — POCT URINALYSIS DIPSTICK
APPEARANCE: NEGATIVE
BILIRUBIN UA: NEGATIVE
Blood, UA: NEGATIVE
GLUCOSE UA: NEGATIVE
Ketones, UA: NEGATIVE
LEUKOCYTES UA: NEGATIVE
Nitrite, UA: NEGATIVE
Odor: NEGATIVE
Protein, UA: NEGATIVE
Spec Grav, UA: 1.01 (ref 1.010–1.025)
Urobilinogen, UA: 0.2 E.U./dL
pH, UA: 6 (ref 5.0–8.0)

## 2017-10-08 ENCOUNTER — Encounter: Payer: Self-pay | Admitting: Internal Medicine

## 2017-10-08 NOTE — Patient Instructions (Addendum)
Refer to Dr. [name] Coombes for evaluation. Refer to Dr. Leafy Ro for weight management and glucose intolerance. Follow up here in 6 months. Needs to watch diet and exercise.Take 2000 units Vitamin D3 daily.

## 2017-10-10 DIAGNOSIS — J029 Acute pharyngitis, unspecified: Secondary | ICD-10-CM | POA: Diagnosis not present

## 2017-10-17 DIAGNOSIS — Z0189 Encounter for other specified special examinations: Secondary | ICD-10-CM | POA: Diagnosis not present

## 2017-10-17 DIAGNOSIS — I358 Other nonrheumatic aortic valve disorders: Secondary | ICD-10-CM | POA: Diagnosis not present

## 2017-11-16 ENCOUNTER — Encounter: Payer: Self-pay | Admitting: Internal Medicine

## 2017-11-22 DIAGNOSIS — I071 Rheumatic tricuspid insufficiency: Secondary | ICD-10-CM | POA: Diagnosis not present

## 2017-11-22 DIAGNOSIS — Z0189 Encounter for other specified special examinations: Secondary | ICD-10-CM | POA: Diagnosis not present

## 2017-12-05 DIAGNOSIS — Z01419 Encounter for gynecological examination (general) (routine) without abnormal findings: Secondary | ICD-10-CM | POA: Diagnosis not present

## 2017-12-05 DIAGNOSIS — Z3040 Encounter for surveillance of contraceptives, unspecified: Secondary | ICD-10-CM | POA: Diagnosis not present

## 2017-12-05 DIAGNOSIS — Z6831 Body mass index (BMI) 31.0-31.9, adult: Secondary | ICD-10-CM | POA: Diagnosis not present

## 2017-12-27 ENCOUNTER — Other Ambulatory Visit: Payer: BC Managed Care – PPO | Admitting: Internal Medicine

## 2017-12-27 DIAGNOSIS — E559 Vitamin D deficiency, unspecified: Secondary | ICD-10-CM

## 2017-12-27 DIAGNOSIS — R7302 Impaired glucose tolerance (oral): Secondary | ICD-10-CM

## 2017-12-28 LAB — HEMOGLOBIN A1C
Hgb A1c MFr Bld: 5.8 % of total Hgb — ABNORMAL HIGH (ref <5.7)
MEAN PLASMA GLUCOSE: 120 (calc)
eAG (mmol/L): 6.6 (calc)

## 2017-12-28 LAB — VITAMIN D 25 HYDROXY (VIT D DEFICIENCY, FRACTURES): Vit D, 25-Hydroxy: 24 ng/mL — ABNORMAL LOW (ref 30–100)

## 2017-12-30 ENCOUNTER — Ambulatory Visit (INDEPENDENT_AMBULATORY_CARE_PROVIDER_SITE_OTHER): Payer: BC Managed Care – PPO | Admitting: Internal Medicine

## 2017-12-30 VITALS — BP 118/64 | HR 95 | Temp 96.8°F | Resp 16 | Wt 235.0 lb

## 2017-12-30 DIAGNOSIS — R7302 Impaired glucose tolerance (oral): Secondary | ICD-10-CM | POA: Diagnosis not present

## 2017-12-30 DIAGNOSIS — I361 Nonrheumatic tricuspid (valve) insufficiency: Secondary | ICD-10-CM | POA: Diagnosis not present

## 2017-12-30 DIAGNOSIS — Z6841 Body Mass Index (BMI) 40.0 and over, adult: Secondary | ICD-10-CM

## 2017-12-30 DIAGNOSIS — E559 Vitamin D deficiency, unspecified: Secondary | ICD-10-CM | POA: Diagnosis not present

## 2017-12-30 MED ORDER — ERGOCALCIFEROL 1.25 MG (50000 UT) PO CAPS: 50000.0000 [IU] | ORAL_CAPSULE | ORAL | 0 refills | Status: DC

## 2017-12-30 NOTE — Patient Instructions (Addendum)
Take high dose Vitamin D weekly x 12 weeks then 2000 units daily and RTC Fall 2020. Please see Dr. Leafy Ro for weight loss management.

## 2017-12-30 NOTE — Progress Notes (Signed)
   Subjective:    Patient ID: [name], female    DOB: 1980/10/09, 37 y.o.   MRN: [id number]  HPI [name] was here in September for physical examination and was found to have a low vitamin D level of 18.  She was placed on high-dose vitamin D for 3 months and is here for follow-up.  Vitamin D level is now 24 but should be over 30.  I am going to refill the vitamin D weekly 50,000 units for an additional 3 months.  Also she was found to have some mild glucose intolerance at last visit with hemoglobin A1c 5.8%.  She has a low HDL of 44.  TSH was normal at that time.  Fasting glucose was 123.  BUN and creatinine were normal and liver functions were normal.  She would benefit from Dr. [facility] clinic for weight loss.  Information provided.  She saw Dr. Einar Gip regarding her concern about heart issues.  She had an aortic murmur and he determined on echocardiogram she  had tricuspid regurgitation and he felt murmur was from that.  At the time she was having some dyspnea on exertion and some episodes of chest pain.  She was seen by GYN for annual exam in November.  She is on low-dose oral contraceptives.   Review of Systems see above     Objective:   Physical Exam        Assessment & Plan:  Vitamin D deficiency continue high-dose vitamin D weekly for an additional 3 months  Health maintenance- had flu vaccine  Cardiology will recheck in 4 months according to note they sent  Waiting on referral to Dr. Leafy Ro  Encourage diet exercise and weight loss given impaired glucose tolerance

## 2018-01-15 ENCOUNTER — Encounter: Payer: Self-pay | Admitting: Internal Medicine

## 2018-01-15 DIAGNOSIS — I071 Rheumatic tricuspid insufficiency: Secondary | ICD-10-CM | POA: Insufficient documentation

## 2018-01-31 ENCOUNTER — Encounter (INDEPENDENT_AMBULATORY_CARE_PROVIDER_SITE_OTHER): Payer: BC Managed Care – PPO

## 2018-02-06 ENCOUNTER — Ambulatory Visit (INDEPENDENT_AMBULATORY_CARE_PROVIDER_SITE_OTHER): Payer: BC Managed Care – PPO | Admitting: Bariatrics

## 2018-02-06 ENCOUNTER — Encounter (INDEPENDENT_AMBULATORY_CARE_PROVIDER_SITE_OTHER): Payer: Self-pay | Admitting: Bariatrics

## 2018-02-06 VITALS — BP 107/74 | HR 82 | Temp 98.1°F | Ht 65.0 in | Wt 233.0 lb

## 2018-02-06 DIAGNOSIS — Z6838 Body mass index (BMI) 38.0-38.9, adult: Secondary | ICD-10-CM

## 2018-02-06 DIAGNOSIS — Z9189 Other specified personal risk factors, not elsewhere classified: Secondary | ICD-10-CM

## 2018-02-06 DIAGNOSIS — K219 Gastro-esophageal reflux disease without esophagitis: Secondary | ICD-10-CM

## 2018-02-06 DIAGNOSIS — R5383 Other fatigue: Secondary | ICD-10-CM

## 2018-02-06 DIAGNOSIS — E559 Vitamin D deficiency, unspecified: Secondary | ICD-10-CM

## 2018-02-06 DIAGNOSIS — Z6835 Body mass index (BMI) 35.0-35.9, adult: Secondary | ICD-10-CM

## 2018-02-06 DIAGNOSIS — R7303 Prediabetes: Secondary | ICD-10-CM | POA: Diagnosis not present

## 2018-02-06 DIAGNOSIS — F3289 Other specified depressive episodes: Secondary | ICD-10-CM

## 2018-02-06 DIAGNOSIS — R0602 Shortness of breath: Secondary | ICD-10-CM | POA: Diagnosis not present

## 2018-02-06 DIAGNOSIS — Z1331 Encounter for screening for depression: Secondary | ICD-10-CM | POA: Diagnosis not present

## 2018-02-06 DIAGNOSIS — R011 Cardiac murmur, unspecified: Secondary | ICD-10-CM

## 2018-02-06 DIAGNOSIS — Z0289 Encounter for other administrative examinations: Secondary | ICD-10-CM

## 2018-02-07 LAB — COMPREHENSIVE METABOLIC PANEL
A/G RATIO: 1.5 (ref 1.2–2.2)
ALK PHOS: 52 IU/L (ref 39–117)
ALT: 9 IU/L (ref 0–32)
AST: 11 IU/L (ref 0–40)
Albumin: 4.3 g/dL (ref 3.8–4.8)
BILIRUBIN TOTAL: 0.3 mg/dL (ref 0.0–1.2)
BUN/Creatinine Ratio: 18 (ref 9–23)
BUN: 11 mg/dL (ref 6–20)
CALCIUM: 9.7 mg/dL (ref 8.7–10.2)
CHLORIDE: 102 mmol/L (ref 96–106)
CO2: 21 mmol/L (ref 20–29)
Creatinine, Ser: 0.61 mg/dL (ref 0.57–1.00)
GFR calc Af Amer: 133 mL/min/{1.73_m2} (ref >59)
GFR, EST NON AFRICAN AMERICAN: 115 mL/min/{1.73_m2} (ref >59)
GLOBULIN, TOTAL: 2.9 g/dL (ref 1.5–4.5)
Glucose: 99 mg/dL (ref 65–99)
POTASSIUM: 4.6 mmol/L (ref 3.5–5.2)
SODIUM: 137 mmol/L (ref 134–144)
Total Protein: 7.2 g/dL (ref 6.0–8.5)

## 2018-02-07 LAB — CBC WITH DIFFERENTIAL
BASOS ABS: 0 10*3/uL (ref 0.0–0.2)
Basos: 1 %
EOS (ABSOLUTE): 0.2 10*3/uL (ref 0.0–0.4)
Eos: 3 %
Hematocrit: 37.2 % (ref 34.0–46.6)
Hemoglobin: 12 g/dL (ref 11.1–15.9)
IMMATURE GRANS (ABS): 0 10*3/uL (ref 0.0–0.1)
IMMATURE GRANULOCYTES: 0 %
Lymphocytes Absolute: 2.1 10*3/uL (ref 0.7–3.1)
Lymphs: 29 %
MCH: 27.5 pg (ref 26.6–33.0)
MCHC: 32.3 g/dL (ref 31.5–35.7)
MCV: 85 fL (ref 79–97)
MONOCYTES: 7 %
Monocytes Absolute: 0.5 10*3/uL (ref 0.1–0.9)
NEUTROS ABS: 4.2 10*3/uL (ref 1.4–7.0)
NEUTROS PCT: 60 %
RBC: 4.36 x10E6/uL (ref 3.77–5.28)
RDW: 12.7 % (ref 11.7–15.4)
WBC: 7.1 10*3/uL (ref 3.4–10.8)

## 2018-02-07 LAB — TSH: TSH: 1.4 u[IU]/mL (ref 0.450–4.500)

## 2018-02-07 LAB — T3: T3, Total: 145 ng/dL (ref 71–180)

## 2018-02-07 LAB — VITAMIN D 25 HYDROXY (VIT D DEFICIENCY, FRACTURES): VIT D 25 HYDROXY: 26 ng/mL — AB (ref 30.0–100.0)

## 2018-02-07 LAB — INSULIN, RANDOM: INSULIN: 26.2 u[IU]/mL — AB (ref 2.6–24.9)

## 2018-02-07 LAB — T4, FREE: Free T4: 0.95 ng/dL (ref 0.82–1.77)

## 2018-02-07 NOTE — Progress Notes (Signed)
Office: [phone]  /  Fax: [phone]   Dear Dr. Renold Meyers,   Thank you for referring [name] to our clinic. The following note includes my evaluation and treatment recommendations.  HPI:   Chief Complaint: OBESITY    [name] has been referred by [name]. Courtney Genta, MD for consultation regarding her obesity and obesity related comorbidities.    [name] (MR# [id number]) is a 38 y.o. female who presents on 02/07/2018 for obesity evaluation and treatment. Current BMI is Body mass index is 38.77 kg/m.Marland Kitchen [name] has been struggling with her weight for many years and has been unsuccessful in either losing weight, maintaining weight loss, or reaching her healthy weight goal.     [name] attended our information session and states she is currently in the action stage of change and ready to dedicate time achieving and maintaining a healthier weight. [name] is interested in becoming our patient and working on intensive lifestyle modifications including (but not limited to) diet, exercise and weight loss.    [name] states her family eats meals together she thinks her family will eat healthier with her she struggles with family and or coworkers weight loss sabotage her desired weight loss is 63 to 68 lbs she has been heavy most of  her life she started gaining weight after becoming a parent her heaviest weight ever was 249 lbs. she has significant food cravings issues  she snacks frequently in the evenings she skips meals frequently she is frequently drinking liquids with calories she frequently makes poor food choices she frequently eats larger portions than normal  she states that she is an emotional eater she states that she has obstacles to cooking   Fatigue [name] feels her energy is lower than it should be. This has worsened with weight gain and has not worsened recently. [name] admits to daytime somnolence and she admits to waking up still tired. Patient is at risk  for obstructive sleep apnea. Patent has a history of symptoms of daytime fatigue and morning fatigue. Patient generally gets 7 hours of sleep per night, and states they generally have restful sleep. Snoring is not present. Apneic episodes are not present. Epworth Sleepiness Score is 6  Dyspnea on exertion [name] notes increasing shortness of breath with certain activities (climbing stairs) and seems to be worsening over time with weight gain. She notes getting out of breath sooner with activity than she used to. This has not gotten worse recently. [name] denies orthopnea.  Pre-Diabetes [name] has a diagnosis of prediabetes based on her elevated Hgb A1c and a history of gestational diabetes. She was informed this puts her at greater risk of developing diabetes. Her last A1c was at 5.8 (12/27/17). She is not taking medications currently and continues to work on diet and exercise to decrease risk of diabetes. She denies nausea or hypoglycemia.  At risk for diabetes [name] is at higher than average risk for developing diabetes due to her obesity and pre-diabetes. She currently denies polyuria or polydipsia.  Acid Reflux [name] has a diagnosis of acid reflux and she is not on medications currently. She denies nausea or vomiting.  History of Heart Murmur, tricuspid regurgitation [name] has a history of heart murmur and tricuspid regurgitation. She is not on medications currently. She denies chest pain.  Vitamin D deficiency [name] has a diagnosis of vitamin D deficiency. She is currently taking high dose vit D and denies nausea, vomiting or muscle weakness.  Depression with emotional eating behaviors [name] states  that she thinks she has "binge eating", more in her younger years, but not now. She struggles with emotional eating and using food for comfort to the extent that it is negatively impacting her health. She often snacks when she is not hungry. [name] sometimes feels she is out of control and  then feels guilty that she made poor food choices. She is attempting to work on behavior modification techniques to help reduce her emotional eating. She shows no sign of suicidal or homicidal ideations.  Depression Screen [name]'s Food and Mood (modified PHQ-9) score was  Depression screen PHQ 2/9 02/06/2018  Decreased Interest 2  Down, Depressed, Hopeless 1  PHQ - 2 Score 3  Altered sleeping 1  Tired, decreased energy 3  Change in appetite 3  Feeling bad or failure about yourself  2  Trouble concentrating 1  Moving slowly or fidgety/restless 0  Suicidal thoughts 0  PHQ-9 Score 13  Difficult doing work/chores Not difficult at all    ASSESSMENT AND PLAN:  Other fatigue - Plan: EKG 12-Lead, CBC With Differential, T3, T4, free, TSH  SOB (shortness of breath) on exertion - Plan: CBC With Differential, T3, T4, free, TSH  Prediabetes - Plan: Comprehensive metabolic panel, Insulin, random  Gastroesophageal reflux disease without esophagitis  Heart murmur - history of with tricuspid regurgitation  Vitamin D deficiency - Plan: VITAMIN D 25 Hydroxy (Vit-D Deficiency, Fractures)  Other depression - with emotional eating   Depression screening  At risk for diabetes mellitus  Class 2 severe obesity with serious comorbidity and body mass index (BMI) of 38.0 to 38.9 in adult, unspecified obesity type (HCC)  PLAN:  Fatigue [name] was informed that her fatigue may be related to obesity, depression or many other causes. Labs will be ordered, and in the meanwhile [name] has agreed to work on diet, exercise and weight loss to help with fatigue. Proper sleep hygiene was discussed including the need for 7-8 hours of quality sleep each night. A sleep study was not ordered based on symptoms and Epworth score.  Dyspnea on exertion [name] shortness of breath appears to be obesity related and exercise induced. She has agreed to work on weight loss and gradually increase exercise to treat her  exercise induced shortness of breath. If [name] follows our instructions and loses weight without improvement of her shortness of breath, we will plan to refer to pulmonology. We will monitor this condition regularly. Sahmya agrees to this plan.  Pre-Diabetes [name] will continue to work on weight loss, exercise, and decreasing refined carbohydrates in her diet to help decrease the risk of diabetes. She was informed that eating too many simple carbohydrates or too many calories at one sitting increases the likelihood of GI side effects. We will check insulin level today and [name] agreed to follow up with [place] as directed to monitor her progress.  Diabetes risk counseling Debar was given extended (15 minutes) diabetes prevention counseling today. She is 38 y.o. female and has risk factors for diabetes including obesity and pre-diabetes. We discussed intensive lifestyle modifications today with an emphasis on weight loss as well as increasing exercise and decreasing simple carbohydrates in her diet.  Acid Reflux [name] will work on decreasing triggers. She will follow up with our clinic in 2 weeks.  History of Heart Murmur, tricuspid regurgitation [name] will follow up with her cardiologist. She will follow up with our clinic in 2 weeks.  Vitamin D Deficiency [name] was informed that low vitamin D levels contributes to fatigue  and are associated with obesity, breast, and colon cancer. She will continue to take prescription Vit D @50 ,000 IU every week and will follow up for routine testing of vitamin D, at least 2-3 times per year. She was informed of the risk of over-replacement of vitamin D and agrees to not increase her dose unless she discusses this with [place] first.  Depression with Emotional Eating Behaviors We discussed behavior modification techniques today to help [name] deal with her emotional eating and depression. We will refer to Dr. Mallie Mussel our bariatric psychologist.  Depression  Screen [name] had a moderately positive depression screening. Depression is commonly associated with obesity and often results in emotional eating behaviors. We will monitor this closely and work on CBT to help improve the non-hunger eating patterns.  Obesity [name] is currently in the action stage of change and her goal is to continue with weight loss efforts. I recommend [name] begin the structured treatment plan as follows:  She has agreed to follow the Category 2 plan Maudean has been instructed to eventually work up to a goal of 150 minutes of combined cardio and strengthening exercise per week for weight loss and overall health benefits. We discussed the following Behavioral Modification Strategies today: increase H2O intake, keeping healthy foods in the home, better snacking choices, increasing lean protein intake, decreasing simple carbohydrates, increasing vegetables, work on meal planning and easy cooking plans and emotional eating strategies.  Cassidi will work on Barista.   She was informed of the importance of frequent follow up visits to maximize her success with intensive lifestyle modifications for her multiple health conditions. She was informed we would discuss her lab results at her next visit unless there is a critical issue that needs to be addressed sooner. Yohana agreed to keep her next visit at the agreed upon time to discuss these results.  ALLERGIES: No Known Allergies  MEDICATIONS: Current Outpatient Medications on File Prior to Visit  Medication Sig Dispense Refill  . ergocalciferol (DRISDOL) 1.25 MG (50000 UT) capsule Take 1 capsule (50,000 Units total) by mouth once a week. 12 capsule 0  . ketoconazole (NIZORAL) 2 % shampoo Apply 1 application topically 2 (two) times a week.    . LO LOESTRIN FE 1 MG-10 MCG / 10 MCG tablet Take 28 mg by mouth daily.  11  . naproxen sodium (ANAPROX) 550 MG tablet Take 550 mg by mouth as needed.     No current  facility-administered medications on file prior to visit.     PAST MEDICAL HISTORY: Past Medical History:  Diagnosis Date  . GERD (gastroesophageal reflux disease)    not currently  . Gestational diabetes mellitus (GDM), antepartum   . Heart murmur   . HSV infection   . Leaky heart valve   . Prediabetes   . Vitamin D deficiency     PAST SURGICAL HISTORY: Past Surgical History:  Procedure Laterality Date  . CESAREAN SECTION    . CESAREAN SECTION N/A 04/21/2015   Procedure: CESAREAN SECTION;  Surgeon: Waymon Amato, MD;  Location: [facility];  Service: Obstetrics;  Laterality: N/A;  . DILATION AND EVACUATION N/A 08/09/2013   Procedure: DILATATION AND EVACUATION;  Surgeon: Betsy Coder, MD;  Location: [facility];  Service: Gynecology;  Laterality: N/A;  . excision of pilonidal cyst    . EXCISION OF SKIN TAG Right 08/09/2013   Procedure: EXCISION OF SKIN TAG on right buttock;  Surgeon: Betsy Coder, MD;  Location: [facility];  Service: Gynecology;  Laterality: Right;  This procedure was added on after the patient had gone to sleep (as per Dr. Charlesetta Garibaldi)  . OPERATIVE ULTRASOUND N/A 08/09/2013   Procedure: OPERATIVE ULTRASOUND;  Surgeon: Betsy Coder, MD;  Location: [facility];  Service: Gynecology;  Laterality: N/A;  . TONSILLECTOMY      SOCIAL HISTORY: Social History   Tobacco Use  . Smoking status: Never Smoker  . Smokeless tobacco: Never Used  Substance Use Topics  . Alcohol use: Yes    Comment: rarely  . Drug use: No    FAMILY HISTORY: Family History  Problem Relation Age of Onset  . Hypertension Mother   . Sleep apnea Mother   . Obesity Mother   . Diabetes Father   . High Cholesterol Father   . Cancer Father     ROS: Review of Systems  Constitutional: Positive for malaise/fatigue.  Respiratory: Positive for shortness of breath (on exertion).   Cardiovascular: Negative for chest pain and orthopnea.  Gastrointestinal: Negative for nausea and vomiting.  Genitourinary: Negative  for frequency.  Musculoskeletal:       Negative for muscle weakness  Endo/Heme/Allergies: Negative for polydipsia.       Negative for hypoglycemia  Psychiatric/Behavioral: Positive for depression. Negative for suicidal ideas.    PHYSICAL EXAM: Blood pressure 107/74, pulse 82, temperature 98.1 F (36.7 C), temperature source Oral, height 5\' 5"  (1.651 m), weight 233 lb (105.7 kg), SpO2 98 %, unknown if currently breastfeeding. Body mass index is 38.77 kg/m. Physical Exam Vitals signs reviewed.  Constitutional:      Appearance: Normal appearance. She is well-developed. She is obese.  HENT:     Head: Normocephalic and atraumatic.     Nose: Nose normal.     Mouth/Throat:     Comments: Mallampati = 3 Eyes:     General: No scleral icterus.    Extraocular Movements: Extraocular movements intact.  Neck:     Musculoskeletal: Normal range of motion and neck supple.     Thyroid: No thyromegaly.  Cardiovascular:     Rate and Rhythm: Normal rate and regular rhythm.  Pulmonary:     Effort: Pulmonary effort is normal. No respiratory distress.  Abdominal:     Palpations: Abdomen is soft.     Tenderness: There is no abdominal tenderness.  Musculoskeletal: Normal range of motion.     Comments: Range of Motion is normal in all 4 extremities  Skin:    General: Skin is warm and dry.  Neurological:     Mental Status: She is alert and oriented to person, place, and time.     Coordination: Coordination normal.  Psychiatric:        Mood and Affect: Mood normal.        Behavior: Behavior normal.        Thought Content: Thought content does not include homicidal or suicidal ideation.     RECENT LABS AND TESTS: BMET    Component Value Date/Time   NA 137 02/06/2018 1201   K 4.6 02/06/2018 1201   CL 102 02/06/2018 1201   CO2 21 02/06/2018 1201   GLUCOSE 99 02/06/2018 1201   GLUCOSE 123 (H) 09/27/2017 0913   BUN 11 02/06/2018 1201   CREATININE 0.61 02/06/2018 1201   CREATININE 0.82  09/27/2017 0913   CALCIUM 9.7 02/06/2018 1201   GFRNONAA 115 02/06/2018 1201   GFRNONAA 91 09/27/2017 0913   GFRAA 133 02/06/2018 1201   GFRAA 106 09/27/2017 0913   Lab Results  Component Value  Date   HGBA1C 5.8 (H) 12/27/2017   Lab Results  Component Value Date   INSULIN 26.2 (H) 02/06/2018   CBC    Component Value Date/Time   WBC 7.1 02/06/2018 1201   WBC 7.0 09/27/2017 0913   RBC 4.36 02/06/2018 1201   RBC 4.19 09/27/2017 0913   HGB 12.0 02/06/2018 1201   HCT 37.2 02/06/2018 1201   PLT 367 09/27/2017 0913   MCV 85 02/06/2018 1201   MCH 27.5 02/06/2018 1201   MCH 27.7 09/27/2017 0913   MCHC 32.3 02/06/2018 1201   MCHC 33.0 09/27/2017 0913   RDW 12.7 02/06/2018 1201   LYMPHSABS 2.1 02/06/2018 1201   MONOABS 0.7 02/26/2014 1246   EOSABS 0.2 02/06/2018 1201   BASOSABS 0.0 02/06/2018 1201   Iron/TIBC/Ferritin/ %Sat    Component Value Date/Time   IRON 54 09/30/2017 1607   TIBC 373 09/30/2017 1607   FERRITIN 81 09/30/2017 1607   IRONPCTSAT 14 (L) 09/30/2017 1607   Lipid Panel     Component Value Date/Time   CHOL 149 09/27/2017 0913   TRIG 81 09/27/2017 0913   HDL 44 (L) 09/27/2017 0913   CHOLHDL 3.4 09/27/2017 0913   LDLCALC 88 09/27/2017 0913   Hepatic Function Panel     Component Value Date/Time   PROT 7.2 02/06/2018 1201   ALBUMIN 4.3 02/06/2018 1201   AST 11 02/06/2018 1201   ALT 9 02/06/2018 1201   ALKPHOS 52 02/06/2018 1201   BILITOT 0.3 02/06/2018 1201      Component Value Date/Time   TSH 1.400 02/06/2018 1201   TSH 1.61 09/27/2017 0913   TSH 1.611 09/30/2011 1300     Ref. Range 12/27/2017 09:41  Vitamin D, 25-Hydroxy Latest Ref Range: 30 - 100 ng/mL 24 (L)    ECG  shows NSR with a rate of 76 BPM INDIRECT CALORIMETER done today shows a VO2 of 225 and a REE of 1565.  Her calculated basal metabolic rate is 3149 thus her basal metabolic rate is worse than expected.     OBESITY BEHAVIORAL INTERVENTION VISIT  Today's visit was # 1    Starting weight: 233 lbs Starting date: 02/06/2018 Today's weight : 233 lbs  Today's date: 02/06/2018 Total lbs lost to date: 0   ASK: We discussed the diagnosis of obesity with Richardean Sale today and [name] agreed to give [place] permission to discuss obesity behavioral modification therapy today.  ASSESS: Desree has the diagnosis of obesity and her BMI today is 38.77 Dietrich is in the action stage of change   ADVISE: Aalani was educated on the multiple health risks of obesity as well as the benefit of weight loss to improve her health. She was advised of the need for long term treatment and the importance of lifestyle modifications to improve her current health and to decrease her risk of future health problems.  AGREE: Multiple dietary modification options and treatment options were discussed and  Serenitee agreed to follow the recommendations documented in the above note.  ARRANGE: Yemaya was educated on the importance of frequent visits to treat obesity as outlined per CMS and USPSTF guidelines and agreed to schedule her next follow up appointment today.  Corey Skains, am acting as Location manager for [employer]. Owens Shark, DO  I have reviewed the above documentation for accuracy and completeness, and I agree with the above. -Jearld Lesch, DO

## 2018-02-08 DIAGNOSIS — Z6836 Body mass index (BMI) 36.0-36.9, adult: Secondary | ICD-10-CM

## 2018-02-08 DIAGNOSIS — Z6835 Body mass index (BMI) 35.0-35.9, adult: Secondary | ICD-10-CM

## 2018-02-08 DIAGNOSIS — R011 Cardiac murmur, unspecified: Secondary | ICD-10-CM | POA: Insufficient documentation

## 2018-02-08 DIAGNOSIS — E66812 Obesity, class 2: Secondary | ICD-10-CM

## 2018-02-08 DIAGNOSIS — E6609 Other obesity due to excess calories: Secondary | ICD-10-CM

## 2018-02-08 DIAGNOSIS — Z6838 Body mass index (BMI) 38.0-38.9, adult: Secondary | ICD-10-CM | POA: Insufficient documentation

## 2018-02-08 DIAGNOSIS — R7303 Prediabetes: Secondary | ICD-10-CM | POA: Insufficient documentation

## 2018-02-08 HISTORY — DX: Other obesity due to excess calories: E66.09

## 2018-02-08 HISTORY — DX: Obesity, class 2: E66.812

## 2018-02-08 HISTORY — DX: Body mass index (BMI) 35.0-35.9, adult: Z68.35

## 2018-02-20 ENCOUNTER — Ambulatory Visit (INDEPENDENT_AMBULATORY_CARE_PROVIDER_SITE_OTHER): Payer: BC Managed Care – PPO | Admitting: Bariatrics

## 2018-02-20 ENCOUNTER — Encounter (INDEPENDENT_AMBULATORY_CARE_PROVIDER_SITE_OTHER): Payer: Self-pay | Admitting: Bariatrics

## 2018-02-20 VITALS — BP 123/79 | HR 71 | Temp 98.3°F | Ht 65.0 in | Wt 228.0 lb

## 2018-02-20 DIAGNOSIS — Z6838 Body mass index (BMI) 38.0-38.9, adult: Secondary | ICD-10-CM

## 2018-02-20 DIAGNOSIS — F3289 Other specified depressive episodes: Secondary | ICD-10-CM

## 2018-02-20 DIAGNOSIS — E559 Vitamin D deficiency, unspecified: Secondary | ICD-10-CM

## 2018-02-20 DIAGNOSIS — Z9189 Other specified personal risk factors, not elsewhere classified: Secondary | ICD-10-CM

## 2018-02-20 DIAGNOSIS — R7303 Prediabetes: Secondary | ICD-10-CM

## 2018-02-20 MED ORDER — METFORMIN HCL 500 MG PO TABS: 500.0000 mg | ORAL_TABLET | Freq: Every day | ORAL | 0 refills | Status: DC

## 2018-02-20 NOTE — Progress Notes (Unsigned)
Office: [phone]  /  Fax: [phone]    Date: February 21, 2018  Time Seen: *** Duration: *** Provider: Glennie Isle, PsyD Type of Session: Intake for Individual Therapy  Type of Contact: Face-to-face  Informed Consent:The provider's role was explained to [name]. The provider reviewed and discussed issues of confidentiality, privacy, and limits therein. In addition to verbal informed consent, written informed consent for psychological services was obtained from [name] prior to the initial intake interview. Written consent included information concerning the practice, financial arrangements, and confidentiality and patients' rights. Since the clinic is not a 24/7 crisis center, mental health emergency resources were shared in the form of a handout, and the provider explained MyChart, e-mail, voicemail, and/or other messaging systems should be utilized only for non-emergency reasons. [name] verbally acknowledged understanding of the aforementioned, and agreed to use mental health emergency resources discussed if needed. Moreover, [name] agreed information may be shared with other [facility] providers as needed for coordination of care, and written consent was obtained.   Chief Complaint: [name] was referred by Dr. Jearld Lesch due to depression with emotional eating behaviors. Per the note for the visit with Dr. Jearld Lesch on February 06, 2018, "[name] states that [name] thinks [name] has "binge eating", more in her younger years, but not now. [name] struggles with emotional eating and using food for comfort to the extent that it is negatively impacting her health. [name] often snacks when [name] is not hungry. [name] sometimes feels [name] is out of control and then feels guilty that [name] made poor food choices. [name] is attempting to work on behavior modification techniques to help reduce her emotional eating. [name] shows no sign of suicidal or homicidal ideations."  [name] was asked  to complete a questionnaire assessing various behaviors related to emotional eating. [name] endorsed the following: {gbmoodandfood:21755}.   HPI: Per the note for the initial visit with Dr. Jearld Lesch on February 06, 2018, [name] has been heavy most of her life, and [name] started gaining weight after becoming a parent. During the initial appointment with Dr. Jearld Lesch, [name] reported experiencing the following: significant food cravings issues , snacking frequently in the evenings, frequently drinking liquids with calories, frequently making poor food choices, frequently eating larger portions than normal  and skipping meals frequently. [name] states [name] is an [name], and [name] has obstacles to cooking.  Mental Status Examination: [name] arrived on time for the appointment. [name] presented as appropriately dressed and groomed. [name] appeared her stated age and demonstrated adequate orientation to time, place, person, and purpose of the appointment. [name] also demonstrated appropriate eye contact. No psychomotor abnormalities or behavioral peculiarities noted. Her mood was {gbmood:21757} with congruent affect. Her thought processes were logical, linear, and goal-directed. No hallucinations, delusions, bizarre thinking or behavior reported or observed. Judgment, insight, and impulse control appeared to be grossly intact. There was no evidence of paraphasias (i.e., errors in speech, gross mispronunciations, and word substitutions), repetition deficits, or disturbances in volume or prosody (i.e., rhythm and intonation). There was no evidence of attention or memory impairments. [name] denied current suicidal and homicidal ideation, plan, and intent.   The Mini-Mental State Examination, Second Edition (MMSE-2) was administered. The MMSE-2 briefly screens for cognitive dysfunction and overall mental status and assesses different cognitive domains: orientation, registration, attention and calculation, recall, and  language and praxis. [name] received *** out of 30 points possible on the MMSE-2, which is noted in the *** range.   Family &  Psychosocial History: ***  Medical History: ***  Mental Health History: ***  [name] denied a trauma history, including {gbtrauma:22071} abuse, as well as neglect. ***  [name] reported experiencing the following: {gbintakesxs:21966}  [name] reported experiencing worry thoughts regarding the following:   [name] denied experiencing the following: {gbsxs:21965}  [name] also denied history of and current suicidal ideation, plan, and intent; history of and current homicidal ideation, plan, and intent; and history of and current engagement in self-harm.  The following strengths were reported by [name]:  The following strengths were observed by this provider: {gbstrengths:22223}.  Structured Assessment Results: The Patient Health Questionnaire-9 (PHQ-9) is a self-report measure that assesses symptoms and severity of depression over the course of the last two weeks. Jayelyn obtained a score of *** suggesting {GBPHQ9SEVERITY:21752}. Da finds the endorsed symptoms to be {gbphq9difficulty:21754}.    The Generalized Anxiety Disorder-7 (GAD-7) is a brief self-report measure that assesses symptoms of anxiety over the course of the last two weeks. Joneisha obtained a score of *** suggesting {gbgad7severity:21753}.  Interventions: A chart review was conducted prior to the clinical intake interview. The MMSE-2, PHQ-9, and GAD-7 were administered and a clinical intake interview was completed. In addition, Estrellita was asked to complete a Mood and Food questionnaire to assess various behaviors related to emotional eating. Throughout session, empathic reflections and validation was provided. Continuing treatment with this provider was discussed and a treatment goal was established. Psychoeducation regarding emotional versus physical hunger was provided. Stephaney was given a handout to  utilize between now and the next appointment to increase awareness of hunger patterns and subsequent eating. ***  Provisional DSM-5 Diagnosis: ***  Plan: Mykira appears able and willing to participate as evidenced by collaboration on a treatment goal, engagement in reciprocal conversation, and asking questions as needed for clarification. The next appointment will be scheduled in {gbweeks:21758}. The following treatment goal was established: {gbtxgoals:21759}. For the aforementioned goal, Aliah can benefit from biweekly individual therapy sessions that are brief in duration for approximately four to six sessions. The treatment modality will be individual therapeutic services, including an eclectic therapeutic approach utilizing techniques from Cognitive Behavioral Therapy, Patient Centered Therapy, Dialectical Behavior Therapy, Acceptance and Commitment Therapy, Interpersonal Therapy, and Cognitive Restructuring. Therapeutic approach will include various interventions as appropriate, such as validation, support, mindfulness, thought defusion, reframing, psychoeducation, values assessment, and role playing. This provider will regularly review the treatment plan and medical chart to keep informed of status changes. Ily expressed understanding and agreement with the initial treatment plan of care.

## 2018-02-21 ENCOUNTER — Ambulatory Visit (INDEPENDENT_AMBULATORY_CARE_PROVIDER_SITE_OTHER): Payer: BC Managed Care – PPO | Admitting: Psychology

## 2018-02-21 NOTE — Progress Notes (Signed)
Office: [phone]  /  Fax: [phone]   HPI:   Chief Complaint: OBESITY [name] is here to discuss her progress with her obesity treatment plan. She is on the Category 2 plan and is following her eating plan approximately 90 % of the time. She states she is exercising 0 minutes 0 times per week. [name] is doing well overall. She does not like yogurt and she is not eating the "yolk" of eggs. She found she was more hungry if eating the microwave diet.  Her weight is 228 lb (103.4 kg) today and has had a weight loss of 5 pounds over a period of 2 weeks since her last visit. She has lost 5 lbs since starting treatment with [place].  Vitamin D deficiency [name] has a diagnosis of vitamin D deficiency. [name] is currently taking high dose vit D once weekly and she denies nausea, vomiting or muscle weakness.  Pre-Diabetes [name] has a diagnosis of prediabetes based on her elevated Hgb A1c and was informed this puts her at greater risk of developing diabetes. Her last A1c was at 5.8 (12/27/17) and last insulin level was at 26.2 She is not taking medications currently and continues to work on diet and exercise to decrease risk of diabetes. She admits polyphagia.  At risk for diabetes [name] is at higher than average risk for developing diabetes due to her obesity and prediabetes. She currently denies polyuria or polydipsia.  Depression with emotional eating behaviors [name] was referred to Dr. Mallie Meyers. She struggles with emotional eating and using food for comfort to the extent that it is negatively impacting her health. She often snacks when she is not hungry. [name] sometimes feels she is out of control and then feels guilty that she made poor food choices. She has been working on behavior modification techniques to help reduce her emotional eating and has been somewhat successful. She shows no sign of suicidal or homicidal ideations.  Depression screen [name] 2/9 02/06/2018 09/30/2017 01/30/2015    Decreased Interest 2 0 0  Down, Depressed, Hopeless 1 0 0  PHQ - 2 Score 3 0 0  Altered sleeping 1 - -  Tired, decreased energy 3 - -  Change in appetite 3 - -  Feeling bad or failure about yourself  2 - -  Trouble concentrating 1 - -  Moving slowly or fidgety/restless 0 - -  Suicidal thoughts 0 - -  PHQ-9 Score 13 - -  Difficult doing work/chores Not difficult at all - -     ASSESSMENT AND PLAN:  Vitamin D deficiency  Prediabetes - Plan: metFORMIN (GLUCOPHAGE) 500 MG tablet  Other depression - with emotional eating   At risk for diabetes mellitus  Class 2 severe obesity with serious comorbidity and body mass index (BMI) of 38.0 to 38.9 in adult, unspecified obesity type (HCC)  PLAN:  Vitamin D Deficiency [name] was informed that low vitamin D levels contributes to fatigue and are associated with obesity, breast, and colon cancer. She agrees to continue to take prescription Vit D @50 ,000 IU every week and will follow up for routine testing of vitamin D, at least 2-3 times per year. She was informed of the risk of over-replacement of vitamin D and agrees to not increase her dose unless she discusses this with [place] first.  Pre-Diabetes [name] will continue to work on weight loss, exercise, and decreasing simple carbohydrates in her diet to help decrease the risk of diabetes. We dicussed metformin including benefits and risks. She was  informed that eating too many simple carbohydrates or too many calories at one sitting increases the likelihood of GI side effects. [name] agreed to start metformin 500 mg once daily with breakfast #30 with no refills and follow up with [place] as directed to monitor her progress.  Diabetes risk counseling [name] was given extended (15 minutes) diabetes prevention counseling today. She is 38 y.o. female and has risk factors for diabetes including obesity and prediabetes. We discussed intensive lifestyle modifications today with an emphasis on weight loss as  well as increasing exercise and decreasing simple carbohydrates in her diet.  Depression with Emotional Eating Behaviors We discussed behavior modification techniques today to help [name] deal with her emotional eating and depression. She will see Dr. Mallie Meyers our bariatric psychologist tomorrow.  Obesity [name] is currently in the action stage of change. As such, her goal is to continue with weight loss efforts She has agreed to follow the Category 2 plan [name] has been instructed to work up to a goal of 150 minutes of combined cardio and strengthening exercise per week for weight loss and overall health benefits. We discussed the following Behavioral Modification Strategies today: increase H2O intake, keeping healthy foods in the home, better snacking choices, increasing lean protein intake, decreasing simple carbohydrates, increasing vegetables, work on meal planning and easy cooking plans, emotional eating strategies, ways to avoid boredom eating and ways to avoid night time snacking  [name] has agreed to follow up with our clinic in 2 weeks. She was informed of the importance of frequent follow up visits to maximize her success with intensive lifestyle modifications for her multiple health conditions.  ALLERGIES: No Known Allergies  MEDICATIONS: Current Outpatient Medications on File Prior to Visit  Medication Sig Dispense Refill  . ergocalciferol (DRISDOL) 1.25 MG (50000 UT) capsule Take 1 capsule (50,000 Units total) by mouth once a week. 12 capsule 0  . ketoconazole (NIZORAL) 2 % shampoo Apply 1 application topically 2 (two) times a week.    . LO LOESTRIN FE 1 MG-10 MCG / 10 MCG tablet Take 28 mg by mouth daily.  11  . naproxen sodium (ANAPROX) 550 MG tablet Take 550 mg by mouth as needed.     No current facility-administered medications on file prior to visit.     PAST MEDICAL HISTORY: Past Medical History:  Diagnosis Date  . GERD (gastroesophageal reflux disease)    not  currently  . Gestational diabetes mellitus (GDM), antepartum   . Heart murmur   . HSV infection   . Leaky heart valve   . Prediabetes   . Vitamin D deficiency     PAST SURGICAL HISTORY: Past Surgical History:  Procedure Laterality Date  . CESAREAN SECTION    . CESAREAN SECTION N/A 04/21/2015   Procedure: CESAREAN SECTION;  Surgeon: Waymon Amato, MD;  Location: [facility];  Service: Obstetrics;  Laterality: N/A;  . DILATION AND EVACUATION N/A 08/09/2013   Procedure: DILATATION AND EVACUATION;  Surgeon: Betsy Coder, MD;  Location: [facility];  Service: Gynecology;  Laterality: N/A;  . excision of pilonidal cyst    . EXCISION OF SKIN TAG Right 08/09/2013   Procedure: EXCISION OF SKIN TAG on right buttock;  Surgeon: Betsy Coder, MD;  Location: [facility];  Service: Gynecology;  Laterality: Right;  This procedure was added on after the patient had gone to sleep (as per Dr. Charlesetta Garibaldi)  . OPERATIVE ULTRASOUND N/A 08/09/2013   Procedure: OPERATIVE ULTRASOUND;  Surgeon: Betsy Coder, MD;  Location:  [facility];  Service: Gynecology;  Laterality: N/A;  . TONSILLECTOMY      SOCIAL HISTORY: Social History   Tobacco Use  . Smoking status: Never Smoker  . Smokeless tobacco: Never Used  Substance Use Topics  . Alcohol use: Yes    Comment: rarely  . Drug use: No    FAMILY HISTORY: Family History  Problem Relation Age of Onset  . Hypertension Mother   . Sleep apnea Mother   . Obesity Mother   . Diabetes Father   . High Cholesterol Father   . Cancer Father     ROS: Review of Systems  Constitutional: Positive for weight loss.  Gastrointestinal: Negative for nausea and vomiting.  Genitourinary: Negative for frequency.  Musculoskeletal:       Negative for muscle weakness  Endo/Heme/Allergies: Negative for polydipsia.       Positive for polyphagia  Psychiatric/Behavioral: Positive for depression. Negative for suicidal ideas.    PHYSICAL EXAM: Blood pressure 123/79, pulse 71, temperature 98.3  F (36.8 C), temperature source Oral, height 5\' 5"  (1.651 m), weight 228 lb (103.4 kg), SpO2 98 %, unknown if currently breastfeeding. Body mass index is 37.94 kg/m. Physical Exam Vitals signs reviewed.  Constitutional:      Appearance: Normal appearance. She is well-developed. She is obese.  Cardiovascular:     Rate and Rhythm: Normal rate.  Pulmonary:     Effort: Pulmonary effort is normal.  Musculoskeletal: Normal range of motion.  Skin:    General: Skin is warm and dry.  Neurological:     Mental Status: She is alert and oriented to person, place, and time.  Psychiatric:        Mood and Affect: Mood normal.        Behavior: Behavior normal.        Thought Content: Thought content does not include homicidal or suicidal ideation.     RECENT LABS AND TESTS: BMET    Component Value Date/Time   NA 137 02/06/2018 1201   K 4.6 02/06/2018 1201   CL 102 02/06/2018 1201   CO2 21 02/06/2018 1201   GLUCOSE 99 02/06/2018 1201   GLUCOSE 123 (H) 09/27/2017 0913   BUN 11 02/06/2018 1201   CREATININE 0.61 02/06/2018 1201   CREATININE 0.82 09/27/2017 0913   CALCIUM 9.7 02/06/2018 1201   GFRNONAA 115 02/06/2018 1201   GFRNONAA 91 09/27/2017 0913   GFRAA 133 02/06/2018 1201   GFRAA 106 09/27/2017 0913   Lab Results  Component Value Date   HGBA1C 5.8 (H) 12/27/2017   HGBA1C 6.0 (H) 09/27/2017   HGBA1C 5.9 (H) 09/30/2011   Lab Results  Component Value Date   INSULIN 26.2 (H) 02/06/2018   CBC    Component Value Date/Time   WBC 7.1 02/06/2018 1201   WBC 7.0 09/27/2017 0913   RBC 4.36 02/06/2018 1201   RBC 4.19 09/27/2017 0913   HGB 12.0 02/06/2018 1201   HCT 37.2 02/06/2018 1201   PLT 367 09/27/2017 0913   MCV 85 02/06/2018 1201   MCH 27.5 02/06/2018 1201   MCH 27.7 09/27/2017 0913   MCHC 32.3 02/06/2018 1201   MCHC 33.0 09/27/2017 0913   RDW 12.7 02/06/2018 1201   LYMPHSABS 2.1 02/06/2018 1201   MONOABS 0.7 02/26/2014 1246   EOSABS 0.2 02/06/2018 1201   BASOSABS  0.0 02/06/2018 1201   Iron/TIBC/Ferritin/ %Sat    Component Value Date/Time   IRON 54 09/30/2017 1607   TIBC 373 09/30/2017 1607   FERRITIN 81  09/30/2017 1607   IRONPCTSAT 14 (L) 09/30/2017 1607   Lipid Panel     Component Value Date/Time   CHOL 149 09/27/2017 0913   TRIG 81 09/27/2017 0913   HDL 44 (L) 09/27/2017 0913   CHOLHDL 3.4 09/27/2017 0913   LDLCALC 88 09/27/2017 0913   Hepatic Function Panel     Component Value Date/Time   PROT 7.2 02/06/2018 1201   ALBUMIN 4.3 02/06/2018 1201   AST 11 02/06/2018 1201   ALT 9 02/06/2018 1201   ALKPHOS 52 02/06/2018 1201   BILITOT 0.3 02/06/2018 1201      Component Value Date/Time   TSH 1.400 02/06/2018 1201   TSH 1.61 09/27/2017 0913   TSH 1.611 09/30/2011 1300     Ref. Range 02/06/2018 12:01  Vitamin D, 25-Hydroxy Latest Ref Range: 30.0 - 100.0 ng/mL 26.0 (L)     OBESITY BEHAVIORAL INTERVENTION VISIT  Today's visit was # 2   Starting weight: 233 lbs Starting date: 02/06/2018 Today's weight : 228 lbs Today's date: 02/20/2018 Total lbs lost to date: 5   ASK: We discussed the diagnosis of obesity with [name] today and [name] agreed to give [place] permission to discuss obesity behavioral modification therapy today.  ASSESS: [name] has the diagnosis of obesity and her BMI today is 37.94 [name] is in the action stage of change   ADVISE: Manjot was educated on the multiple health risks of obesity as well as the benefit of weight loss to improve her health. She was advised of the need for long term treatment and the importance of lifestyle modifications to improve her current health and to decrease her risk of future health problems.  AGREE: Multiple dietary modification options and treatment options were discussed and  [name] agreed to follow the recommendations documented in the above note.  ARRANGE: [name] was educated on the importance of frequent visits to treat obesity as outlined per CMS and USPSTF  guidelines and agreed to schedule her next follow up appointment today.  Corey Skains, am acting as Location manager for [employer]. Owens Shark, DO  I have reviewed the above documentation for accuracy and completeness, and I agree with the above. -Jearld Lesch, DO

## 2018-03-02 ENCOUNTER — Ambulatory Visit (INDEPENDENT_AMBULATORY_CARE_PROVIDER_SITE_OTHER): Payer: BC Managed Care – PPO | Admitting: Internal Medicine

## 2018-03-02 ENCOUNTER — Ambulatory Visit (INDEPENDENT_AMBULATORY_CARE_PROVIDER_SITE_OTHER): Payer: BC Managed Care – PPO | Admitting: Psychology

## 2018-03-02 ENCOUNTER — Encounter: Payer: Self-pay | Admitting: Internal Medicine

## 2018-03-02 VITALS — BP 120/80 | HR 80 | Temp 98.6°F | Ht 65.0 in | Wt 228.0 lb

## 2018-03-02 DIAGNOSIS — J0100 Acute maxillary sinusitis, unspecified: Secondary | ICD-10-CM

## 2018-03-02 DIAGNOSIS — F3289 Other specified depressive episodes: Secondary | ICD-10-CM

## 2018-03-02 DIAGNOSIS — H6693 Otitis media, unspecified, bilateral: Secondary | ICD-10-CM

## 2018-03-02 MED ORDER — DOXYCYCLINE HYCLATE 100 MG PO TABS: 100.0000 mg | ORAL_TABLET | Freq: Two times a day (BID) | ORAL | 0 refills | Status: DC

## 2018-03-02 NOTE — Patient Instructions (Signed)
Take doxycycline 100 mg twice daily for 10 days.  Rest and drink plenty of fluids.

## 2018-03-02 NOTE — Progress Notes (Signed)
   Subjective:    Patient ID: [name], female    DOB: 08-Feb-1980, 38 y.o.   MRN: [id number]  HPI 38 year old Female in today with sinus congestion, malaise and fatigue.  Her daughter has been ill.  Patient initially developed a scratchy sore throat on Saturday, February 8 and then developed nasal congestion.   Review of Systems has been going to healthy weight loss clinic and is lost 5 pounds already.     Objective:   Physical Exam Sounds nasally congested.  Pharynx is clear without exudate.  Neck is supple without adenopathy.  Chest is clear to auscultation without rales or wheezing.  Skin is warm and dry.  Vital signs reviewed.  She is afebrile.  TMs are dull and pink bilaterally.       Assessment & Plan:  Acute maxillary sinusitis  Bilateral otitis media  Plan: Doxycycline 100 mg twice daily for 10 days.  Rest and drink plenty of fluids.

## 2018-03-02 NOTE — Progress Notes (Signed)
Office: [phone]  /  Fax: [phone]    Date:March 02, 2018  Time Seen: 2:00pm Duration: 50 mintues Provider: Glennie Isle, PsyD Type of Session: Intake for Individual Therapy  Type of Contact: Face-to-face  Informed Consent:The provider's role was explained to [name]. The provider reviewed and discussed issues of confidentiality, privacy, and limits therein. In addition to verbal informed consent, written informed consent for psychological services was obtained from [facility] prior to the initial intake interview. Written consent included information concerning the practice, financial arrangements, and confidentiality and patients' rights. Since the clinic is not a 24/7 crisis center, mental health emergency resources were shared in the form of a handout, and the provider explained MyChart, e-mail, voicemail, and/or other messaging systems should be utilized only for non-emergency reasons. Shanda verbally acknowledged understanding of the aforementioned, and agreed to use mental health emergency resources discussed if needed. Moreover, Lama agreed information may be shared with other [facility] providers as needed for coordination of care, and written consent was obtained.   Chief Complaint: Arpi was referred by Dr. Jearld Lesch due to depression with emotional eating behaviors. Per the note for the visit with Dr. Jearld Lesch on February 06, 2018, "Luva states that she thinks she has "binge eating", more in her younger years, but not now. She struggles with emotional eating and using food for comfort to the extent that it is negatively impacting her health. She often snacks when she is not hungry. Evanna sometimes feels she is out of control and then feels guilty that she made poor food choices. She is attempting to work on behavior modification techniques to help reduce her emotional eating. She shows no sign of suicidal or homicidal ideations."  During  today's appointment, Lucielle indicated, "I want to make a lifestyle change." She further added, "I am an emotional eater. I just want tools." Regarding the currently prescribed structured meal plan, Neri discussed she is starting to get "bored;" however, she indicated she lost 5 pounds. Additionally, she shared she tends to crave sweets, including cookies, brownies, and ice cream. She noted her last episode of emotional eating was prior to the holidays secondary to stress.  Marnisha was asked to complete a questionnaire assessing various behaviors related to emotional eating. Ruvi endorsed the following: overeat when you are celebrating, experience food cravings on a regular basis, eat certain foods when you are anxious, stressed, depressed, or your feelings are hurt, use food to help you cope with emotional situations, find food is comforting to you, overeat when you are angry or upset, not worry about what you eat when you are in a good mood, overeat when you are alone, but eat much less when you are with other people and eat as a reward.  HPI: Per the note for the initial visit with Dr. Jearld Lesch on February 06, 2018, Mattalynn started gaining weight after becoming a parent, and her heaviest weight ever was 249 lbs. During the initial appointment with Dr. Jearld Lesch, [name] reported experiencing the following: significant food cravings issues , snacking frequently in the evenings, frequently drinking liquids with calories, frequently making poor food choices, frequently eating larger portions than normal  and skipping meals frequently. 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 During today's appointment, Jaylanni indicated the onset of emotional eating was in high school, and she noted that is when she started gaining weight. She indicated the aforementioned was likely secondary to her relationship  changing with her father due to his extramarital affairs. Winston also  shared she was often rewarded during her childhood with food. She denied a history of purging, and engagement in other compensatory strategies and has never been diagnosed with a eating disorder. Moreover, Rayvn shared, "My family is big. Anytime we do anything, there is food involved."  Mental Status Examination: Aeon arrived on time for the appointment. She presented as appropriately dressed and groomed. Saia appeared her stated age and demonstrated adequate orientation to time, place, person, and purpose of the appointment. She also demonstrated appropriate eye contact. No psychomotor abnormalities or behavioral peculiarities noted. Her mood was euthymic with congruent affect; however, she was observed becoming tearful when discussing eating habits. Marijean also shared she was "not feeling well." Her thought processes were logical, linear, and goal-directed. No hallucinations, delusions, bizarre thinking or behavior reported or observed. Judgment, insight, and impulse control appeared to be grossly intact. There was no evidence of paraphasias (i.e., errors in speech, gross mispronunciations, and word substitutions), repetition deficits, or disturbances in volume or prosody (i.e., rhythm and intonation). There was no evidence of attention or memory impairments. Ashayla denied current suicidal and homicidal ideation, plan, and intent.   Family & Psychosocial History: Liani shared she has been married for 13 years and has 2 children, ages 48 and 80. She noted she is employed full-time as an Web designer at [employer]. She shared her highest level of education obtained is a Manufacturing engineer of arts degree. Ashya indicated her social support system consists of her 2 close friends, husband, mother, brother, father, and sister-in-law.  She shared she identifies with Christianity and attends church approximately twice a month.  Medical History:  Past Medical History:  Diagnosis Date  . GERD  (gastroesophageal reflux disease)    not currently  . Gestational diabetes mellitus (GDM), antepartum   . Heart murmur   . HSV infection   . Leaky heart valve   . Prediabetes   . Vitamin D deficiency    Past Surgical History:  Procedure Laterality Date  . CESAREAN SECTION    . CESAREAN SECTION N/A 04/21/2015   Procedure: CESAREAN SECTION;  Surgeon: Waymon Amato, MD;  Location: [place] [facility];  Service: Obstetrics;  Laterality: N/A;  . DILATION AND EVACUATION N/A 08/09/2013   Procedure: DILATATION AND EVACUATION;  Surgeon: Betsy Coder, MD;  Location: [place]  [facility];  Service: Gynecology;  Laterality: N/A;  . excision of pilonidal cyst    . EXCISION OF SKIN TAG Right 08/09/2013   Procedure: EXCISION OF SKIN TAG on right buttock;  Surgeon: Betsy Coder, MD;  Location: [place] [facility];  Service: Gynecology;  Laterality: Right;  This procedure was added on after the patient had gone to sleep (as per Dr. Charlesetta Garibaldi)  . OPERATIVE ULTRASOUND N/A 08/09/2013   Procedure: OPERATIVE ULTRASOUND;  Surgeon: Betsy Coder, MD;  Location: [place] [facility];  Service: Gynecology;  Laterality: N/A;  . TONSILLECTOMY     Current Outpatient Medications on File Prior to Visit  Medication Sig Dispense Refill  . ergocalciferol (DRISDOL) 1.25 MG (50000 UT) capsule Take 1 capsule (50,000 Units total) by mouth once a week. 12 capsule 0  . ketoconazole (NIZORAL) 2 % shampoo Apply 1 application topically 2 (two) times a week.    . LO LOESTRIN FE 1 MG-10 MCG / 10 MCG tablet Take 28 mg by mouth daily.  11  . metFORMIN (GLUCOPHAGE) 500 MG tablet Take 1 tablet (500 mg total) by mouth daily with breakfast. 30  tablet 0  . naproxen sodium (ANAPROX) 550 MG tablet Take 550 mg by mouth as needed.     No current facility-administered medications on file prior to visit.   Ellamarie denied a history of head injuries and loss of consciousness.   Mental Health History: Brooklee reports she has never received therapeutic or psychiatric services. She also denied a  history of hospitalizations for psychiatric concerns. However, she shared in 2005 she was prescribed Celexa by her primary care physician. Zareah explained, "I had a lot going on. My dad was sick."  Around 2008, her primary care physician prescribed Wellbutrin due to symptoms of "anxiety." Regarding family history of mental health related concerns, she indicated her paternal aunts have suffered "nervous breakdowns." Bena denied a trauma history, including psychological, physical  and sexual abuse, as well as neglect.    Merie reported her mood is typically "pretty laid back." She endorsed experiencing fatigue secondary to motherhood, decreased self-esteem due to her weight, and worry thoughts regarding her health. Lasonja denied experiencing the following: hopelessness, sleep difficulties, appetite issues, decreased self-esteem, attention and concentration issues, memory concerns, irritability, obsessions and compulsions, hallucinations and delusions, mania, angry outbursts, substance use and social withdrawal. She also denied history of and current suicidal ideation, plan, and intent; history of and current homicidal ideation, plan, and intent; and history of and current engagement in self-harm.  The following strengths were reported by [name]: organized, caring, and good mother. The following strengths were observed by this provider: ability to express thoughts and feelings during the therapeutic session, ability to establish and benefit from a therapeutic relationship, ability to learn and practice coping skills, willingness to work toward established goal(s) with the clinic and ability to engage in reciprocal conversation.  Legal History: Novalynn denied a history of legal involvement.   Structured Assessment Results: The Patient Health Questionnaire-9 (PHQ-9) is a self-report measure that assesses symptoms and severity of depression over the course of the last two weeks. Yanelle obtained a score of  zero. Depression screen [facility] 2/9 03/02/2018  Decreased Interest 0  Down, Depressed, Hopeless 0  PHQ - 2 Score 0  Altered sleeping 0  Tired, decreased energy 0  Change in appetite 0  Feeling bad or failure about yourself  0  Trouble concentrating 0  Moving slowly or fidgety/restless 0  Suicidal thoughts 0  PHQ-9 Score 0  Difficult doing work/chores -   The Generalized Anxiety Disorder-7 (GAD-7) is a brief self-report measure that assesses symptoms of anxiety over the course of the last two weeks. Mayla obtained a score of zero. GAD 7 : Generalized Anxiety Score 03/02/2018  Nervous, Anxious, on Edge 0  Control/stop worrying 0  Worry too much - different things 0  Trouble relaxing 0  Restless 0  Easily annoyed or irritable 0  Afraid - awful might happen 0  Total GAD 7 Score 0   Interventions: A chart review was conducted prior to the clinical intake interview. The PHQ-9, and GAD-7 were administered and a clinical intake interview was completed. In addition, Aline was asked to complete a Mood and Food questionnaire to assess various behaviors related to emotional eating. Throughout session, empathic reflections and validation was provided. Continuing treatment with this provider was discussed and a treatment goal was established. Psychoeducation regarding emotional versus physical hunger was provided. Yoland was given a handout to utilize between now and the next appointment to increase awareness of hunger patterns and subsequent eating.   Provisional DSM-5 Diagnosis: 311 (F32.8) Other Specified Depressive  Disorder, Emotional Eating Behaviors  Plan: Tamya appears able and willing to participate as evidenced by collaboration on a treatment goal, engagement in reciprocal conversation, and asking questions as needed for clarification. The next appointment will be scheduled in two weeks. The following treatment goal was established: decrease emotional eating. For the aforementioned goal, Tomorrow  can benefit from biweekly individual therapy sessions that are brief in duration for approximately four to six sessions. The treatment modality will be individual therapeutic services, including an eclectic therapeutic approach utilizing techniques from Cognitive Behavioral Therapy, Patient Centered Therapy, Dialectical Behavior Therapy, Acceptance and Commitment Therapy, Interpersonal Therapy, and Cognitive Restructuring. Therapeutic approach will include various interventions as appropriate, such as validation, support, mindfulness, thought defusion, reframing, psychoeducation, values assessment, and role playing. This provider will regularly review the treatment plan and medical chart to keep informed of status changes. Kary expressed understanding and agreement with the initial treatment plan of care.

## 2018-03-08 ENCOUNTER — Ambulatory Visit (INDEPENDENT_AMBULATORY_CARE_PROVIDER_SITE_OTHER): Payer: BC Managed Care – PPO | Admitting: Bariatrics

## 2018-03-08 VITALS — BP 117/72 | HR 90 | Temp 98.3°F | Ht 65.0 in | Wt 225.0 lb

## 2018-03-08 DIAGNOSIS — Z6837 Body mass index (BMI) 37.0-37.9, adult: Secondary | ICD-10-CM | POA: Diagnosis not present

## 2018-03-08 DIAGNOSIS — E559 Vitamin D deficiency, unspecified: Secondary | ICD-10-CM | POA: Diagnosis not present

## 2018-03-08 DIAGNOSIS — R7303 Prediabetes: Secondary | ICD-10-CM | POA: Diagnosis not present

## 2018-03-08 NOTE — Progress Notes (Signed)
Office: [phone]  /  Fax: [phone]   HPI:   Chief Complaint: OBESITY [name] is here to discuss her progress with her obesity treatment plan. She is on the Category 2 plan and is following her eating plan approximately 85% of the time. She states she is exercising 0 minutes 0 times per week. [name] is struggling with breakfast - too many eggs and meat. She is bored with her breakfast. Last week she was sick and did not want to eat. Her weight is 225 lb (102.1 kg) today and has had a weight loss of 3 pounds over a period of 2 weeks since her last visit. She has lost 8 lbs since starting treatment with [place].  Pre-Diabetes [name] has a diagnosis of prediabetes based on her elevated Hgb A1c and was informed this puts her at greater risk of developing diabetes. She is taking metformin currently and continues to work on diet and exercise to decrease risk of diabetes.   Vitamin D deficiency [name] has a diagnosis of Vitamin D deficiency. She is currently taking high dose Vit D.  ASSESSMENT AND PLAN:  Prediabetes  Vitamin D deficiency  Class 2 severe obesity with serious comorbidity and body mass index (BMI) of 37.0 to 37.9 in adult, unspecified obesity type [facility])  PLAN:  Pre-Diabetes [name] will continue to work on weight loss, exercise, and decreasing simple carbohydrates in her diet to help decrease the risk of diabetes. We dicussed metformin including benefits and risks. She was informed that eating too many simple carbohydrates or too many calories at one sitting increases the likelihood of GI side effects. Mahrukh will continue metformin for now. [name] agreed to follow-up with [place] as directed to monitor her progress.  Vitamin D Deficiency [name] was informed that low Vitamin D levels contributes to fatigue and are associated with obesity, breast, and colon cancer. She agrees to continue taking Vit D and will follow-up for routine testing of Vitamin D, at least 2-3 times per year.  She was informed of the risk of over-replacement of Vitamin D and agrees to not increase her dose unless she discusses this with [place] first. [name] agrees to follow-up with our clinic in 2 weeks.  I spent > than 50% of the 15 minute visit on counseling as documented in the note.  Obesity [name] is currently in the action stage of change. As such, her goal is to continue with weight loss efforts. She has agreed to follow the Category 2 plan. She was advised to increase protein other than eggs and [place] sausage; cheese and rolled up meat. Additional lunch options handout was given. [name] has been instructed to work up to a goal of 150 minutes of combined cardio and strengthening exercise per week for weight loss and overall health benefits. We discussed the following Behavioral Modification Strategies today: increasing lean protein intake, decreasing simple carbohydrates , increasing vegetables, no skipping meals, and work on meal planning and easy cooking plans.  [name] has agreed to follow-up with our clinic in 2 weeks. She was informed of the importance of frequent follow up visits to maximize her success with intensive lifestyle modifications for her multiple health conditions.  ALLERGIES: Allergies  Allergen Reactions  . Other     MEDICATIONS: Current Outpatient Medications on File Prior to Visit  Medication Sig Dispense Refill  . doxycycline (VIBRA-TABS) 100 MG tablet Take 1 tablet (100 mg total) by mouth 2 (two) times daily. 20 tablet 0  . ergocalciferol (DRISDOL) 1.25 MG (50000 UT)  capsule Take 1 capsule (50,000 Units total) by mouth once a week. 12 capsule 0  . ketoconazole (NIZORAL) 2 % shampoo Apply 1 application topically 2 (two) times a week.    . LO LOESTRIN FE 1 MG-10 MCG / 10 MCG tablet Take 28 mg by mouth daily.  11  . metFORMIN (GLUCOPHAGE) 500 MG tablet Take 1 tablet (500 mg total) by mouth daily with breakfast. 30 tablet 0  . naproxen sodium (ANAPROX) 550 MG tablet Take  550 mg by mouth as needed.     No current facility-administered medications on file prior to visit.     PAST MEDICAL HISTORY: Past Medical History:  Diagnosis Date  . GERD (gastroesophageal reflux disease)    not currently  . Gestational diabetes mellitus (GDM), antepartum   . Heart murmur   . HSV infection   . Leaky heart valve   . Prediabetes   . Vitamin D deficiency     PAST SURGICAL HISTORY: Past Surgical History:  Procedure Laterality Date  . CESAREAN SECTION    . CESAREAN SECTION N/A 04/21/2015   Procedure: CESAREAN SECTION;  Surgeon: Waymon Amato, MD;  Location: [place] [facility];  Service: Obstetrics;  Laterality: N/A;  . DILATION AND EVACUATION N/A 08/09/2013   Procedure: DILATATION AND EVACUATION;  Surgeon: Betsy Coder, MD;  Location: [place] [facility];  Service: Gynecology;  Laterality: N/A;  . excision of pilonidal cyst    . EXCISION OF SKIN TAG Right 08/09/2013   Procedure: EXCISION OF SKIN TAG on right buttock;  Surgeon: Betsy Coder, MD;  Location: [place] [facility];  Service: Gynecology;  Laterality: Right;  This procedure was added on after the patient had gone to sleep (as per Dr. Charlesetta Garibaldi)  . OPERATIVE ULTRASOUND N/A 08/09/2013   Procedure: OPERATIVE ULTRASOUND;  Surgeon: Betsy Coder, MD;  Location: [facility];  Service: Gynecology;  Laterality: N/A;  . TONSILLECTOMY      SOCIAL HISTORY: Social History   Tobacco Use  . Smoking status: Never Smoker  . Smokeless tobacco: Never Used  Substance Use Topics  . Alcohol use: Yes    Comment: rarely  . Drug use: No    FAMILY HISTORY: Family History  Problem Relation Age of Onset  . Hypertension Mother   . Sleep apnea Mother   . Obesity Mother   . Diabetes Father   . High Cholesterol Father   . Cancer Father    ROS: Review of Systems  Constitutional: Positive for weight loss.  Endo/Heme/Allergies:       Negative for hypoglycemia.   PHYSICAL EXAM: Blood pressure 117/72, pulse 90, temperature 98.3 F (36.8 C), temperature source  Oral, height 5\' 5"  (1.651 m), weight 225 lb (102.1 kg), SpO2 98 %, unknown if currently breastfeeding. Body mass index is 37.44 kg/m. Physical Exam Vitals signs reviewed.  Constitutional:      Appearance: Normal appearance. She is obese.  Cardiovascular:     Rate and Rhythm: Normal rate.     Pulses: Normal pulses.  Pulmonary:     Effort: Pulmonary effort is normal.     Breath sounds: Normal breath sounds.  Musculoskeletal: Normal range of motion.  Skin:    General: Skin is warm and dry.  Neurological:     Mental Status: She is alert and oriented to person, place, and time.  Psychiatric:        Behavior: Behavior normal.   RECENT LABS AND TESTS: BMET    Component Value Date/Time   NA 137 02/06/2018 1201  K 4.6 02/06/2018 1201   CL 102 02/06/2018 1201   CO2 21 02/06/2018 1201   GLUCOSE 99 02/06/2018 1201   GLUCOSE 123 (H) 09/27/2017 0913   BUN 11 02/06/2018 1201   CREATININE 0.61 02/06/2018 1201   CREATININE 0.82 09/27/2017 0913   CALCIUM 9.7 02/06/2018 1201   GFRNONAA 115 02/06/2018 1201   GFRNONAA 91 09/27/2017 0913   GFRAA 133 02/06/2018 1201   GFRAA 106 09/27/2017 0913   Lab Results  Component Value Date   HGBA1C 5.8 (H) 12/27/2017   HGBA1C 6.0 (H) 09/27/2017   HGBA1C 5.9 (H) 09/30/2011   Lab Results  Component Value Date   INSULIN 26.2 (H) 02/06/2018   CBC    Component Value Date/Time   WBC 7.1 02/06/2018 1201   WBC 7.0 09/27/2017 0913   RBC 4.36 02/06/2018 1201   RBC 4.19 09/27/2017 0913   HGB 12.0 02/06/2018 1201   HCT 37.2 02/06/2018 1201   PLT 367 09/27/2017 0913   MCV 85 02/06/2018 1201   MCH 27.5 02/06/2018 1201   MCH 27.7 09/27/2017 0913   MCHC 32.3 02/06/2018 1201   MCHC 33.0 09/27/2017 0913   RDW 12.7 02/06/2018 1201   LYMPHSABS 2.1 02/06/2018 1201   MONOABS 0.7 02/26/2014 1246   EOSABS 0.2 02/06/2018 1201   BASOSABS 0.0 02/06/2018 1201   Iron/TIBC/Ferritin/ %Sat    Component Value Date/Time   IRON 54 09/30/2017 1607   TIBC 373  09/30/2017 1607   FERRITIN 81 09/30/2017 1607   IRONPCTSAT 14 (L) 09/30/2017 1607   Lipid Panel     Component Value Date/Time   CHOL 149 09/27/2017 0913   TRIG 81 09/27/2017 0913   HDL 44 (L) 09/27/2017 0913   CHOLHDL 3.4 09/27/2017 0913   LDLCALC 88 09/27/2017 0913   Hepatic Function Panel     Component Value Date/Time   PROT 7.2 02/06/2018 1201   ALBUMIN 4.3 02/06/2018 1201   AST 11 02/06/2018 1201   ALT 9 02/06/2018 1201   ALKPHOS 52 02/06/2018 1201   BILITOT 0.3 02/06/2018 1201      Component Value Date/Time   TSH 1.400 02/06/2018 1201   TSH 1.61 09/27/2017 0913   TSH 1.611 09/30/2011 1300    Ref. Range 02/06/2018 12:01  Vitamin D, 25-Hydroxy Latest Ref Range: 30.0 - 100.0 ng/mL 26.0 (L)   OBESITY BEHAVIORAL INTERVENTION VISIT  Today's visit was #3  Starting weight: 233 lbs Starting date: 02/06/2018 Today's weight: 225 lbs Today's date: 03/08/2018 Total lbs lost to date: 8  ASK: We discussed the diagnosis of obesity with Richardean Sale today and [name] agreed to give [place] permission to discuss obesity behavioral modification therapy today.  ASSESS: Denika has the diagnosis of obesity and her BMI today is 37.44. Jolicia is in the action stage of change.   ADVISE: Alynn was educated on the multiple health risks of obesity as well as the benefit of weight loss to improve her health. She was advised of the need for long term treatment and the importance of lifestyle modifications to improve her current health and to decrease her risk of future health problems.  AGREE: Multiple dietary modification options and treatment options were discussed and  Kensli agreed to follow the recommendations documented in the above note.  ARRANGE: Rheanne was educated on the importance of frequent visits to treat obesity as outlined per CMS and USPSTF guidelines and agreed to schedule her next follow up appointment today.  Migdalia Dk, am acting as Location manager for  Jearld Lesch, DO  I have reviewed the above documentation for accuracy and completeness, and I agree with the above. -Jearld Lesch, DO

## 2018-03-09 ENCOUNTER — Encounter (INDEPENDENT_AMBULATORY_CARE_PROVIDER_SITE_OTHER): Payer: Self-pay | Admitting: Bariatrics

## 2018-03-14 NOTE — Progress Notes (Signed)
Office: [phone]  /  Fax: [phone]    Date: March 16, 2018   Time Seen: 12:00pm Duration: 25 minutes Provider: Glennie Isle, Psy.D. Type of Session: Individual Therapy  Type of Contact: Face-to-face  Session Content: [name] is a 38 y.o. female presenting for a follow-up appointment to address the previously established treatment goal of decreasing emotional eating. The session was initiated with the administration of the PHQ-9 and GAD-7, as well as a brief check-in. Since the last appointment with this provider, [name] reported eating things not on her meal plan due to being "bored" with the meal plan. However, she noted she spoke with [name] about being bored during their appointment on March 08, 2018, and she was provided with other options. Additionally, she discussed disappointment with her recent weight loss. Thus, brief psychoeducation regarding healthy weight loss was provided. Regarding physical versus emotional hunger, [name] discussed specific scenarios where she experienced emotional hunger, and noted making choices congruent to her meal plan. Furthermore, psychoeducation regarding triggers for emotional eating was provided. [name] was provided a handout, and encouraged to utilize the handout between now and the next appointment to increase awareness of triggers and frequency. [name] agreed. This provider also discussed behavioral strategies for specific triggers, such as placing the utensil down when conversing to avoid mindless eating.   [name] was receptive to today's session as evidenced by openness to sharing, responsiveness to feedback, and willingness to identify her triggers for emotional eating.  Mental Status Examination: [name] arrived on time for the appointment. She presented as appropriately dressed and groomed. [name] appeared her stated age and demonstrated adequate orientation to time, place, person, and purpose of the appointment. She also demonstrated  appropriate eye contact. No psychomotor abnormalities or behavioral peculiarities noted. Her mood was euthymic with congruent affect. Her thought processes were logical, linear, and goal-directed. No hallucinations, delusions, bizarre thinking or behavior reported or observed. Judgment, insight, and impulse control appeared to be grossly intact. There was no evidence of paraphasias (i.e., errors in speech, gross mispronunciations, and word substitutions), repetition deficits, or disturbances in volume or prosody (i.e., rhythm and intonation). There was no evidence of attention or memory impairments. [name] denied current suicidal and homicidal ideation, plan and intent.   Structured Assessment Results: The Patient Health Questionnaire-9 (PHQ-9) is a self-report measure that assesses symptoms and severity of depression over the course of the last two weeks. [name] obtained a score of zero. Depression screen [facility] 2/9 03/16/2018  Decreased Interest 0  Down, Depressed, Hopeless 0  PHQ - 2 Score 0  Altered sleeping 0  Tired, decreased energy 0  Change in appetite 0  Feeling bad or failure about yourself  0  Trouble concentrating 0  Moving slowly or fidgety/restless 0  Suicidal thoughts 0  PHQ-9 Score 0  Difficult doing work/chores -   The Generalized Anxiety Disorder-7 (GAD-7) is a brief self-report measure that assesses symptoms of anxiety over the course of the last two weeks. [name] obtained a score of zero. GAD 7 : Generalized Anxiety Score 03/16/2018  Nervous, Anxious, on Edge 0  Control/stop worrying 0  Worry too much - different things 0  Trouble relaxing 0  Restless 0  Easily annoyed or irritable 0  Afraid - awful might happen 0  Total GAD 7 Score 0   Interventions:  Administration of PHQ-9 and GAD-7 for symptom monitoring Review of content from the previous session Empathic reflections and validation Psychoeducation regarding triggers for emotional eating Positive  reinforcement Rapport building Brief  chart review  Psychoeducation regarding healthy weight loss  DSM-5 Diagnosis: 311 (F32.8) Other Specified Depressive Disorder, Emotional Eating Behaviors  Treatment Goal & Progress: During the initial appointment with this provider, the following treatment goal was established: decrease emotional eating. Progress is limited, as [name] has just begun treatment with this provider; however, she is receptive to the interaction and interventions and rapport is being established. Nevertheless,  [name] has demonstrated progress in her goal as evidenced by increased awareness of hunger patterns.  Plan: [name] continues to appear able and willing to participate as evidenced by engagement in reciprocal conversation, and asking questions for clarification as appropriate. The next appointment will be scheduled in two weeks. The next session will focus on reviewing triggers for emotional eating, and working towards the established treatment goal.

## 2018-03-16 ENCOUNTER — Ambulatory Visit (INDEPENDENT_AMBULATORY_CARE_PROVIDER_SITE_OTHER): Payer: BC Managed Care – PPO | Admitting: Psychology

## 2018-03-16 DIAGNOSIS — F3289 Other specified depressive episodes: Secondary | ICD-10-CM

## 2018-03-21 ENCOUNTER — Encounter: Payer: Self-pay | Admitting: Cardiology

## 2018-03-21 ENCOUNTER — Ambulatory Visit (INDEPENDENT_AMBULATORY_CARE_PROVIDER_SITE_OTHER): Payer: BC Managed Care – PPO | Admitting: Cardiology

## 2018-03-21 VITALS — BP 109/65 | HR 72 | Ht 65.0 in | Wt 225.3 lb

## 2018-03-21 DIAGNOSIS — I272 Pulmonary hypertension, unspecified: Secondary | ICD-10-CM | POA: Diagnosis not present

## 2018-03-21 DIAGNOSIS — I361 Nonrheumatic tricuspid (valve) insufficiency: Secondary | ICD-10-CM | POA: Diagnosis not present

## 2018-03-21 DIAGNOSIS — Z6837 Body mass index (BMI) 37.0-37.9, adult: Secondary | ICD-10-CM

## 2018-03-21 NOTE — Progress Notes (Signed)
Subjective:   [name], female    DOB: [birth year], 38 y.o.   MRN: [id number]  Elby Showers, MD:  Chief Complaint  Patient presents with  . Other    Tricuspid Regurg, 4 mth fu, last EKG 02/06/18  . Obesity    HPI: [name]  is a 38 y.o. female  with Vitamin D deficiency, gestational diabetes, mild to moderate tricuspid regurgitation and slightly elevated PA pressure by echocardiogram in Oct 2019 felt to be related to obesity.  She now presents for 3 month follow up. She is now participating in the bariatric weight loss program at [facility].   Previously noted complaints of dyspnea on exertion have resolved. She has had an episode of chest pain a few weeks ago that was precipitated with certain movements and felt to be musculoskeletal. No specific complaints today.   Patient is without history of hypertension or hyperlipidemia. No history of rheumatic fever. No history of tobacco use.  Past Medical History:  Diagnosis Date  . GERD (gastroesophageal reflux disease)    not currently  . Gestational diabetes mellitus (GDM), antepartum   . Heart murmur   . HSV infection   . Leaky heart valve   . Prediabetes   . Vitamin D deficiency     Past Surgical History:  Procedure Laterality Date  . CESAREAN SECTION    . CESAREAN SECTION N/A 04/21/2015   Procedure: CESAREAN SECTION;  Surgeon: Waymon Amato, MD;  Location: [facility];  Service: Obstetrics;  Laterality: N/A;  . DILATION AND EVACUATION N/A 08/09/2013   Procedure: DILATATION AND EVACUATION;  Surgeon: Betsy Coder, MD;  Location: [facility];  Service: Gynecology;  Laterality: N/A;  . excision of pilonidal cyst    . EXCISION OF SKIN TAG Right 08/09/2013   Procedure: EXCISION OF SKIN TAG on right buttock;  Surgeon: Betsy Coder, MD;  Location: [facility];  Service: Gynecology;  Laterality: Right;  This procedure was added on after the patient had gone to sleep (as per Dr. Charlesetta Garibaldi)  . OPERATIVE ULTRASOUND N/A 08/09/2013   Procedure: OPERATIVE ULTRASOUND;  Surgeon: Betsy Coder, MD;  Location: [facility];  Service: Gynecology;  Laterality: N/A;  . TONSILLECTOMY      Family History  Problem Relation Age of Onset  . Hypertension Mother   . Sleep apnea Mother   . Obesity Mother   . Diabetes Father   . High Cholesterol Father   . Cancer Father   . Hypertension Brother   . Diabetes Brother     Social History   Socioeconomic History  . Marital status: Married    Spouse name: Richard  . Number of children: 2  . Years of education: Not on file  . Highest education level: Bachelor's degree (e.g., BA, AB, BS)  Occupational History  . Occupation: Visual merchandiser  Social Needs  . Financial resource strain: Not on file  . Food insecurity:    Worry: Not on file    Inability: Not on file  . Transportation needs:    Medical: Not on file    Non-medical: Not on file  Tobacco Use  . Smoking status: Never Smoker  . Smokeless tobacco: Never Used  Substance and Sexual Activity  . Alcohol use: Yes    Comment: rarely  . Drug use: No  . Sexual activity: Yes    Birth control/protection: None  Lifestyle  . Physical activity:    Days per week: Not on file  Minutes per session: Not on file  . Stress: Not on file  Relationships  . Social connections:    Talks on phone: Not on file    Gets together: Not on file    Attends religious service: Not on file    Active member of club or organization: Not on file    Attends meetings of clubs or organizations: Not on file    Relationship status: Not on file  . Intimate partner violence:    Fear of current or ex partner: Not on file    Emotionally abused: Not on file    Physically abused: Not on file    Forced sexual activity: Not on file  Other Topics Concern  . Not on file  Social History Narrative  . Not on file    Current Meds  Medication Sig  . ergocalciferol (DRISDOL) 1.25 MG (50000 UT) capsule Take 1 capsule (50,000 Units total) by mouth once  a week.  Marland Kitchen ketoconazole (NIZORAL) 2 % shampoo Apply 1 application topically 2 (two) times a week.  . LO LOESTRIN FE 1 MG-10 MCG / 10 MCG tablet Take 28 mg by mouth daily.  . metFORMIN (GLUCOPHAGE) 500 MG tablet Take 1 tablet (500 mg total) by mouth daily with breakfast.  . naproxen sodium (ANAPROX) 550 MG tablet Take 550 mg by mouth as needed.     Review of Systems  Constitution: Negative for decreased appetite, malaise/fatigue, weight gain and weight loss.  Eyes: Negative for visual disturbance.  Cardiovascular: Negative for chest pain, claudication, dyspnea on exertion, leg swelling, orthopnea, palpitations and syncope.  Respiratory: Negative for hemoptysis and wheezing.   Endocrine: Negative for cold intolerance and heat intolerance.  Hematologic/Lymphatic: Does not bruise/bleed easily.  Skin: Negative for nail changes.  Musculoskeletal: Negative for muscle weakness and myalgias.  Gastrointestinal: Negative for abdominal pain, change in bowel habit, nausea and vomiting.  Neurological: Negative for difficulty with concentration, dizziness, focal weakness and headaches.  Psychiatric/Behavioral: Negative for altered mental status and suicidal ideas.  All other systems reviewed and are negative.      Objective:     Blood pressure 109/65, pulse 72, height 5' 5" (1.651 m), weight 225 lb 4.8 oz (102.2 kg), SpO2 96 %, unknown if currently breastfeeding.  Echocardiogram 11/16/2017: Left ventricle cavity is normal in size. Normal global wall motion. Calculated EF 56%. Left atrial cavity is mildly dilated. Mild to moderate, eccentric tricuspid regurgitation. Estimated pulmonary artery systolic pressure 34 mmHg. IVC is normal with blunted respiratory response. Estimated RA pressure 8 mmHg. Murmur likely due to tricuspid regurgitation.  Labs 09/27/2017: Hemoglobin A1c 6%. Vitamin D low at 18. TSH 1.6. Cholesterol 149, triglycerides 81, HDL 44, LDL 88. Creatinine 0.82, EGFR 91/106, potassium  4.6, CMP normal. RBC 3.84, hemoglobin 11.6, RDW 16.2%, CBC otherwise normal. Iron saturation 14%, iron panel otherwise normal.   Physical Exam  Constitutional: She is oriented to person, place, and time. Vital signs are normal. She appears well-developed and well-nourished.  Moderately obese  HENT:  Head: Normocephalic and atraumatic.  Neck: Normal range of motion.  Cardiovascular: Normal rate, regular rhythm and intact distal pulses.  Murmur heard.  Early systolic murmur is present with a grade of 1/6 at the upper right sternal border. Pulmonary/Chest: Effort normal and breath sounds normal. No accessory muscle usage. No respiratory distress.  Abdominal: Soft. Bowel sounds are normal.  Musculoskeletal: Normal range of motion.  Neurological: She is alert and oriented to person, place, and time.  Skin: Skin is warm  and dry.  Vitals reviewed.          Assessment & Recommendations:   1. Nonrheumatic tricuspid valve regurgitation Suspect is secondary to her obesity. No clinical evidence of heart failure. No changes to physical exam.   2. Pulmonary hypertension ([place]) Secondary to obesity. Do not suspect primary pulmonary hypertension. Will consider repeating her echocardiogram in 6 months if she has had weight loss.   3. Class 2 severe obesity due to excess calories with serious comorbidity and body mass index (BMI) of 37.0 to 37.9 in adult [facility]) Currently working with bariatric clinic, and has lost some weight. Stressed importance of continued efforts.   I will see her back in 6 months or sooner if problems.     Jeri Lager, FNP-C [facility], [place] Office: [phone] Fax: [phone]

## 2018-03-23 ENCOUNTER — Encounter (INDEPENDENT_AMBULATORY_CARE_PROVIDER_SITE_OTHER): Payer: Self-pay | Admitting: Bariatrics

## 2018-03-23 ENCOUNTER — Ambulatory Visit (INDEPENDENT_AMBULATORY_CARE_PROVIDER_SITE_OTHER): Payer: BC Managed Care – PPO | Admitting: Bariatrics

## 2018-03-23 VITALS — BP 105/61 | HR 87 | Temp 98.1°F | Ht 65.0 in | Wt 219.0 lb

## 2018-03-23 DIAGNOSIS — R7303 Prediabetes: Secondary | ICD-10-CM | POA: Diagnosis not present

## 2018-03-23 DIAGNOSIS — E559 Vitamin D deficiency, unspecified: Secondary | ICD-10-CM | POA: Diagnosis not present

## 2018-03-23 DIAGNOSIS — Z9189 Other specified personal risk factors, not elsewhere classified: Secondary | ICD-10-CM | POA: Diagnosis not present

## 2018-03-23 DIAGNOSIS — Z6836 Body mass index (BMI) 36.0-36.9, adult: Secondary | ICD-10-CM

## 2018-03-27 NOTE — Progress Notes (Signed)
Office: [phone]  /  Fax: [phone]   HPI:   Chief Complaint: OBESITY [name] is here to discuss her progress with her obesity treatment plan. She is on the Category 2 plan and is following her eating plan approximately 90 % of the time. She states she is exercising 0 minutes 0 times per week. Cambri is doing well. She is not struggling for the most part. Her weight is 219 lb (99.3 kg) today and has had a weight loss of 6 pounds over a period of 2 weeks since her last visit. She has lost 14 lbs since starting treatment with [place].  Pre-Diabetes [name] has a diagnosis of prediabetes based on her elevated Hgb A1c and was informed this puts her at greater risk of developing diabetes. She is taking metformin currently and continues to work on diet and exercise to decrease risk of diabetes. She denies hypoglycemia.  At risk for diabetes [name] is at higher than average risk for developing diabetes due to her obesity and prediabetes. She currently denies polyuria or polydipsia.  Vitamin D deficiency [name] has a diagnosis of vitamin D deficiency. She is currently taking vit D and denies nausea, vomiting or muscle weakness.  ASSESSMENT AND PLAN:  Prediabetes  Vitamin D deficiency  At risk for diabetes mellitus  Class 2 severe obesity with serious comorbidity and body mass index (BMI) of 36.0 to 36.9 in adult, unspecified obesity type [name])  PLAN:  Pre-Diabetes [name] will continue to work on weight loss, exercise, and decreasing simple carbohydrates in her diet to help decrease the risk of diabetes. We dicussed metformin including benefits and risks. She was informed that eating too many simple carbohydrates or too many calories at one sitting increases the likelihood of GI side effects. [name] agreed to continue metformin 500 mg once daily #30 with no refills and follow up with [place] as directed to monitor her progress.  Diabetes risk counseling [name] was given extended (15 minutes)  diabetes prevention counseling today. She is 38 y.o. female and has risk factors for diabetes including obesity and prediabetes. We discussed intensive lifestyle modifications today with an emphasis on weight loss as well as increasing exercise and decreasing simple carbohydrates in her diet.  Vitamin D Deficiency [name] was informed that low vitamin D levels contributes to fatigue and are associated with obesity, breast, and colon cancer. She agrees to continue to take prescription Vit D @50 ,000 IU every week and will follow up for routine testing of vitamin D, at least 2-3 times per year. She was informed of the risk of over-replacement of vitamin D and agrees to not increase her dose unless she discusses this with [place] first.  Obesity [name] is currently in the action stage of change. As such, her goal is to continue with weight loss efforts She has agreed to follow the Category 2 plan [name] has been instructed to work up to a goal of 150 minutes of combined cardio and strengthening exercise per week for weight loss and overall health benefits. We discussed the following Behavioral Modification Strategies today: increase H2O intake, no skipping meals, keeping healthy foods in the home, increasing lean protein intake, protein sources, decreasing simple carbohydrates, increasing vegetables, decrease eating out and work on meal planning and easy cooking plans Additional lunch options were provided to patient today.  [name] has agreed to follow up with our clinic in 2 weeks. She was informed of the importance of frequent follow up visits to maximize her success with intensive lifestyle modifications  for her multiple health conditions.  ALLERGIES: Allergies  Allergen Reactions  . Other     MEDICATIONS: Current Outpatient Medications on File Prior to Visit  Medication Sig Dispense Refill  . doxycycline (VIBRA-TABS) 100 MG tablet Take 1 tablet (100 mg total) by mouth 2 (two) times daily. (Patient  not taking: Reported on 03/21/2018) 20 tablet 0  . ergocalciferol (DRISDOL) 1.25 MG (50000 UT) capsule Take 1 capsule (50,000 Units total) by mouth once a week. 12 capsule 0  . ketoconazole (NIZORAL) 2 % shampoo Apply 1 application topically 2 (two) times a week.    . LO LOESTRIN FE 1 MG-10 MCG / 10 MCG tablet Take 28 mg by mouth daily.  11  . metFORMIN (GLUCOPHAGE) 500 MG tablet Take 1 tablet (500 mg total) by mouth daily with breakfast. 30 tablet 0  . naproxen sodium (ANAPROX) 550 MG tablet Take 550 mg by mouth as needed.     No current facility-administered medications on file prior to visit.     PAST MEDICAL HISTORY: Past Medical History:  Diagnosis Date  . Class 2 obesity due to excess calories with body mass index (BMI) of 35.0 to 35.9 in adult 02/08/2018  . GERD (gastroesophageal reflux disease)    not currently  . Gestational diabetes mellitus (GDM), antepartum   . Heart murmur   . HSV infection   . Leaky heart valve   . Prediabetes   . Vitamin D deficiency     PAST SURGICAL HISTORY: Past Surgical History:  Procedure Laterality Date  . CESAREAN SECTION    . CESAREAN SECTION N/A 04/21/2015   Procedure: CESAREAN SECTION;  Surgeon: Waymon Amato, MD;  Location: [facility];  Service: Obstetrics;  Laterality: N/A;  . DILATION AND EVACUATION N/A 08/09/2013   Procedure: DILATATION AND EVACUATION;  Surgeon: Betsy Coder, MD;  Location: [facility];  Service: Gynecology;  Laterality: N/A;  . excision of pilonidal cyst    . EXCISION OF SKIN TAG Right 08/09/2013   Procedure: EXCISION OF SKIN TAG on right buttock;  Surgeon: Betsy Coder, MD;  Location: [facility];  Service: Gynecology;  Laterality: Right;  This procedure was added on after the patient had gone to sleep (as per Dr. Charlesetta Garibaldi)  . OPERATIVE ULTRASOUND N/A 08/09/2013   Procedure: OPERATIVE ULTRASOUND;  Surgeon: Betsy Coder, MD;  Location: [facility];  Service: Gynecology;  Laterality: N/A;  . TONSILLECTOMY      SOCIAL HISTORY: Social  History   Tobacco Use  . Smoking status: Never Smoker  . Smokeless tobacco: Never Used  Substance Use Topics  . Alcohol use: Yes    Comment: rarely  . Drug use: No    FAMILY HISTORY: Family History  Problem Relation Age of Onset  . Hypertension Mother   . Sleep apnea Mother   . Obesity Mother   . Diabetes Father   . High Cholesterol Father   . Cancer Father   . Hypertension Brother   . Diabetes Brother     ROS: Review of Systems  Constitutional: Positive for weight loss.  Gastrointestinal: Negative for nausea and vomiting.  Genitourinary: Negative for frequency.  Musculoskeletal:       Negative for muscle weakness  Endo/Heme/Allergies: Negative for polydipsia.       Negative for hypoglycemia    PHYSICAL EXAM: Blood pressure 105/61, pulse 87, temperature 98.1 F (36.7 C), temperature source Oral, height 5\' 5"  (1.651 m), weight 219 lb (99.3 kg), SpO2 98 %, unknown if currently breastfeeding. Body  mass index is 36.44 kg/m. Physical Exam Vitals signs reviewed.  Constitutional:      Appearance: Normal appearance. She is well-developed. She is obese.  Cardiovascular:     Rate and Rhythm: Normal rate.  Pulmonary:     Effort: Pulmonary effort is normal.  Musculoskeletal: Normal range of motion.  Skin:    General: Skin is warm and dry.  Neurological:     Mental Status: She is alert and oriented to person, place, and time.  Psychiatric:        Mood and Affect: Mood normal.        Behavior: Behavior normal.     RECENT LABS AND TESTS: BMET    Component Value Date/Time   NA 137 02/06/2018 1201   K 4.6 02/06/2018 1201   CL 102 02/06/2018 1201   CO2 21 02/06/2018 1201   GLUCOSE 99 02/06/2018 1201   GLUCOSE 123 (H) 09/27/2017 0913   BUN 11 02/06/2018 1201   CREATININE 0.61 02/06/2018 1201   CREATININE 0.82 09/27/2017 0913   CALCIUM 9.7 02/06/2018 1201   GFRNONAA 115 02/06/2018 1201   GFRNONAA 91 09/27/2017 0913   GFRAA 133 02/06/2018 1201   GFRAA 106  09/27/2017 0913   Lab Results  Component Value Date   HGBA1C 5.8 (H) 12/27/2017   HGBA1C 6.0 (H) 09/27/2017   HGBA1C 5.9 (H) 09/30/2011   Lab Results  Component Value Date   INSULIN 26.2 (H) 02/06/2018   CBC    Component Value Date/Time   WBC 7.1 02/06/2018 1201   WBC 7.0 09/27/2017 0913   RBC 4.36 02/06/2018 1201   RBC 4.19 09/27/2017 0913   HGB 12.0 02/06/2018 1201   HCT 37.2 02/06/2018 1201   PLT 367 09/27/2017 0913   MCV 85 02/06/2018 1201   MCH 27.5 02/06/2018 1201   MCH 27.7 09/27/2017 0913   MCHC 32.3 02/06/2018 1201   MCHC 33.0 09/27/2017 0913   RDW 12.7 02/06/2018 1201   LYMPHSABS 2.1 02/06/2018 1201   MONOABS 0.7 02/26/2014 1246   EOSABS 0.2 02/06/2018 1201   BASOSABS 0.0 02/06/2018 1201   Iron/TIBC/Ferritin/ %Sat    Component Value Date/Time   IRON 54 09/30/2017 1607   TIBC 373 09/30/2017 1607   FERRITIN 81 09/30/2017 1607   IRONPCTSAT 14 (L) 09/30/2017 1607   Lipid Panel     Component Value Date/Time   CHOL 149 09/27/2017 0913   TRIG 81 09/27/2017 0913   HDL 44 (L) 09/27/2017 0913   CHOLHDL 3.4 09/27/2017 0913   LDLCALC 88 09/27/2017 0913   Hepatic Function Panel     Component Value Date/Time   PROT 7.2 02/06/2018 1201   ALBUMIN 4.3 02/06/2018 1201   AST 11 02/06/2018 1201   ALT 9 02/06/2018 1201   ALKPHOS 52 02/06/2018 1201   BILITOT 0.3 02/06/2018 1201      Component Value Date/Time   TSH 1.400 02/06/2018 1201   TSH 1.61 09/27/2017 0913   TSH 1.611 09/30/2011 1300     Ref. Range 02/06/2018 12:01  Vitamin D, 25-Hydroxy Latest Ref Range: 30.0 - 100.0 ng/mL 26.0 (L)     OBESITY BEHAVIORAL INTERVENTION VISIT  Today's visit was # 4   Starting weight: 233 lbs Starting date: 02/06/2018 Today's weight : 219 lbs Today's date: 03/23/2018 Total lbs lost to date: 14    03/23/2018  Height 5\' 5"  (1.651 m)  Weight 219 lb (99.3 kg)  BMI (Calculated) 36.44  BLOOD PRESSURE - SYSTOLIC 456  BLOOD PRESSURE - DIASTOLIC 61  Body Fat % 43.1 %    Total Body Water (lbs) 87 lbs     ASK: We discussed the diagnosis of obesity with [name] today and [name] agreed to give [place] permission to discuss obesity behavioral modification therapy today.  ASSESS: [name] has the diagnosis of obesity and her BMI today is 36.44 [name] is in the action stage of change   ADVISE: [name] was educated on the multiple health risks of obesity as well as the benefit of weight loss to improve her health. She was advised of the need for long term treatment and the importance of lifestyle modifications to improve her current health and to decrease her risk of future health problems.  AGREE: Multiple dietary modification options and treatment options were discussed and  [name] agreed to follow the recommendations documented in the above note.  ARRANGE: [name] was educated on the importance of frequent visits to treat obesity as outlined per CMS and USPSTF guidelines and agreed to schedule her next follow up appointment today.  Corey Skains, am acting as Location manager for [employer]. Owens Shark, DO  I have reviewed the above documentation for accuracy and completeness, and I agree with the above. -Jearld Lesch, DO

## 2018-04-03 NOTE — Progress Notes (Signed)
Office: [phone]  /  Fax: [phone]    Date: 04/05/2018 Time Seen: 12:00pm Duration: 32 minutes Provider: Glennie Isle, Psy.D. Type of Session: Individual Therapy  Type of Contact: Face-to-face  Session Content: [name] is a 38 y.o. female presenting for a follow-up appointment to address the previously established treatment goal of decreasing emotional eating. The session was initiated with the administration of the PHQ-9 and GAD-7, as well as a brief check-in. Emillia discussed ongoing worry due to the coronavirus. Thus, associated thoughts and feelings were processed. Due to uncertainty related to the coronavirus, this provider and [name] discussed referral options for providers offering teletherapy as well as the utilization of emergency resources if this provider's clinic was to close. A handout was provided. Almina expressed understanding, and noted she would use the provided resources if necessary. However, she added, "I feel like I could hold off if you all shut down."    Regarding eating, Corrisa stated, "Today, I haven't eaten anything just because I didn't have time." She added, "It's been like that since last week." Additionally, Saisha shared stress has been the biggest trigger for emotional eating. As such, psychoeducation regarding mindfulness was provided. A handout was provided to [facility] with further information regarding mindfulness, including exercises. This provider also explained the benefit of mindfulness as it relates to emotional eating. Carmalita was encouraged to engage in the provided exercises between now and the next appointment with this provider. [name] agreed. She was led through an exercise involving her senses. Following the exercise, she noted, "I feel more relaxed."  Austyn was receptive to today's session as evidenced by openness to sharing, responsiveness to feedback, and engagement in a mindfulness exercise.  Mental Status Examination: [name] arrived on time  for the appointment. She presented as appropriately dressed and groomed. Kinslea appeared her stated age and demonstrated adequate orientation to time, place, person, and purpose of the appointment. She also demonstrated appropriate eye contact. No psychomotor abnormalities or behavioral peculiarities noted. Her mood was euthymic with congruent affect. Her thought processes were logical, linear, and goal-directed. No hallucinations, delusions, bizarre thinking or behavior reported or observed. Judgment, insight, and impulse control appeared to be grossly intact. There was no evidence of paraphasias (i.e., errors in speech, gross mispronunciations, and word substitutions), repetition deficits, or disturbances in volume or prosody (i.e., rhythm and intonation). There was no evidence of attention or memory impairments. [name] denied current suicidal and homicidal ideation, plan and intent.   Structured Assessment Results: The Patient Health Questionnaire-9 (PHQ-9) is a self-report measure that assesses symptoms and severity of depression over the course of the last two weeks. Fatimah obtained a score of zero. Depression screen [facility] 2/9 04/05/2018  Decreased Interest 0  Down, Depressed, Hopeless 0  PHQ - 2 Score 0  Altered sleeping 0  Tired, decreased energy 0  Change in appetite 0  Feeling bad or failure about yourself  0  Trouble concentrating 0  Moving slowly or fidgety/restless 0  Suicidal thoughts 0  PHQ-9 Score 0  Difficult doing work/chores -   The Generalized Anxiety Disorder-7 (GAD-7) is a brief self-report measure that assesses symptoms of anxiety over the course of the last two weeks. Maryl obtained a score of 3 suggesting minimal anxiety. GAD 7 : Generalized Anxiety Score 04/05/2018  Nervous, Anxious, on Edge 1  Control/stop worrying 1  Worry too much - different things 1  Trouble relaxing 0  Restless 0  Easily annoyed or irritable 0  Afraid - awful might happen 0  Total GAD 7 Score 3   Anxiety Difficulty Not difficult at all   Interventions:  Administration of PHQ-9 and GAD-7 for symptom monitoring Review of content from the previous session Empathic reflections and validation Psychoeducation regarding mindfulness Mindfulness exercise Positive reinforcement Brief chart review  DSM-5 Diagnosis: 311 (F32.8) Other Specified Depressive Disorder, Emotional Eating Behaviors  Treatment Goal & Progress: During the initial appointment with this provider, the following treatment goal was established: decrease emotional eating. [name] has demonstrated progress in her goal as evidenced by increased awareness of hunger patterns and triggers for emotional eating. She also demonstrated willingness to practice mindfulness.   Plan: Deondria continues to appear able and willing to participate as evidenced by engagement in reciprocal conversation, and asking questions for clarification as appropriate. The next appointment will be scheduled in two weeks. The next session will focus further on mindfulness.

## 2018-04-05 ENCOUNTER — Ambulatory Visit (INDEPENDENT_AMBULATORY_CARE_PROVIDER_SITE_OTHER): Payer: BC Managed Care – PPO | Admitting: Psychology

## 2018-04-05 ENCOUNTER — Other Ambulatory Visit: Payer: Self-pay

## 2018-04-05 DIAGNOSIS — F3289 Other specified depressive episodes: Secondary | ICD-10-CM

## 2018-04-10 ENCOUNTER — Encounter (INDEPENDENT_AMBULATORY_CARE_PROVIDER_SITE_OTHER): Payer: Self-pay

## 2018-04-11 ENCOUNTER — Encounter (INDEPENDENT_AMBULATORY_CARE_PROVIDER_SITE_OTHER): Payer: Self-pay

## 2018-04-11 ENCOUNTER — Ambulatory Visit (INDEPENDENT_AMBULATORY_CARE_PROVIDER_SITE_OTHER): Payer: Self-pay | Admitting: Bariatrics

## 2018-04-19 NOTE — Progress Notes (Signed)
Office: [phone]  /  Fax: [phone]    Date: April 20, 2018  Appointment Start Time: 2:01pm Duration: 28 minutes Provider: Glennie Isle, Psy.D. Type of Session: Individual Therapy  Location of Patient: Home-bedroom Location of Provider: Office  Type of Contact: Telepsychological Visit via [employer]   Session Content:Prior to initiating telepsychological services, [name] was provided with an informed consent document via e-mail, which included the development of a safety plan (I.e., an emergency contact and emergency resources) in the event of an emergency/crisis. [name] was unable to complete the form and return it to this provider prior to today's appointment. As such, this provider verbally discussed the consent form during today's appointment. [name] provided an emergency contact, and verbally acknowledged understanding that she is ultimately responsible for understanding her insurance benefits as it relates to reimbursement of telepsychological services. This provider also reviewed confidentiality, as it relates to telepsychological services, as well as the rationale for telepsychological services. More specifically, this provider's clinic is closed for in-person visits due to COVID-19. Therapeutic services will resume to in-person appointments once the clinic re-opens. [name] expressed understanding regarding the rationale for telepsychological services. In addition, this provider explained the telepsychological services informed consent document would be considered an addendum to the initial consent document. [name] verbally consented to proceed. She also noted a plan to send a statement via a MyChart message providing written consent for telepsychological services. Regarding MyChart messages, [name] verbally acknowledged understanding that any messages sent would be a part of her electronic medical record; therefore, visible to all providers.   Notably, [name] was initially scheduled  for an appointment today at 12:00pm. When she did not present for her Webex appointment, this provider called her at 12:02pm. She reported, "I thought the appointment was yesterday." She agreed to being rescheduled to 2:00pm.   [name] is a 38 y.o. female presenting via [employer] for a follow-up appointment to address the previously established treatment goal of decreasing emotional eating. Today's appointment was a telepsychological visit, as this provider's clinic is closed for in-person visits due to COVID-19. Prior to proceeding with today's appointment, [name]'s physical location at the time of this appointment was obtained. [name] reported she was at home in her bedroom and provided the address. In the event of technical difficulties, [name] shared a phone number she could be reached at. [name] and this provider participated in today's telepsychological service. Also, [name] denied anyone else being present in the room or on the [employer]. After discussing the informed consent, this provider conducted a brief check-in. [name] reported it has been "rough" with her daughter being home; however, [name] shared her routine is getting better. She expressed stress regarding COVID-19; therefore, this provider normalized and validated concerns. [name] shared a plan to continue praying to help cope and was receptive to this provider recommending she follow reliable and trusted sources of information, such as the CDC. Regarding eating, [name] noted "I eat what I'm supposed to eat," but discussed daily episodes of emotional eating secondary to boredom and stress. Thus, psychoeducation regarding pleasurable activities, including its impact on emotional eating was provided. [name] was provided sent a handout with various options of pleasurable activities, and was encouraged to engage in one activity a day and additional activities when triggered to emotionally eat. [name] agreed. Regarding mindfulness, [name]  acknowledged, "I haven't focused on me." Thus, this provider discussed incorporating the mindfulness exercise involving the senses when engaging in pleasurable activities. [name] agreed. This provider sent [name] a MyChart message with  a handout for pleasurable activities as well as for mindfulness per [name] request. Prior to sending the message, this provider explained the message would be visible to all providers, as it would be part of the electronic medical record. [name] verbally acknowledged understanding, and verbally consented to this provider sending the MyChart message. Moreover, this provider verbally administered the PHQ-9 and GAD-7. Furthermore, this provider discussed termination planning, including the option for a referral for longer-term therapeutic services. [name] was receptive to today's session as evidenced by openness to sharing, responsiveness to feedback, and willingness to engage in pleasurable activities and mindfulness. .  Mental Status Examination:  Appearance: neat Behavior: cooperative Mood: euthymic Affect: mood congruent Speech: normal in rate, volume, and tone Eye Contact: appropriate Psychomotor Activity: appropriate Thought Process: linear, logical, and goal directed  Content/Perceptual Disturbances: denies suicidal and homicidal ideation, plan, and intent and no hallucinations, delusions, bizarre thinking or behavior reported or observed Orientation: time, person, place and purpose of appointment Cognition/Sensorium: memory, attention, language, and fund of knowledge intact  Insight: good Judgment: fair  Structured Assessment Results: The Patient Health Questionnaire-9 (PHQ-9) is a self-report measure that assesses symptoms and severity of depression over the course of the last two weeks. [name] obtained a score of zero. Depression screen [facility] 2/9 04/20/2018  Decreased Interest 0  Down, Depressed, Hopeless 0  PHQ - 2 Score 0  Altered sleeping 0  Tired,  decreased energy 0  Change in appetite 0  Feeling bad or failure about yourself  0  Trouble concentrating 0  Moving slowly or fidgety/restless 0  Suicidal thoughts 0  PHQ-9 Score 0  Difficult doing work/chores -   The Generalized Anxiety Disorder-7 (GAD-7) is a brief self-report measure that assesses symptoms of anxiety over the course of the last two weeks. [name] obtained a score of 1 suggesting minimal anxiety. GAD 7 : Generalized Anxiety Score 04/20/2018  Nervous, Anxious, on Edge 0  Control/stop worrying 0  Worry too much - different things 0  Trouble relaxing 0  Restless 0  Easily annoyed or irritable 1  Afraid - awful might happen 0  Total GAD 7 Score 1  Anxiety Difficulty Not difficult at all   Interventions:  Administration of PHQ-9 and GAD-7 for symptom monitoring Review of content from the previous session Empathic reflections and validation Psychoeducation regarding pleasurable activities Termination planning Positive reinforcement Brief chart review Employed supportive psychotherapy interventions today to facilitate reduced distress, and to improve coping skills with identified stressors  DSM-5 Diagnosis: 311 (F32.8) Other Specified Depressive Disorder, Emotional Eating Behaviors  Treatment Goal & Progress: During the initial appointment with this provider, the following treatment goal was established: decrease emotional eating. [name] has demonstrated progress in her goal as evidenced by increased awareness of hunger patterns and triggers for emotional eating. Despite episodes of emotional eating, [name] demonstrates willingness to continue to engage in learned skills.   Plan: [name] continues to appear able and willing to participate as evidenced by engagement in reciprocal conversation, and asking questions for clarification as appropriate. The next appointment will be scheduled in two weeks, which will be via [employer]. Once this provider's office resumes in-person  appointments, [name] will be notified. The next session will focus on reviewing pleasurable activities, and further on mindfulness.

## 2018-04-20 ENCOUNTER — Encounter (INDEPENDENT_AMBULATORY_CARE_PROVIDER_SITE_OTHER): Payer: Self-pay

## 2018-04-20 ENCOUNTER — Other Ambulatory Visit: Payer: Self-pay

## 2018-04-20 ENCOUNTER — Ambulatory Visit (INDEPENDENT_AMBULATORY_CARE_PROVIDER_SITE_OTHER): Payer: BC Managed Care – PPO | Admitting: Psychology

## 2018-04-20 DIAGNOSIS — F3289 Other specified depressive episodes: Secondary | ICD-10-CM | POA: Diagnosis not present

## 2018-04-25 ENCOUNTER — Telehealth (INDEPENDENT_AMBULATORY_CARE_PROVIDER_SITE_OTHER): Payer: Self-pay | Admitting: Psychology

## 2018-04-25 NOTE — Telephone Encounter (Signed)
  Office: [phone]  /  Fax: [phone]  Date of Call: April 25, 2018 Time of Call: 2:17pm Duration of Call: 2 minutes Provider: Glennie Isle, PsyD  CONTENT: This provider called to check-in and schedule a follow-up appointment. Amari reported she is doing "good," but acknowledged feeling "out of control" with eating over the weekend. She added she is now back on track. A brief risk assessment was completed. Wilmer denied current suicidal and homicidal ideation, plan, and intent as well as since the last appointment with this provider.  PLAN: Arfa will be scheduled for a two week follow-up appointment on May 04, 2018 at 10:00am. An e-mail with a secure WebEx link will be sent for the appointment.

## 2018-05-02 NOTE — Progress Notes (Signed)
Office: [phone]  /  Fax: [phone]    Date: May 04, 2018   Appointment Start Time: 10:03am Duration: 25 minutes Provider: Glennie Isle, Psy.D. Type of Session: Individual Therapy  Location of Patient: Home- bedroom Location of Provider: Home  Type of Contact: Telepsychological Visit via [employer]   Session Content: [name] is a 38 y.o. female presenting via [place] [employer] for a follow-up appointment to address the previously established treatment goal of decreasing emotional eating. Today's appointment was a telepsychological visit, as this provider's clinic is closed for in-person visits due to COVID-19. Therapeutic services will resume to in-person appointments once the clinic re-opens. Puanani expressed understanding regarding the rationale for telepsychological services. Prior to proceeding with today's appointment, Nicollette's physical location at the time of this appointment was obtained. Rindy reported she was at home in her bedroom and provided the address. In the event of technical difficulties, Elantra shared a phone number she could be reached at. Azuri and this provider participated in today's telepsychological service. Also, Glen denied anyone else being present in the room or on the [employer].   This provider verbally administered the PHQ-9 and GAD-7, and conducted a brief check-in. Harlo shared, "We haven't been going out as much." She noted, "Work has been busy." Regarding eating, Jamieka shared, "It's been going good, but I had a cheat day on Easter." This was explored. Despite the "cheat" meal, Roselin acknowledged eating congruent to the structured meal plan for other meals on Easter. Additionally, she denied episodes of emotional eating since the last appointment with this provider, and she believes it is because she has established a new routine. Regarding pleasurable activities, Jamilla noted she has been walking, and spending time with her children. She also  attempted to engage in mindfulness when she is walking. Notably, she shared, "I tried to try mindfulness when I was upset with my daughter, and it didn't work." This was explored. Tashina discussed her daughter was not understanding what she was trying to say, and she did not want to be the "nagging parent." Thus, she reported she went to her room, and tried to engage in the exercise involving her senses. Notably, Shantell acknowledged she was able to step away and she tried to "calm down." It was reflected the mindfulness exercise assisted with avoiding escalation and allowed her to call her husband for assistance, and subsequently re-visit the issue with her daughter. Ayn agreed, and she also acknowledged it assisted her in avoiding eating in response to the conflict. Moreover, this provider discussed the utilization of YouTube for mindfulness exercises, specifically videos by Merri Ray. Psychoeducation regarding the fight or flight response was also provided as it relates to the conflict with her daughter. This provider recommended Grace try the mindfulness exercise focusing on her breathing specifically after conflicts to help with relaxation, as our body cannot be both stressed and relaxed at the same time. [name] agreed. Of note, [name] indicated she is experiencing knee pain and her knee is making "noise" when she goes down the stairs, which has contributed to a decrease in exercising. It was recommended she contact her PCP; [name] agreed. Furthermore, this provider discussed termination planning. Due to the current pandemic, this provider will continue meeting with Preet as needed and if deemed appropriate. It was recommended she check-in with her insurance regarding benefits and coverage for additional appointments. She agreed. Malyn was receptive to today's session as evidenced by openness to sharing, responsiveness to feedback, and willingness to continue engaging in mindfulness  exercises.  Mental Status Examination:  Appearance: neat Behavior: cooperative Mood: euthymic Affect: mood congruent Speech: normal in rate, volume, and tone Eye Contact: appropriate Psychomotor Activity: appropriate Thought Process: linear, logical, and goal directed  Content/Perceptual Disturbances: denies suicidal and homicidal ideation, plan, and intent and no hallucinations, delusions, bizarre thinking or behavior reported or observed Orientation: time, person, place and purpose of appointment Cognition/Sensorium: memory, attention, language, and fund of knowledge intact  Insight: good Judgment: good  Structured Assessment Results: The Patient Health Questionnaire-9 (PHQ-9) is a self-report measure that assesses symptoms and severity of depression over the course of the last two weeks. Kade obtained a score of 0. Depression screen [facility] 2/9 05/04/2018  Decreased Interest 0  Down, Depressed, Hopeless 0  PHQ - 2 Score 0  Altered sleeping 0  Tired, decreased energy 0  Change in appetite 0  Feeling bad or failure about yourself  0  Trouble concentrating 0  Moving slowly or fidgety/restless 0  Suicidal thoughts 0  PHQ-9 Score 0  Difficult doing work/chores -   The Generalized Anxiety Disorder-7 (GAD-7) is a brief self-report measure that assesses symptoms of anxiety over the course of the last two weeks. Donnabelle obtained a score of 0. GAD 7 : Generalized Anxiety Score 05/04/2018  Nervous, Anxious, on Edge 0  Control/stop worrying 0  Worry too much - different things 0  Trouble relaxing 0  Restless 0  Easily annoyed or irritable 0  Afraid - awful might happen 0  Total GAD 7 Score 0  Anxiety Difficulty -   Interventions:  Administration of PHQ-9 and GAD-7 for symptom monitoring Review of content from the previous session Empathic reflections and validation Provided further psychoeducation regarding mindfulness Provided psychoeducation regarding the fight or flight response   Positive reinforcement Brief chart review Employed supportive psychotherapy interventions today to facilitate reduced distress, and to improve coping skills with identified stressors Employed insight oriented and cognitive psychotherapy interventions to identify and modify anxiety / mood producing thoughts, beliefs, and subsequent behaviors contributing to distress  DSM-5 Diagnosis: 311 (F32.8) Other Specified Depressive Disorder, Emotional Eating Behaviors  Treatment Goal & Progress: During the initial appointment with this provider, the following treatment goal was established: decrease emotional eating. Loyd has demonstrated progress in her goal as evidenced by increased awareness of hunger patterns and triggers for emotional eating. She also continues to demonstrate willingness to engage in learned skills to assist with coping.   Plan: Jacklynn continues to appear able and willing to participate as evidenced by engagement in reciprocal conversation, and asking questions for clarification as appropriate. The next appointment will be scheduled in two weeks, which will be via [employer]. Once this provider's office resumes in-person appointments, Harue will be notified. The next session will focus further on mindfulness.

## 2018-05-04 ENCOUNTER — Ambulatory Visit (INDEPENDENT_AMBULATORY_CARE_PROVIDER_SITE_OTHER): Payer: BC Managed Care – PPO | Admitting: Psychology

## 2018-05-04 ENCOUNTER — Other Ambulatory Visit: Payer: Self-pay

## 2018-05-04 DIAGNOSIS — F3289 Other specified depressive episodes: Secondary | ICD-10-CM | POA: Diagnosis not present

## 2018-05-18 ENCOUNTER — Other Ambulatory Visit: Payer: Self-pay

## 2018-05-18 ENCOUNTER — Ambulatory Visit (INDEPENDENT_AMBULATORY_CARE_PROVIDER_SITE_OTHER): Payer: BC Managed Care – PPO | Admitting: Psychology

## 2018-05-18 DIAGNOSIS — F3289 Other specified depressive episodes: Secondary | ICD-10-CM

## 2018-05-18 NOTE — Progress Notes (Signed)
Office: [phone]  /  Fax: [phone]    Date: May 18, 2018   Appointment Start Time: 12:04pm Duration: 30 minutes Provider: Glennie Isle, Psy.D. Type of Session: Individual Therapy  Location of Patient: Home-bedroom Location of Provider: [facility] Type of Contact: Telepsychological Visit via [employer] Web[employer]   Session Content: [name] is a 38 y.o. female presenting via [place] for a follow-up appointment to address the previously established treatment goal of decreasing emotional eating. Today's appointment was a telepsychological visit, as this provider's clinic is closed for in-person visits due to COVID-19. Therapeutic services will resume to in-person appointments once the clinic re-opens. Evelia expressed understanding regarding the rationale for telepsychological services, and provided verbal consent for today's appointment. Prior to proceeding with today's appointment, Kaysi's physical location at the time of this appointment was obtained. Damari reported she was at home in her bedroom and provided the address. In the event of technical difficulties, Florenda shared a phone number she could be reached at. Raini and this provider participated in today's telepsychological service. Also, Geneal denied anyone else being present in the room or on the WebEx appointment.  Notably, this provider called Josefine at 10:02am as she did not present for her 10:00am WebEx appointment. She noted, "Our appointment! I forgot." She attempted to join; however, there were audio issues in that she could not hear this provider. The provider recommended calling-in; however, that caused an echo. As such, she was rescheduled to 12:00pm. This provider called Citlally at 12:02pm, as the previous WebEx link no longer worked and a new link was sent. Thus, the appointment was initiated 4 minutes late.   This provider conducted a brief check-in and verbally administered the PHQ-9 and GAD-7.  Donica shared, "I'm tired. Today in itself has been very, very busy." She discussed having a late breakfast as a result. Nevertheless, Luretha stated she is "trying better" to eat congruent to the meal plan. She acknowledged it is difficult to not eat her children's leftovers. She further noted she has not "been able to focus on the mindfulness." During today's appointment, Winnifred was led through a mindfulness exercise focusing on her breathing. After the exercise she noted, "It was relaxing. Being able to focus on my breath, it was helpful." She reported while her mind wandered, she was able to bring her awareness back to her breath. To further assist with her mindfulness practice, this provider discussed the utilization of YouTube for mindfulness exercises, specifically videos by Merri Ray. Moreover, to help with the out of eating habit of eating her children's leftovers, this provider recommended Donelda engage in the short mindfulness exercise involving her senses; she agreed. With Bonnetta's verbal consent, the script for the mindfulness exercise and the handouts previously shared on mindfulness and pleasurable activities were sent via e-mail. Overall, Fonda was receptive to today's session as evidenced by openness to sharing, responsiveness to feedback, and willingness to engage in learned skills.  Mental Status Examination:  Appearance: neat Behavior: cooperative Mood: euthymic Affect: mood congruent Speech: normal in rate, volume, and tone Eye Contact: appropriate Psychomotor Activity: appropriate Thought Process: linear, logical, and goal directed  Content/Perceptual Disturbances: denies suicidal and homicidal ideation, plan, and intent and no hallucinations, delusions, bizarre thinking or behavior reported or observed Orientation: time, person, place and purpose of appointment Cognition/Sensorium: memory, attention, language, and fund of knowledge intact  Insight: good Judgment: good   Structured Assessment Results: The Patient Health Questionnaire-9 (PHQ-9) is a self-report measure that assesses symptoms and  severity of depression over the course of the last two weeks. Sherril obtained a score of 2 suggesting minimal depression. Koraima finds the endorsed symptoms to be not difficult at all. Depression screen [facility] 2/9 05/18/2018  Decreased Interest 0  Down, Depressed, Hopeless 0  PHQ - 2 Score 0  Altered sleeping 0  Tired, decreased energy 1  Change in appetite 1  Feeling bad or failure about yourself  0  Trouble concentrating 0  Moving slowly or fidgety/restless 0  Suicidal thoughts 0  PHQ-9 Score 2  Difficult doing work/chores -   The Generalized Anxiety Disorder-7 (GAD-7) is a brief self-report measure that assesses symptoms of anxiety over the course of the last two weeks. Merri obtained a score of 6 suggesting mild anxiety. GAD 7 : Generalized Anxiety Score 05/18/2018  Nervous, Anxious, on Edge 2  Control/stop worrying 0  Worry too much - different things 2  Trouble relaxing 1  Restless 0  Easily annoyed or irritable 1  Afraid - awful might happen 0  Total GAD 7 Score 6  Anxiety Difficulty Not difficult at all   Interventions:  Administered PHQ-9 and GAD-7 for symptom monitoring Reviewed content from the previous session Provided empathic reflections and validation Engaged patient in a mindfulness exercise Provided positive reinforcement Employed acceptance and commitment interventions to emphasize mindfulness and acceptance without struggle Conducted a brief chart review  DSM-5 Diagnosis: 311 (F32.8) Other Specified Depressive Disorder, Emotional Eating Behaviors  Treatment Goal & Progress: During the initial appointment with this provider, the following treatment goal was established: decrease emotional eating. Jamese has demonstrated progress in her goal as evidenced by increased awareness of hunger patterns and recent triggers for emotional eating  (e.g., out of habit). She also demonstrates willingness to engage in learned skills to assist with coping.   Plan: Quilla continues to appear able and willing to participate as evidenced by engagement in reciprocal conversation, and asking questions for clarification as appropriate. The next appointment will be scheduled in two weeks, which will be via [employer]. Once this provider's office resumes in-person appointments, Hazel will be notified. The next session will focus further on mindfulness.

## 2018-05-24 NOTE — Progress Notes (Unsigned)
  Office: [phone]  /  Fax: [phone]    Date: May 29, 2018   Appointment Start Time:*** Duration:*** Provider: Glennie Isle, Psy.D. Type of Session: Individual Therapy  Location of Patient: *** Location of Provider: {Location of Service:[address]} Type of Contact: Telepsychological Visit via [employer] WebEx   Session Content: [name] is a 38 y.o. female presenting via [place] for a follow-up appointment to address the previously established treatment goal of decreasing emotional eating. Today's appointment was a telepsychological visit, as this provider's clinic is closed for in-person visits due to COVID-19. Therapeutic services will resume to in-person appointments once the clinic re-opens. [name] expressed understanding regarding the rationale for telepsychological services, and provided verbal consent for today's appointment. Prior to proceeding with today's appointment, [name]'s physical location at the time of this appointment was obtained. [name] reported she was at *** and provided the address. In the event of technical difficulties, [name] shared a phone number she could be reached at. [name] and this provider participated in today's telepsychological service. Also, [name] denied anyone else being present in the room or on the WebEx appointment ***.  This provider conducted a brief check-in and verbally administered the PHQ-9 and GAD-7. ***   [name] was receptive to today's session as evidenced by openness to sharing, responsiveness to feedback, and ***.  Mental Status Examination:  Appearance: {Appearance:22431} Behavior: {Behavior:22445} Mood: {Teletherapy mood:22435} Affect: {Affect:22436} Speech: {Speech:22432} Eye Contact: {Eye Contact:22433} Psychomotor Activity: {Motor Activity:22434} Thought Process: {thought process:22448}  Content/Perceptual Disturbances: {disturbances:22451} Orientation: {Orientation:22437} Cognition/Sensorium: {gbcognition:22449} Insight:  {Insight:22446} Judgment: {Insight:22446}  Structured Assessment Results: The Patient Health Questionnaire-9 (PHQ-9) is a self-report measure that assesses symptoms and severity of depression over the course of the last two weeks. [name] obtained a score of *** suggesting {GBPHQ9SEVERITY:21752}. [name] finds the endorsed symptoms to be {gbphq9difficulty:21754}.  The Generalized Anxiety Disorder-7 (GAD-7) is a brief self-report measure that assesses symptoms of anxiety over the course of the last two weeks. [name] obtained a score of *** suggesting {gbgad7severity:21753}.  Interventions:  {Interventions:22172}  DSM-5 Diagnosis: 311 (F32.8) Other Specified Depressive Disorder, Emotional Eating Behaviors  Treatment Goal & Progress: During the initial appointment with this provider, the following treatment goal was established: decrease emotional eating. [name] has demonstrated progress in her goal as evidenced by ***  Plan: [name] continues to appear able and willing to participate as evidenced by engagement in reciprocal conversation, and asking questions for clarification as appropriate. The next appointment will be scheduled in {gbweeks:21758}, which will be via [employer]. Once this provider's office resumes in-person appointments, [name] will be notified. The next session will focus on reviewing learned skills, and working towards the established treatment goal.***

## 2018-05-29 ENCOUNTER — Ambulatory Visit (INDEPENDENT_AMBULATORY_CARE_PROVIDER_SITE_OTHER): Payer: Self-pay | Admitting: Psychology

## 2018-05-30 ENCOUNTER — Ambulatory Visit (INDEPENDENT_AMBULATORY_CARE_PROVIDER_SITE_OTHER): Payer: BC Managed Care – PPO | Admitting: Psychology

## 2018-05-30 ENCOUNTER — Other Ambulatory Visit: Payer: Self-pay

## 2018-05-30 DIAGNOSIS — F3289 Other specified depressive episodes: Secondary | ICD-10-CM

## 2018-05-30 NOTE — Progress Notes (Signed)
Office: [phone]  /  Fax: [phone]    Date: May 30, 2018   Appointment Start Time: 10:12am Duration: 25 minutes Provider: Glennie Isle, Psy.D. Type of Session: Individual Therapy  Location of Patient: Home Location of Provider: Provider's Home Type of Contact: Telepsychological Visit via [employer] WebEx   Session Content: [name] is a 38 y.o. female presenting via [place] for a follow-up appointment to address the previously established treatment goal of decreasing emotional eating. Today's appointment was a telepsychological visit, as this provider's clinic is closed for in-person visits due to COVID-19. Therapeutic services will resume to in-person appointments once the clinic re-opens. [name] expressed understanding regarding the rationale for telepsychological services, and provided verbal consent for today's appointment. Prior to proceeding with today's appointment, [name]'s physical location at the time of this appointment was obtained. [name] reported she was at home and provided the address. In the event of technical difficulties, [name] shared a phone number she could be reached at. [name] and this provider participated in today's telepsychological service. Also, [name] denied anyone else being present in the room or on the WebEx appointment. However, her 46-year-old son occasionally came to her bedroom to speak with her. Of note, this provider called [name] at 10;06 as she had not presented for her WebEx appointment. She reported she was unable to locate the e-mail sent with the secure link for today's appointment. Thus, it was re-sent. She also reported continued technical difficulties; therefore, this provider assisted with troubleshooting. As such, the appointment was initiated 12 minutes late.   This provider conducted a brief check-in and verbally administered the PHQ-9 and GAD-7. [name] shared, "It's been busy. I haven't been sticking to my eating plan."  It was recommended an  appointment be scheduled in one week due to reported difficulties with eating congruent to the structured meal plan; [name] agreed. She further reported, "I don't know if it is self-sabotage." This was explored. [name] shared a belief of weight gain and noted, "It bothers me." [name] also reported, "I have no self-control." As such, she was engaged in thought challenging by exploring the evidence for and against the aforementioned thought. With the information obtained, [name] was able to develop a more balanced thought, which she reported felt less negative. Moreover, [name] discussed enjoying the [employer] by [name] for mindfulness. Thus, she was encouraged to continue engaging in mindfulness daily and to engage in thought challenging as needed. Due to [name] also describing engagement in all or nothing thinking, this was further discussed and a goal of increasing eating congruent to the structured meal plan between now and the next appointment by 10% was established. [name] indicated there were no barriers/obstacles that she could think of that could get in the way of her reaching the established goal. Overall, [name] was receptive to today's session as evidenced by openness to sharing, responsiveness to feedback, and willingness to engage in discussed strategies.   Mental Status Examination:  Appearance: neat Behavior: cooperative Mood: euthymic Affect: mood congruent Speech: normal in rate, volume, and tone Eye Contact: appropriate Psychomotor Activity: appropriate Thought Process: linear, logical, and goal directed  Content/Perceptual Disturbances: denies suicidal and homicidal ideation, plan, and intent and no hallucinations, delusions, bizarre thinking or behavior reported or observed Orientation: time, person, place and purpose of appointment Cognition/Sensorium: memory, attention, language, and fund of knowledge intact  Insight: good Judgment: good  Structured Assessment  Results: The Patient Health Questionnaire-9 (PHQ-9) is a self-report measure that assesses symptoms and severity of depression over  the course of the last two weeks. [name] obtained a score of 1 suggesting minimal depression. [name] the endorsed symptoms to be not difficult at all. Courtney interest 0  Meyers, depressed, hopeless 0  Altered sleeping 1  Tired, Courtney energy 0  Change in appetite 0  Feeling bad or failure about yourself 0  Trouble concentrating 0  Moving slowly or fidgety/restless 0  Suicidal thoughts 0  PHQ-9 Score 1    The Generalized Anxiety Disorder-7 (GAD-7) is a brief self-report measure that assesses symptoms of anxiety over the course of the last two weeks. [name] obtained a score of 1 suggesting minimal anxiety. [name] Meyers the endorsed symptoms to be not difficult at all. Nervous, anxious, on edge 1  Control/stop worrying 0  Worrying too much- different things 0  Trouble relaxing 0  Restless 0  Easily annoyed or irritable 0  Afraid-awful might happen 0  GAD-7 Score 1   Interventions:  Verbal administration of PHQ-9 and GAD-7 for symptom monitoring Reviewed content from the previous session Provided empathic reflections and validation Engaged patient in goal setting Engaged patient in thought challenging/cognitive restructuring  Employed supportive psychotherapy interventions to facilitate reduced distress, and to improve coping skills with identified stressors Conducted a brief chart review  DSM-5 Diagnosis: 311 (F32.8) Other Specified Depressive Disorder, Emotional Eating Behaviors  Treatment Goal & Progress: During the initial appointment with this provider, the following treatment goal was established: decrease emotional eating. [name] has demonstrated progress in her goal as evidenced by increased awareness of hunger patterns and triggers for emotional eating. Despite recent deviations from the structured meal plan, [name] continues to  demonstrate willingness to engage in learned skills.   Plan: [name] continues to appear able and willing to participate as evidenced by engagement in reciprocal conversation, and asking questions for clarification as appropriate. The next appointment will be scheduled in one week, which will be via [employer]. Once this provider's office resumes in-person appointments, [name] will be notified. The next session will focus on common cognitive distortions associated with emotional eating.

## 2018-06-06 ENCOUNTER — Ambulatory Visit (INDEPENDENT_AMBULATORY_CARE_PROVIDER_SITE_OTHER): Payer: BC Managed Care – PPO | Admitting: Psychology

## 2018-06-06 ENCOUNTER — Other Ambulatory Visit: Payer: Self-pay

## 2018-06-06 DIAGNOSIS — F3289 Other specified depressive episodes: Secondary | ICD-10-CM | POA: Diagnosis not present

## 2018-06-06 NOTE — Progress Notes (Addendum)
Office: [phone]  /  Fax: [phone]    Date: Jun 06, 2018    Appointment Start Time: 10:06am Duration: 26 minutes Provider: Glennie Isle, Psy.D. Type of Session: Individual Therapy  Location of Patient: Home Location of Provider: [facility] Type of Contact: Telepsychological Visit via [employer] Web[employer]   Session Content: [name] is a 38 y.o. female presenting via [place] for a follow-up appointment to address the previously established treatment goal of decreasing emotional eating. Today's appointment was a telepsychological visit, as this provider's clinic is closed for in-person visits due to COVID-19. Therapeutic services will resume to in-person appointments once the clinic re-opens. [name] expressed understanding regarding the rationale for telepsychological services, and provided verbal consent for today's appointment. Prior to proceeding with today's appointment, [name]'s physical location at the time of this appointment was obtained. [name] reported she was at home and provided the address. In the event of technical difficulties, [name] shared a phone number she could be reached at. [name] and this provider participated in today's telepsychological service. Also, [name] denied anyone else being present in the room or on the WebEx appointment. Of note, this provider called [name] at 10:02am as she did not present for today's appointment. [name] noted she believed the appointment was at 11:00am. Thus, today's appointment was initiated 6 minutes late.   This provider conducted a brief check-in and verbally administered the PHQ-9 and GAD-7. [name] reported she reached her goal of increasing eating congruent to the structured meal plan by 10%. She noted, "I'm proud of myself." [name] reported utilizing snack calories to "indulge." For the next two weeks, this provider recommended increasing her goal to 25%. [name] noted, "I'm already doing that," but was agreeable to  continuing. As such, it was recommended she call the clinic to schedule an appointment with [name]; [name] agreed. Moreover, [name] reported she has developed "more of a balance" and mindfulness has helped reduce work stress. She reported she is making her mindfulness practice her "own." As such, it was recommended she continue engaging in mindfulness. Due to recent events contributing to emotional eating, today's appointment focused on common cognitive distortions associated with emotional eating. Psychoeducation was provided and [name] was provided a handout outlining the aforementioned via e-mail. [name] provided verbal consent during today's appointment for this provider to send the handout via e-mail. [name] was receptive to today's session as evidenced by openness to sharing, responsiveness to feedback, and identifying cognitive distortions.  Mental Status Examination:  Appearance: neat Behavior: cooperative Mood: euthymic Affect: mood congruent Speech: normal in rate, volume, and tone Eye Contact: appropriate Psychomotor Activity: appropriate Thought Process: linear, logical, and goal directed  Content/Perceptual Disturbances: denies suicidal and homicidal ideation, plan, and intent and no hallucinations, delusions, bizarre thinking or behavior reported or observed Orientation: time, person, place and purpose of appointment Cognition/Sensorium: memory, attention, language, and fund of knowledge intact  Insight: good Judgment: good  Structured Assessment Results: The Patient Health Questionnaire-9 (PHQ-9) is a self-report measure that assesses symptoms and severity of depression over the course of the last two weeks; however, today's administration focused on the past week. [name] obtained a score of 0. Decreased interest 0  Down, depressed, hopeless 0  Altered sleeping 0  Tired, decreased energy 0  Change in appetite 0  Feeling bad or failure about yourself 0  Trouble concentrating  0  Moving slowly or fidgety/restless 0  Suicidal thoughts 0  PHQ-9 Score 0    The Generalized Anxiety Disorder-7 (GAD-7) is a  brief self-report measure that assesses symptoms of anxiety over the course of the last two weeks; however, today's administration focused on the past week. [name] obtained a score of 0. Nervous, anxious, on edge 0  Control/stop worrying 0  Worrying too much- different things 0  Trouble relaxing 0  Restless 0  Easily annoyed or irritable 0  Afraid-awful might happen 0  GAD-7 Score 0   Interventions:  Verbal administration of PHQ-9 and GAD-7 for symptom monitoring Reviewed content from the previous session Provided empathic reflections and validation Engaged patient in goal setting Psychoeducation provided regarding cognitive distortions  Provided positive reinforcement Employed insight oriented and cognitive psychotherapy interventions to identify and modify anxiety/mood producing thoughts and beliefs contributing to distress/emotional eating Conducted a brief chart review  DSM-5 Diagnosis: 311 (F32.8) Other Specified Depressive Disorder, Emotional Eating Behaviors  Treatment Goal & Progress: During the initial appointment with this provider, the following treatment goal was established: decrease emotional eating. [name] has demonstrated progress in her goal as evidenced by increased awareness of hunger patterns and triggers for emotional eating. Recent events resulted in [name] deviating from the structured meal plan, but during today's appointment, she discussed eating congruent to it.   Plan: [name] continues to appear able and willing to participate as evidenced by engagement in reciprocal conversation, and asking questions for clarification as appropriate. The next appointment will be scheduled in two weeks, which will be via [employer]. Once this provider's office resumes in-person appointments, [name] will be notified. The next session will focus on  reviewing cognitive distortions and the introduction of thought defusion.

## 2018-06-20 ENCOUNTER — Ambulatory Visit (INDEPENDENT_AMBULATORY_CARE_PROVIDER_SITE_OTHER): Payer: BC Managed Care – PPO | Admitting: Psychology

## 2018-06-20 ENCOUNTER — Other Ambulatory Visit: Payer: Self-pay

## 2018-06-20 DIAGNOSIS — F3289 Other specified depressive episodes: Secondary | ICD-10-CM

## 2018-06-20 NOTE — Progress Notes (Signed)
Office: [phone]  /  Fax: [phone]    Date: June 20, 2018    Appointment Start Time: 3:58pm Duration: 29 minutes Provider: Glennie Isle, Psy.D. Type of Session: Individual Therapy  Location of Patient: Home Location of Provider: Provider's Home Type of Contact: Telepsychological Visit via [employer] Web[employer]   Session Content: [name] is a 38 y.o. female presenting via [place] for a follow-up appointment to address the previously established treatment goal of decreasing emotional eating. Today's appointment was a telepsychological visit, as this provider's clinic is seeing a limited number of patients for in-person visits due to COVID-19. Therapeutic services will resume to in-person appointments once deemed appropriate. [name] expressed understanding regarding the rationale for telepsychological services, and provided verbal consent for today's appointment. Prior to proceeding with today's appointment, [name]'s physical location at the time of this appointment was obtained. [name] reported she was at home and provided the address. In the event of technical difficulties, [name] shared a phone number she could be reached at. [name] and this provider participated in today's telepsychological service. Also, [name] denied anyone else being present in the room or on the WebEx appointment.  This provider conducted a brief check-in and verbally administered the PHQ-9 and GAD-7. [name] initially shared she could not find the consent form sent by this provider via e-mail, but was able to locate it once this provider shared the date it was sent. [name] noted a plan to complete it and return it to this provider. Additionally, she shared her husband is Engineer, structural, which has contributed to stress due to recent events. She was observed crying. The aforementioned was validated and [name] discussed physically feeling tensed. Regarding eating, [name] indicated she is eating congruent to the meal plan and  focusing on increasing her water intake. She denied episodes of emotional eating since the last appointment with this provider despite recent stressors. This was positively reinforced. Due to recent stressors secondary to current events, this provider recommended [name] limit her social media and news consumption; [name] agreed. Due to recent stress and physical tension, [name] was introduced to progressive muscle relaxation (PMR). Psychoeducation regarding PMR was provided and she was led through an exercise. Following the exercise, Ligia indicated, "I do feel less tension." Emonii provided verbal consent during today's appointment for this provider to send the handout for the PMR exercise via e-mail. This provider also discussed utilizing YouTube for a PMR video. Session also focused on developing a coping plan for today to help reduce stress. She expressed willingness to engage in PMR again later tonight and also engage in one pleasurable activity before having to pick up her son. Moreover, this provider discussed the importance of [name] scheduling an appointment with [name] as her last appointment was March 23, 2018. [name] expressed understanding and indicated a plan to call the clinic and schedule an appointment. Overall, [name] was receptive to today's session as evidenced by openness to sharing, responsiveness to feedback, and willingness to engage in PMR.  Mental Status Examination:  Appearance: neat Behavior: cooperative Mood: sad Affect: mood congruent Speech: normal in rate, volume, and tone Eye Contact: appropriate Psychomotor Activity: appropriate Thought Process: linear, logical, and goal directed  Content/Perceptual Disturbances: denies suicidal and homicidal ideation, plan, and intent and no hallucinations, delusions, bizarre thinking or behavior reported or observed Orientation: time, person, place and purpose of appointment Cognition/Sensorium: memory, attention, language, and  fund of knowledge intact  Insight: good Judgment: good  Structured Assessment Results: The Patient Health Questionnaire-9 (PHQ-9) is a  self-report measure that assesses symptoms and severity of depression over the course of the last two weeks. [name] obtained a score of 2 suggesting minimal depression. [name] finds the endorsed symptoms to be not difficult at all. Decreased interest 0  Down, depressed, hopeless 0  Altered sleeping 0  Tired, decreased energy 2  Change in appetite 0  Feeling bad or failure about yourself 0  Trouble concentrating 0  Moving slowly or fidgety/restless 0  Suicidal thoughts 0  PHQ-9 Score 2    The Generalized Anxiety Disorder-7 (GAD-7) is a brief self-report measure that assesses symptoms of anxiety over the course of the last two weeks. [name] obtained a score of 1 suggesting minimal anxiety. [name] finds the endorsed symptoms to be not difficult at all. Nervous, anxious, on edge 1  Control/stop worrying 0  Worrying too much- different things 0  Trouble relaxing 0  Restless 0  Easily annoyed or irritable 0  Afraid-awful might happen 0  GAD-7 Score 1   Interventions:  Conducted a brief chart review Verbal administration of PHQ-9 and GAD-7 for symptom monitoring Provided empathic reflections and validation Provided positive reinforcement Employed supportive psychotherapy interventions to facilitate reduced distress, and to improve coping skills with identified stressors Engaged patient in progressive muscle relaxation  Psychoeducation provided regarding progressive muscle relaxation   DSM-5 Diagnosis: 311 (F32.8) Other Specified Depressive Disorder, Emotional Eating Behaviors  Treatment Goal & Progress: During the initial appointment with this provider, the following treatment goal was established: decrease emotional eating. [name] has demonstrated progress in her goal as evidenced by increased awareness of hunger patterns and triggers for emotional  eating. She denied episodes of emotional eating since the last appointment with this provider despite a recent increase in stressors. [name] also continues to demonstrate willingness to engage in learned skills.   Plan: Barbra continues to appear able and willing to participate as evidenced by engagement in reciprocal conversation, and asking questions for clarification as appropriate. The next appointment will be scheduled in two weeks, which will be via [employer]. Once this provider's office resumes in-person appointments and it is deemed appropriate, Twanna will be notified. The next session will focus on reviewing cognitive distortions and thought defusion, as it was not introduced today due to [place] presenting concerns.

## 2018-07-04 ENCOUNTER — Other Ambulatory Visit: Payer: Self-pay

## 2018-07-04 ENCOUNTER — Ambulatory Visit (INDEPENDENT_AMBULATORY_CARE_PROVIDER_SITE_OTHER): Payer: BC Managed Care – PPO | Admitting: Psychology

## 2018-07-04 DIAGNOSIS — F3289 Other specified depressive episodes: Secondary | ICD-10-CM

## 2018-07-04 NOTE — Progress Notes (Signed)
Office: [phone]  /  Fax: [phone]    Date: July 04, 2018    Appointment Start Time: 2:30pm Duration: 28 minutes Provider: Glennie Isle, Psy.D. Type of Session: Individual Therapy  Location of Patient: Home Location of Provider: [facility] Type of Contact: Telepsychological Visit via [employer] WebEx   Session Content: [name] is a 38 y.o. female presenting via [place] for a follow-up appointment to address the previously established treatment goal of decreasing emotional eating. Today's appointment was a telepsychological visit, as this provider's clinic is seeing a limited number of patients for in-person visits due to COVID-19. Therapeutic services will resume to in-person appointments once deemed appropriate. [name] expressed understanding regarding the rationale for telepsychological services, and provided verbal consent for today's appointment. Prior to proceeding with today's appointment, [name]'s physical location at the time of this appointment was obtained. [name] reported she was at home and provided the address. In the event of technical difficulties, [name] shared a phone number she could be reached at. Nissa and this provider participated in today's telepsychological service. Also, [name] denied anyone else being present in the room or on the WebEx appointment.  This provider conducted a brief check-in and verbally administered the PHQ-9 and GAD-7. This provider reminded [name] regarding the consent document previously discussed. [name] reported an increase in stress. This was explored, and she shared her husband is working extra hours and she is being asked to return to work in-person. [name] reported, "I'm just tired." Additionally, she also discussed further concern about her husband as she has noticed that he is stressed. The aforementioned has contributed to deviations from the structured meal plan last week. As such, it was recommended again that she  call the clinic to schedule an appointment with [name]. She agreed and she was observed writing down a reminder. Moreover, [name] reported that reducing social media and watching news has assisted with ongoing stress. This provider and Markiah also discussed the option of a referral for longer-term therapeutic services. [name] was receptive and provided verbal consent for this provider to place a referral and note in the referral that treatment would be ongoing stressors and emotional eating.   To assist with coping, psychoeducation regarding thought defusion, including its impact on emotional eating and overall well-being was provided. Morgann was led through a thought defusion exercise, and a handout with various exercises was provided via e-mail. Dejana was encouraged to engage in the thought defusion exercises between now and the next appointment with this provider. [name] agreed. She used the thought "I am not organized" for today's exercise. Following the exercise, [name] reported, "I can feel it like in my chest. A tensing sensation." As the exercise continued, she reported she felt "not as tense." [name] provided verbal consent during today's appointment for this provider to send the handout with thought defusion exercises via e-mail. Overall, [name] was receptive to today's session as evidenced by openness to sharing, responsiveness to feedback, and willingness to engage in thought defusion.  Mental Status Examination:  Appearance: neat Behavior: cooperative Mood: euthymic Affect: mood congruent Speech: normal in rate, volume, and tone Eye Contact: appropriate Psychomotor Activity: appropriate Thought Process: linear, logical, and goal directed  Content/Perceptual Disturbances: denies suicidal and homicidal ideation, plan, and intent and no hallucinations, delusions, bizarre thinking or behavior reported or observed Orientation: time, person, place and purpose of  appointment Cognition/Sensorium: memory, attention, language, and fund of knowledge intact  Insight: good Judgment: good  Structured Assessment Results: The Patient Health  Questionnaire-9 (PHQ-9) is a self-report measure that assesses symptoms and severity of depression over the course of the last two weeks. [name] obtained a score of 2 suggesting minimal depression. [name] finds the endorsed symptoms to be not difficult at all. Little interest or pleasure in doing things 0  Feeling down, depressed, or hopeless 0  Trouble falling or staying asleep, or sleeping too much 0  Feeling tired or having little energy 0  Poor appetite or overeating 1  Feeling bad about yourself --- or that you are a failure or have let yourself or your family down 1  Trouble concentrating on things, such as reading the newspaper or watching television 0  Moving or speaking so slowly that other people could have noticed? Or the opposite --- being so fidgety or restless that you have been moving around a lot more than usual 0  Thoughts that you would be better off dead or hurting yourself in some way 0  PHQ-9 Score 2    The Generalized Anxiety Disorder-7 (GAD-7) is a brief self-report measure that assesses symptoms of anxiety over the course of the last two weeks. Page obtained a score of 2 suggesting minimal anxiety. [name] finds the endorsed symptoms to be not difficult at all. Feeling nervous, anxious, on edge 0  Not being able to stop or control worrying 0  Worrying too much about different things 1  Trouble relaxing 1  Being so restless that it's hard to sit still 0  Becoming easily annoyed or irritable 0  Feeling afraid as if something awful might happen 0  GAD-7 Score 2   Interventions:  Conducted a brief chart review Verbal administration of PHQ-9 and GAD-7 for symptom monitoring Provided empathic reflections and validation Psychoeducation provided regarding thought defusion Engaged patient in a  thought defusion exercise Discussed option for a referral for longer-term therapeutic services Provided positive reinforcement Employed supportive psychotherapy interventions to facilitate reduced distress, and to improve coping skills with identified stressors Employed acceptance and commitment interventions to emphasize mindfulness and acceptance without struggle  DSM-5 Diagnosis: 311 (F32.8) Other Specified Depressive Disorder, Emotional Eating Behaviors  Treatment Goal & Progress: During the initial appointment with this provider, the following treatment goal was established: decrease emotional eating. Terrence has demonstrated progress in her goal as evidenced by increased awareness of hunger patterns and triggers for emotional eating. Tavi acknowledged difficulty coping secondary to ongoing stressors, but continues to demonstrate willingness to engage in learned skills and is willing to initiate longer-term therapeutic services.   Plan: Zareth continues to appear able and willing to participate as evidenced by engagement in reciprocal conversation, and asking questions for clarification as appropriate. The next appointment will be scheduled in two weeks, which will be via [employer]. Once this provider's office resumes in-person appointments and it is deemed appropriate, Vannie will be notified. The next session will focus further on thought defusion.

## 2018-07-17 NOTE — Progress Notes (Signed)
Office: [phone]  /  Fax: [phone]    Date: July 19, 2018   Appointment Start Time: 10:00am Duration: 27 minutes Provider: Glennie Isle, Psy.D. Type of Session: Individual Therapy  Location of Patient: Home Location of Provider: Provider's Home Type of Contact: Telepsychological Visit via [employer] Web[employer]   Session Content: [name] is a 38 y.o. female presenting via [place] for a follow-up appointment to address the previously established treatment goal of decreasing emotional eating. Today's appointment was a telepsychological visit, as this provider's clinic is seeing a limited number of patients for in-person visits due to COVID-19. Therapeutic services will resume to in-person appointments once deemed appropriate. [name] expressed understanding regarding the rationale for telepsychological services, and provided verbal consent for today's appointment. Prior to proceeding with today's appointment, [name]'s physical location at the time of this appointment was obtained. [name] reported she was at home and provided the address. In the event of technical difficulties, [name] shared a phone number she could be reached at. [name] and this provider participated in today's telepsychological service. Also, [name] denied anyone else being present in the room or on the WebEx appointment.  This provider conducted a brief check-in and verbally administered the PHQ-9 and GAD-7. 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 She noted she has an appointment with [name] today. Regarding the referral placed, [name] stated a message was left and she noted a plan to call back. Regarding the consent form for the telepsychological services, [name] reported she will take pictures of the signed document as she does not have access to a scanner. Regarding eating, [name] reported it is "hard" to get back to the meal  plan. It ws recommended she discuss this further with [name] during their appointment today. [name] agreed. Notably, she reported she "tried to do the normal" for breakfast starting this past Monday. Regarding emotional eating episodes, [name] stated she does not recall any episodes and denied overeating. However, she recalled a couple episodes of eating secondary to stress, but acknowledged they were smaller portions due to her increased awareness. This was reflected to her and [name] further discussed making better choices and engaging in portion control. Additionally, she discussed trying to include protein in every meal and snack. In regard to thought defusion, [name] stated she "sometimes" engaged in exercises when she was "beating [herself] up." This was explored and she described engaging in positive self-talk. As such, this was reflected to her and further psychoeducation regarding thought defusion was provided to help differentiate the two coping skills. She was then led through a thought defusion exercise in session, which involved the Happy Birthday song and she used the thought "I am a failure." Following the exercise, [name] reported, "It's not a good feeling." She was led through an additional thought defusion exercise involving the [employer]. She was observed laughing and noted, "It felt silly." [name] was encouraged to engage in thought defusion as needed to assist with coping; she agreed. Session then focused on termination planning. [name] noted an overall reduction in emotional eating since the onset of treatment with this provider. As such, the frequency of appointments will be reduced. The next appointment will be scheduled in 3 weeks, and if she continues to progress, another follow-up appointment will be scheduled in 4 weeks and will focus on termination. Overall, [name] was receptive to today's session as evidenced by openness to sharing, responsiveness to feedback, and willingness  to engage in learned skills to increase coping.  Mental Status Examination:  Appearance: neat Behavior: cooperative Mood: euthymic Affect: mood congruent Speech: normal in rate, volume, and tone Eye Contact: appropriate Psychomotor Activity: appropriate Thought Process: linear, logical, and goal directed  Content/Perceptual Disturbances: denies suicidal and homicidal ideation, plan, and intent and no hallucinations, delusions, bizarre thinking or behavior reported or observed Orientation: time, person, place and purpose of appointment Cognition/Sensorium: memory, attention, language, and fund of knowledge intact  Insight: good Judgment: good  Structured Assessment Results: The Patient Health Questionnaire-9 (PHQ-9) is a self-report measure that assesses symptoms and severity of depression over the course of the last two weeks. [name] obtained a score of 2 suggesting minimal depression. [name] finds the endorsed symptoms to be not difficult at all. Little interest or pleasure in doing things 0  Feeling down, depressed, or hopeless 0  Trouble falling or staying asleep, or sleeping too much 0  Feeling tired or having little energy 1  Poor appetite or overeating 0  Feeling bad about yourself --- or that you are a failure or have let yourself or your family down 1  Trouble concentrating on things, such as reading the newspaper or watching television 0  Moving or speaking so slowly that other people could have noticed? Or the opposite --- being so fidgety or restless that you have been moving around a lot more than usual 0  Thoughts that you would be better off dead or hurting yourself in some way 0  PHQ-9 Score 2    The Generalized Anxiety Disorder-7 (GAD-7) is a brief self-report measure that assesses symptoms of anxiety over the course of the last two weeks. [name] obtained a score of 1 suggesting minimal anxiety. [name] finds the endorsed symptoms to be not difficult at all. Feeling  nervous, anxious, on edge 0  Not being able to stop or control worrying 0  Worrying too much about different things 1  Trouble relaxing 0  Being so restless that it's hard to sit still 0  Becoming easily annoyed or irritable 0  Feeling afraid as if something awful might happen 0  GAD-7 Score 1   Interventions:  Conducted a brief chart review Verbal administration of PHQ-9 and GAD-7 for symptom monitoring Provided empathic reflections and validation Reviewed content from the previous session Psychoeducation provided regarding thought defusion Engaged patient in a thought defusion exercise Discussed termination planning Provided positive reinforcement Employed supportive psychotherapy interventions to facilitate reduced distress, and to improve coping skills with identified stressors  DSM-5 Diagnosis: 311 (F32.8) Other Specified Depressive Disorder, Emotional Eating Behaviors  Treatment Goal & Progress: During the initial appointment with this provider, the following treatment goal was established: decrease emotional eating. Zahrah has demonstrated progress in her goal as evidenced by increased awareness of hunger patterns and triggers for emotional eating. Despite deviations from the structured meal plan, Hadiyah reported an overall reduction in emotional eating since the onset of treatment with this provider. She also continues to demonstrate willingness to engage in learned skills to increase coping.   Plan: Emary continues to appear able and willing to participate as evidenced by engagement in reciprocal conversation, and asking questions for clarification as appropriate. The next appointment will be scheduled in three weeks, which will be via [employer]. Once this provider's office resumes in-person appointments and it is deemed appropriate, Janeshia will be notified. The next session will focus further on thought defusion.

## 2018-07-19 ENCOUNTER — Ambulatory Visit (INDEPENDENT_AMBULATORY_CARE_PROVIDER_SITE_OTHER): Payer: BC Managed Care – PPO | Admitting: Psychology

## 2018-07-19 ENCOUNTER — Ambulatory Visit (INDEPENDENT_AMBULATORY_CARE_PROVIDER_SITE_OTHER): Payer: BC Managed Care – PPO | Admitting: Bariatrics

## 2018-07-19 ENCOUNTER — Other Ambulatory Visit: Payer: Self-pay

## 2018-07-19 DIAGNOSIS — E559 Vitamin D deficiency, unspecified: Secondary | ICD-10-CM

## 2018-07-19 DIAGNOSIS — Z6835 Body mass index (BMI) 35.0-35.9, adult: Secondary | ICD-10-CM

## 2018-07-19 DIAGNOSIS — R7303 Prediabetes: Secondary | ICD-10-CM | POA: Diagnosis not present

## 2018-07-19 DIAGNOSIS — F3289 Other specified depressive episodes: Secondary | ICD-10-CM | POA: Diagnosis not present

## 2018-07-19 MED ORDER — CHOLECALCIFEROL 1.25 MG (50000 UT) PO TABS: ORAL_TABLET | ORAL | 0 refills | Status: DC

## 2018-07-19 MED ORDER — METFORMIN HCL 500 MG PO TABS: 500.0000 mg | ORAL_TABLET | Freq: Every day | ORAL | 0 refills | Status: DC

## 2018-07-20 ENCOUNTER — Encounter (INDEPENDENT_AMBULATORY_CARE_PROVIDER_SITE_OTHER): Payer: Self-pay | Admitting: Bariatrics

## 2018-07-20 NOTE — Progress Notes (Signed)
Office: [phone]  /  Fax: [phone] TeleHealth Visit:  [name] has verbally consented to this TeleHealth visit today. The patient is located at home, the provider is located at the [facility] office. The participants in this visit include the listed provider and patient and any and all parties involved. The visit was conducted today via FaceTime.  HPI:   Chief Complaint: OBESITY [name] is here to discuss her progress with her obesity treatment plan. She is on the Category 2 plan and is following her eating plan approximately 0 % of the time. She states she is exercising 0 minutes 0 times per week. [name] is up 3 pounds (weight 222 lbs). She has been stress eating. [name] stopped the eating plan for several weeks, but she has resumed. We were unable to weigh the patient today for this TeleHealth visit. She feels as if she has gained weight since her last visit. She has lost 11 lbs since starting treatment with [place].  Depression with emotional eating behaviors [name] is struggling with emotional eating and using food for comfort to the extent that it is negatively impacting her health. She often snacks when she is not hungry. Kinya sometimes feels she is out of control and then feels guilty that she made poor food choices. [name] has seen Dr. Mallie Mussel (bariatric psychologist). She has been working on behavior modification techniques to help reduce her emotional eating and has been somewhat successful. She shows no sign of suicidal or homicidal ideations.  Vitamin D deficiency [name] has a diagnosis of vitamin D deficiency. Her last vitamin D level was at 26.0 She is currently taking vit D and denies nausea, vomiting or muscle weakness.  Pre-Diabetes [name] has a diagnosis of prediabetes based on her elevated Hgb A1c and was informed this puts her at greater risk of developing diabetes. Her last A1c was at 5.8 and last insulin level was at 26.2 [name] was prescribed  metformin, but she has not taken the medication. [name] continues to work on diet and exercise to decrease risk of diabetes. She denies nausea or hypoglycemia.  ASSESSMENT AND PLAN:  Vitamin D deficiency  Other depression  Prediabetes - Plan: metFORMIN (GLUCOPHAGE) 500 MG tablet  Class 2 severe obesity with serious comorbidity and body mass index (BMI) of 35.0 to 35.9 in adult, unspecified obesity type ([place])  PLAN:  Depression with Emotional Eating Behaviors [name] will continue CBT strategies to help deal with her emotional eating and depression and follow up as directed.  Vitamin D Deficiency [name] was informed that low vitamin D levels contributes to fatigue and are associated with obesity, breast, and colon cancer. She agrees to continue to take prescription Vit D @50 ,000 IU every week #4 with no refills  and will follow up for routine testing of vitamin D, at least 2-3 times per year. She was informed of the risk of over-replacement of vitamin D and agrees to not increase her dose unless she discusses this with [place] first. [name] agrees to follow up as directed.  Pre-Diabetes [name] will continue to work on weight loss, exercise, and decreasing simple carbohydrates in her diet to help decrease the risk of diabetes. We dicussed metformin including benefits and risks. She was informed that eating too many simple carbohydrates or too many calories at one sitting increases the likelihood of GI side effects. [name] agreed to take metformin 500 mg once daily #30 with no refills and follow up with [place] as directed to monitor her progress.  Obesity [name] is currently in the action stage of change. As such, her goal is to continue with weight loss efforts She has agreed to follow the Category 2 plan [name] has been instructed to work up to a goal of 150 minutes of combined cardio and strengthening exercise per week for weight loss and overall health benefits. We discussed the following  Behavioral Modification Strategies today: increase H2O intake, no skipping meals, keeping healthy foods in the home,keep a strict food journal, increasing lean protein intake, decreasing simple carbohydrates, increasing vegetables, decrease eating out and work on meal planning and easy cooking plans  [name] has agreed to follow up with our clinic in 2 weeks. She was informed of the importance of frequent follow up visits to maximize her success with intensive lifestyle modifications for her multiple health conditions.  ALLERGIES: Allergies  Allergen Reactions  . Other     MEDICATIONS: Current Outpatient Medications on File Prior to Visit  Medication Sig Dispense Refill  . doxycycline (VIBRA-TABS) 100 MG tablet Take 1 tablet (100 mg total) by mouth 2 (two) times daily. (Patient not taking: Reported on 03/21/2018) 20 tablet 0  . ketoconazole (NIZORAL) 2 % shampoo Apply 1 application topically 2 (two) times a week.    . LO LOESTRIN FE 1 MG-10 MCG / 10 MCG tablet Take 28 mg by mouth daily.  11  . naproxen sodium (ANAPROX) 550 MG tablet Take 550 mg by mouth as needed.     No current facility-administered medications on file prior to visit.     PAST MEDICAL HISTORY: Past Medical History:  Diagnosis Date  . Class 2 obesity due to excess calories with body mass index (BMI) of 35.0 to 35.9 in adult 02/08/2018  . GERD (gastroesophageal reflux disease)    not currently  . Gestational diabetes mellitus (GDM), antepartum   . Heart murmur   . HSV infection   . Leaky heart valve   . Prediabetes   . Vitamin D deficiency     PAST SURGICAL HISTORY: Past Surgical History:  Procedure Laterality Date  . CESAREAN SECTION    . CESAREAN SECTION N/A 04/21/2015   Procedure: CESAREAN SECTION;  Surgeon: Waymon Amato, MD;  Location: [facility];  Service: Obstetrics;  Laterality: N/A;  . DILATION AND EVACUATION N/A 08/09/2013   Procedure: DILATATION AND EVACUATION;  Surgeon: Betsy Coder, MD;  Location: [facility];   Service: Gynecology;  Laterality: N/A;  . excision of pilonidal cyst    . EXCISION OF SKIN TAG Right 08/09/2013   Procedure: EXCISION OF SKIN TAG on right buttock;  Surgeon: Betsy Coder, MD;  Location: [facility];  Service: Gynecology;  Laterality: Right;  This procedure was added on after the patient had gone to sleep (as per Dr. Charlesetta Garibaldi)  . OPERATIVE ULTRASOUND N/A 08/09/2013   Procedure: OPERATIVE ULTRASOUND;  Surgeon: Betsy Coder, MD;  Location: [facility];  Service: Gynecology;  Laterality: N/A;  . TONSILLECTOMY      SOCIAL HISTORY: Social History   Tobacco Use  . Smoking status: Never Smoker  . Smokeless tobacco: Never Used  Substance Use Topics  . Alcohol use: Yes    Comment: rarely  . Drug use: No    FAMILY HISTORY: Family History  Problem Relation Age of Onset  . Hypertension Mother   . Sleep apnea Mother   . Obesity Mother   . Diabetes Father   . High Cholesterol Father   . Cancer Father   . Hypertension Brother   .  Diabetes Brother     ROS: Review of Systems  Constitutional: Negative for weight loss.  Gastrointestinal: Negative for nausea and vomiting.  Musculoskeletal:       Negative for muscle weakness  Endo/Heme/Allergies:       Negative for hypoglycemia  Psychiatric/Behavioral: Positive for depression. Negative for suicidal ideas.    PHYSICAL EXAM: Pt in no acute distress  RECENT LABS AND TESTS: BMET    Component Value Date/Time   NA 137 02/06/2018 1201   K 4.6 02/06/2018 1201   CL 102 02/06/2018 1201   CO2 21 02/06/2018 1201   GLUCOSE 99 02/06/2018 1201   GLUCOSE 123 (H) 09/27/2017 0913   BUN 11 02/06/2018 1201   CREATININE 0.61 02/06/2018 1201   CREATININE 0.82 09/27/2017 0913   CALCIUM 9.7 02/06/2018 1201   GFRNONAA 115 02/06/2018 1201   GFRNONAA 91 09/27/2017 0913   GFRAA 133 02/06/2018 1201   GFRAA 106 09/27/2017 0913   Lab Results  Component Value Date   HGBA1C 5.8 (H) 12/27/2017   HGBA1C 6.0 (H) 09/27/2017   HGBA1C 5.9 (H)  09/30/2011   Lab Results  Component Value Date   INSULIN 26.2 (H) 02/06/2018   CBC    Component Value Date/Time   WBC 7.1 02/06/2018 1201   WBC 7.0 09/27/2017 0913   RBC 4.36 02/06/2018 1201   RBC 4.19 09/27/2017 0913   HGB 12.0 02/06/2018 1201   HCT 37.2 02/06/2018 1201   PLT 367 09/27/2017 0913   MCV 85 02/06/2018 1201   MCH 27.5 02/06/2018 1201   MCH 27.7 09/27/2017 0913   MCHC 32.3 02/06/2018 1201   MCHC 33.0 09/27/2017 0913   RDW 12.7 02/06/2018 1201   LYMPHSABS 2.1 02/06/2018 1201   MONOABS 0.7 02/26/2014 1246   EOSABS 0.2 02/06/2018 1201   BASOSABS 0.0 02/06/2018 1201   Iron/TIBC/Ferritin/ %Sat    Component Value Date/Time   IRON 54 09/30/2017 1607   TIBC 373 09/30/2017 1607   FERRITIN 81 09/30/2017 1607   IRONPCTSAT 14 (L) 09/30/2017 1607   Lipid Panel     Component Value Date/Time   CHOL 149 09/27/2017 0913   TRIG 81 09/27/2017 0913   HDL 44 (L) 09/27/2017 0913   CHOLHDL 3.4 09/27/2017 0913   LDLCALC 88 09/27/2017 0913   Hepatic Function Panel     Component Value Date/Time   PROT 7.2 02/06/2018 1201   ALBUMIN 4.3 02/06/2018 1201   AST 11 02/06/2018 1201   ALT 9 02/06/2018 1201   ALKPHOS 52 02/06/2018 1201   BILITOT 0.3 02/06/2018 1201      Component Value Date/Time   TSH 1.400 02/06/2018 1201   TSH 1.61 09/27/2017 0913   TSH 1.611 09/30/2011 1300     Ref. Range 02/06/2018 12:01  Vitamin D, 25-Hydroxy Latest Ref Range: 30.0 - 100.0 ng/mL 26.0 (L)    I, Doreene Nest, am acting as Location manager for [employer]. Owens Shark, DO  I have reviewed the above documentation for accuracy and completeness, and I agree with the above. -Jearld Lesch, DO

## 2018-08-03 ENCOUNTER — Encounter (INDEPENDENT_AMBULATORY_CARE_PROVIDER_SITE_OTHER): Payer: Self-pay | Admitting: Bariatrics

## 2018-08-03 ENCOUNTER — Ambulatory Visit (INDEPENDENT_AMBULATORY_CARE_PROVIDER_SITE_OTHER): Payer: BC Managed Care – PPO | Admitting: Bariatrics

## 2018-08-03 ENCOUNTER — Other Ambulatory Visit: Payer: Self-pay

## 2018-08-03 VITALS — BP 119/81 | HR 70 | Temp 98.4°F | Ht 65.0 in | Wt 220.0 lb

## 2018-08-03 DIAGNOSIS — F3289 Other specified depressive episodes: Secondary | ICD-10-CM | POA: Diagnosis not present

## 2018-08-03 DIAGNOSIS — E559 Vitamin D deficiency, unspecified: Secondary | ICD-10-CM

## 2018-08-03 DIAGNOSIS — Z6836 Body mass index (BMI) 36.0-36.9, adult: Secondary | ICD-10-CM

## 2018-08-03 DIAGNOSIS — R7303 Prediabetes: Secondary | ICD-10-CM

## 2018-08-08 NOTE — Progress Notes (Signed)
Office: [phone]  /  Fax: [phone]   HPI:   Chief Complaint: OBESITY [name] is here to discuss her progress with her obesity treatment plan. She is on the Category 2 plan and is following her eating plan approximately 75 % of the time. She states she is exercising 0 minutes 0 times per week. [name] has gained 1 pound. She has done well overall. She has over the last few 2 weeks. She has done some stress eating. [name] is drinking more water. Her weight is 220 lb (99.8 kg) today and has had a weight loss of 1 pound over a period of 2 weeks since her last visit. She has lost 13 lbs since starting treatment with [place].  Pre-Diabetes [name] has a diagnosis of prediabetes based on her elevated Hgb A1c and was informed this puts her at greater risk of developing diabetes. [name] is taking metformin, and she started this week. [name] continues to work on diet and exercise to decrease risk of diabetes. She denies nausea or hypoglycemia.  Depression with emotional eating behaviors [name] is struggling with emotional eating and using food for comfort to the extent that it is negatively impacting her health. She often snacks when she is not hungry. [name] sometimes feels she is out of control and then feels guilty that she made poor food choices. [name] sees Dr. Mallie Mussel. She has been working on behavior modification techniques to help reduce her emotional eating and has been somewhat successful. She shows no sign of suicidal or homicidal ideations.  Vitamin D deficiency [name] has a diagnosis of vitamin D deficiency. Her last vitamin D level was at 26.0 She is currently taking vit D and denies nausea, vomiting or muscle weakness.  ASSESSMENT AND PLAN:  Prediabetes  Other depression  Vitamin D deficiency  Class 2 severe obesity with serious comorbidity and body mass index (BMI) of 36.0 to 36.9 in adult, unspecified obesity type [name])  PLAN:  Pre-Diabetes [name] will continue to work on  weight loss, exercise, and decreasing simple carbohydrates in her diet to help decrease the risk of diabetes. We dicussed metformin including benefits and risks. She was informed that eating too many simple carbohydrates or too many calories at one sitting increases the likelihood of GI side effects. [name] will continue metformin for now and a prescription was not written today. [name] agreed to follow up with [place] as directed to monitor her progress.  Depression with Emotional Eating Behaviors We discussed behavior modification techniques today to help [name] deal with her emotional eating and depression. She will follow up with Dr. Mallie Mussel.  Vitamin D Deficiency [name] was informed that low vitamin D levels contributes to fatigue and are associated with obesity, breast, and colon cancer. She will continue to take prescription Vit D @50 ,000 IU every week and will follow up for routine testing of vitamin D, at least 2-3 times per year. She was informed of the risk of over-replacement of vitamin D and agrees to not increase her dose unless she discusses this with [place] first.  I spent > than 50% of the 15 minute visit on counseling as documented in the note.  Obesity [name] is currently in the action stage of change. As such, her goal is to continue with weight loss efforts She has agreed to follow the Category 2 plan [name] has been instructed to work up to a goal of 150 minutes of combined cardio and strengthening exercise per week for weight loss and overall health benefits. We discussed  the following Behavioral Modification Strategies today: planning for success, increase H2O intake, no skipping meals, keeping healthy foods in the home, increasing lean protein intake, decreasing simple carbohydrates, increasing vegetables, decrease eating out and work on meal planning and intentional eating  [name] has agreed to follow up with our clinic in 2 weeks fasting. She was informed of the importance of  frequent follow up visits to maximize her success with intensive lifestyle modifications for her multiple health conditions.  ALLERGIES: Allergies  Allergen Reactions  . Other     MEDICATIONS: Current Outpatient Medications on File Prior to Visit  Medication Sig Dispense Refill  . Cholecalciferol 1.25 MG (50000 UT) TABS 1 tablet weekly 4 tablet 0  . doxycycline (VIBRA-TABS) 100 MG tablet Take 1 tablet (100 mg total) by mouth 2 (two) times daily. 20 tablet 0  . ketoconazole (NIZORAL) 2 % shampoo Apply 1 application topically 2 (two) times a week.    . LO LOESTRIN FE 1 MG-10 MCG / 10 MCG tablet Take 28 mg by mouth daily.  11  . metFORMIN (GLUCOPHAGE) 500 MG tablet Take 1 tablet (500 mg total) by mouth daily with breakfast. 30 tablet 0  . naproxen sodium (ANAPROX) 550 MG tablet Take 550 mg by mouth as needed.     No current facility-administered medications on file prior to visit.     PAST MEDICAL HISTORY: Past Medical History:  Diagnosis Date  . Class 2 obesity due to excess calories with body mass index (BMI) of 35.0 to 35.9 in adult 02/08/2018  . GERD (gastroesophageal reflux disease)    not currently  . Gestational diabetes mellitus (GDM), antepartum   . Heart murmur   . HSV infection   . Leaky heart valve   . Prediabetes   . Vitamin D deficiency     PAST SURGICAL HISTORY: Past Surgical History:  Procedure Laterality Date  . CESAREAN SECTION    . CESAREAN SECTION N/A 04/21/2015   Procedure: CESAREAN SECTION;  Surgeon: Waymon Amato, MD;  Location: [facility];  Service: Obstetrics;  Laterality: N/A;  . DILATION AND EVACUATION N/A 08/09/2013   Procedure: DILATATION AND EVACUATION;  Surgeon: Betsy Coder, MD;  Location: [facility];  Service: Gynecology;  Laterality: N/A;  . excision of pilonidal cyst    . EXCISION OF SKIN TAG Right 08/09/2013   Procedure: EXCISION OF SKIN TAG on right buttock;  Surgeon: Betsy Coder, MD;  Location: [facility];  Service: Gynecology;  Laterality: Right;   This procedure was added on after the patient had gone to sleep (as per Dr. Charlesetta Garibaldi)  . OPERATIVE ULTRASOUND N/A 08/09/2013   Procedure: OPERATIVE ULTRASOUND;  Surgeon: Betsy Coder, MD;  Location: [facility];  Service: Gynecology;  Laterality: N/A;  . TONSILLECTOMY      SOCIAL HISTORY: Social History   Tobacco Use  . Smoking status: Never Smoker  . Smokeless tobacco: Never Used  Substance Use Topics  . Alcohol use: Yes    Comment: rarely  . Drug use: No    FAMILY HISTORY: Family History  Problem Relation Age of Onset  . Hypertension Mother   . Sleep apnea Mother   . Obesity Mother   . Diabetes Father   . High Cholesterol Father   . Cancer Father   . Hypertension Brother   . Diabetes Brother     ROS: Review of Systems  Constitutional: Negative for weight loss.  Gastrointestinal: Negative for nausea and vomiting.  Musculoskeletal:  Negative for muscle weakness  Endo/Heme/Allergies:       Negative for hypoglycemia  Psychiatric/Behavioral: Positive for depression. Negative for suicidal ideas.    PHYSICAL EXAM: Blood pressure 119/81, pulse 70, temperature 98.4 F (36.9 C), height 5\' 5"  (1.651 m), weight 220 lb (99.8 kg), SpO2 97 %, unknown if currently breastfeeding. Body mass index is 36.61 kg/m. Physical Exam Vitals signs reviewed.  Constitutional:      Appearance: Normal appearance. She is well-developed. She is obese.  Cardiovascular:     Rate and Rhythm: Normal rate.  Pulmonary:     Effort: Pulmonary effort is normal.  Musculoskeletal: Normal range of motion.  Skin:    General: Skin is warm and dry.  Neurological:     Mental Status: She is alert and oriented to person, place, and time.  Psychiatric:        Mood and Affect: Mood normal.        Behavior: Behavior normal.        Thought Content: Thought content does not include homicidal or suicidal ideation.     RECENT LABS AND TESTS: BMET    Component Value Date/Time   NA 137 02/06/2018 1201    K 4.6 02/06/2018 1201   CL 102 02/06/2018 1201   CO2 21 02/06/2018 1201   GLUCOSE 99 02/06/2018 1201   GLUCOSE 123 (H) 09/27/2017 0913   BUN 11 02/06/2018 1201   CREATININE 0.61 02/06/2018 1201   CREATININE 0.82 09/27/2017 0913   CALCIUM 9.7 02/06/2018 1201   GFRNONAA 115 02/06/2018 1201   GFRNONAA 91 09/27/2017 0913   GFRAA 133 02/06/2018 1201   GFRAA 106 09/27/2017 0913   Lab Results  Component Value Date   HGBA1C 5.8 (H) 12/27/2017   HGBA1C 6.0 (H) 09/27/2017   HGBA1C 5.9 (H) 09/30/2011   Lab Results  Component Value Date   INSULIN 26.2 (H) 02/06/2018   CBC    Component Value Date/Time   WBC 7.1 02/06/2018 1201   WBC 7.0 09/27/2017 0913   RBC 4.36 02/06/2018 1201   RBC 4.19 09/27/2017 0913   HGB 12.0 02/06/2018 1201   HCT 37.2 02/06/2018 1201   PLT 367 09/27/2017 0913   MCV 85 02/06/2018 1201   MCH 27.5 02/06/2018 1201   MCH 27.7 09/27/2017 0913   MCHC 32.3 02/06/2018 1201   MCHC 33.0 09/27/2017 0913   RDW 12.7 02/06/2018 1201   LYMPHSABS 2.1 02/06/2018 1201   MONOABS 0.7 02/26/2014 1246   EOSABS 0.2 02/06/2018 1201   BASOSABS 0.0 02/06/2018 1201   Iron/TIBC/Ferritin/ %Sat    Component Value Date/Time   IRON 54 09/30/2017 1607   TIBC 373 09/30/2017 1607   FERRITIN 81 09/30/2017 1607   IRONPCTSAT 14 (L) 09/30/2017 1607   Lipid Panel     Component Value Date/Time   CHOL 149 09/27/2017 0913   TRIG 81 09/27/2017 0913   HDL 44 (L) 09/27/2017 0913   CHOLHDL 3.4 09/27/2017 0913   LDLCALC 88 09/27/2017 0913   Hepatic Function Panel     Component Value Date/Time   PROT 7.2 02/06/2018 1201   ALBUMIN 4.3 02/06/2018 1201   AST 11 02/06/2018 1201   ALT 9 02/06/2018 1201   ALKPHOS 52 02/06/2018 1201   BILITOT 0.3 02/06/2018 1201      Component Value Date/Time   TSH 1.400 02/06/2018 1201   TSH 1.61 09/27/2017 0913   TSH 1.611 09/30/2011 1300     Ref. Range 02/06/2018 12:01  Vitamin D, 25-Hydroxy Latest Ref Range:  30.0 - 100.0 ng/mL 26.0 (L)     OBESITY BEHAVIORAL INTERVENTION VISIT  Today's visit was # 6   Starting weight: 233 lbs Starting date: 02/06/2018 Today's weight : 220 lbs Today's date: 08/03/2018 Total lbs lost to date: 13    08/03/2018  Height 5\' 5"  (1.651 m)  Weight 220 lb (99.8 kg)  BMI (Calculated) 36.61  BLOOD PRESSURE - SYSTOLIC 622  BLOOD PRESSURE - DIASTOLIC 81   Body Fat % 63.3 %  Total Body Water (lbs) 88.4 lbs    ASK: We discussed the diagnosis of obesity with [name] today and [name] agreed to give [place] permission to discuss obesity behavioral modification therapy today.  ASSESS: Chrysa has the diagnosis of obesity and her BMI today is 36.61 Lavilla is in the action stage of change   ADVISE: Shelbey was educated on the multiple health risks of obesity as well as the benefit of weight loss to improve her health. She was advised of the need for long term treatment and the importance of lifestyle modifications to improve her current health and to decrease her risk of future health problems.  AGREE: Multiple dietary modification options and treatment options were discussed and  Karlin agreed to follow the recommendations documented in the above note.  ARRANGE: Carlon was educated on the importance of frequent visits to treat obesity as outlined per CMS and USPSTF guidelines and agreed to schedule her next follow up appointment today.  Corey Skains, am acting as Location manager for [employer]. Owens Shark, DO  I have reviewed the above documentation for accuracy and completeness, and I agree with the above. -Jearld Lesch, DO

## 2018-08-08 NOTE — Progress Notes (Signed)
Office: [phone]  /  Fax: [phone]    Date: August 09, 2018   Appointment Start Time: 10:32am Duration: 30 minutes Provider: Glennie Isle, Psy.D. Type of Session: Individual Therapy  Location of Patient: Home Location of Provider: Provider's Home Type of Contact: Telepsychological Visit via [employer] Web[employer]   Session Content: [name] is a 38 y.o. female presenting via [place] for a follow-up appointment to address the previously established treatment goal of decreasing emotional eating. Today's appointment was a telepsychological visit, as this provider's clinic is seeing a limited number of patients for in-person visits due to COVID-19. Therapeutic services will resume to in-person appointments once deemed appropriate. [name] expressed understanding regarding the rationale for telepsychological services, and provided verbal consent for today's appointment. Prior to proceeding with today's appointment, [name]'s physical location at the time of this appointment was obtained. [name] reported she was at home and provided the address. In the event of technical difficulties, [name] shared a phone number she could be reached at. [name] and this provider participated in today's telepsychological service. Also, [name] denied anyone else being present in the room or on the WebEx appointment.  This provider conducted a brief check-in and verbally administered the PHQ-9 and GAD-7. [name] shared, "I've been doing better with the eating plan, but I have had a lot of anxiety which has led to emotional eating." She discussed using mindfulness to help her cope. This was positively reinforced. This provider also reviewed engaging in pleasurable activities regularly as previously discussed. Regarding emotional eating, [name] endorsed a couple episodes during which she ate chocolate and [place] fries. She identified fatigue and a desire to reward herself as triggers for emotional eating. Moreover, [name] discussed  she was initially unsure of seeing a new provider; however, she indicated she scheduled an appointment. This was explored and normalized. To assist with coping, [name] was led through a thought defusion exercise, "Leaves on a Stream" After the exercise, [name]'s experience was processed. She noted, "It was relaxing." She indicated "watching stressors go by" was "helpful." [name] provided verbal consent during today's appointment for this provider to send the handout for the thought defusion exercise via e-mail. [name] was receptive to today's session as evidenced by openness to sharing, responsiveness to feedback, and willingness to engage in learned skills to assist with coping.  Mental Status Examination:  Appearance: neat Behavior: cooperative Mood: euthymic Affect: mood congruent Speech: normal in rate, volume, and tone Eye Contact: appropriate Psychomotor Activity: appropriate Thought Process: linear, logical, and goal directed  Content/Perceptual Disturbances: denies suicidal and homicidal ideation, plan, and intent and no hallucinations, delusions, bizarre thinking or behavior reported or observed Orientation: time, person, place and purpose of appointment Cognition/Sensorium: memory, attention, language, and fund of knowledge intact  Insight: good Judgment: good  Structured Assessment Results: The Patient Health Questionnaire-9 (PHQ-9) is a self-report measure that assesses symptoms and severity of depression over the course of the last two weeks. [name] obtained a score of 0. Little interest or pleasure in doing things 0  Feeling down, depressed, or hopeless 0  Trouble falling or staying asleep, or sleeping too much 0  Feeling tired or having little energy 0  Poor appetite or overeating 0  Feeling bad about yourself --- or that you are a failure or have let yourself or your family down 0  Trouble concentrating on things, such as reading the newspaper or watching television 0  Moving  or speaking so slowly that other people could have noticed? Or the opposite --- being  so fidgety or restless that you have been moving around a lot more than usual 0  Thoughts that you would be better off dead or hurting yourself in some way 0  PHQ-9 Score 0    The Generalized Anxiety Disorder-7 (GAD-7) is a brief self-report measure that assesses symptoms of anxiety over the course of the last two weeks. [name] obtained a score of 4 suggesting minimal anxiety. [name] finds the endorsed symptoms to be not difficult at all. Feeling nervous, anxious, on edge 1  Not being able to stop or control worrying 1  Worrying too much about different things 1  Trouble relaxing 1  Being so restless that it's hard to sit still 0  Becoming easily annoyed or irritable 0  Feeling afraid as if something awful might happen 0  GAD-7 Score 4   Interventions:  Conducted a brief chart review Verbal administration of PHQ-9 and GAD-7 for symptom monitoring Provided empathic reflections and validation Reviewed content from the previous session Engaged patient in a thought defusion exercise Provided positive reinforcement Employed supportive psychotherapy interventions to facilitate reduced distress, and to improve coping skills with identified stressors Employed acceptance and commitment interventions to emphasize mindfulness and acceptance without struggle  DSM-5 Diagnosis: 311 (F32.8) Other Specified Depressive Disorder, Emotional Eating Behaviors  Treatment Goal & Progress: During the initial appointment with this provider, the following treatment goal was established: decrease emotional eating. [name] has demonstrated progress in her goal as evidenced by increased awareness of hunger patterns and triggers for emotional eating. Despite recent episodes of emotional eating, she was able to identify triggers and demonstrated willingness to engage in learned skills to assist with coping.  Plan: [name] continues to  appear able and willing to participate as evidenced by engagement in reciprocal conversation, and asking questions for clarification as appropriate. The next appointment will be scheduled in three weeks, which will be via [employer]. Once this provider's office resumes in-person appointments and it is deemed appropriate, [name] will be notified. The next session will focus on reviewing learned skills and termination.

## 2018-08-09 ENCOUNTER — Other Ambulatory Visit: Payer: Self-pay

## 2018-08-09 ENCOUNTER — Ambulatory Visit (INDEPENDENT_AMBULATORY_CARE_PROVIDER_SITE_OTHER): Payer: BC Managed Care – PPO | Admitting: Psychology

## 2018-08-09 DIAGNOSIS — F3289 Other specified depressive episodes: Secondary | ICD-10-CM | POA: Diagnosis not present

## 2018-08-22 ENCOUNTER — Ambulatory Visit (INDEPENDENT_AMBULATORY_CARE_PROVIDER_SITE_OTHER): Payer: BC Managed Care – PPO | Admitting: Professional

## 2018-08-22 DIAGNOSIS — F411 Generalized anxiety disorder: Secondary | ICD-10-CM

## 2018-08-22 DIAGNOSIS — F331 Major depressive disorder, recurrent, moderate: Secondary | ICD-10-CM

## 2018-08-28 NOTE — Progress Notes (Signed)
Office: [phone]  /  Fax: [phone]    Date: August 29, 2018   Appointment Start Time: 10:31am Duration: 26 minutes Provider: Glennie Isle, Psy.D. Type of Session: Individual Therapy  Location of Patient: Home Location of Provider: Provider's Home Type of Contact: Telepsychological Visit via [employer] Web[employer]   Session Content: [name] is a 38 y.o. female presenting via [place] for a follow-up appointment to address the previously established treatment goal of decreasing emotional eating. Today's appointment was a telepsychological visit, as this provider's clinic is seeing a limited number of patients for in-person visits due to COVID-19. Therapeutic services will resume to in-person appointments once deemed appropriate. [name] expressed understanding regarding the rationale for telepsychological services, and provided verbal consent for today's appointment. Prior to proceeding with today's appointment, [name]'s physical location at the time of this appointment was obtained. [name] reported she was at home and provided the address. In the event of technical difficulties, [name] shared a phone number she could be reached at. [name] and this provider participated in today's telepsychological service. Also, [name] denied anyone else being present in the room or on the WebEx appointment.  This provider conducted a brief check-in and verbally administered the PHQ-9 and GAD-7. [name] stated she met with her new therapist and she noted, "It went good." She also shared about current events, including appointments for her children. Notably, [name] stated, "I've just completely given up on the meal plan." However, she shared she is scheduled for an appointment with [name]. Moreover, skills discussed to date were reviewed. [name] shared she continues to focus on engaging in pleasurable activities, including reading, and walking. She also discussed engaging in mindfulness exercises. This was positively  reinforced. [name] further reported she continues to "look at the negatives" and what she believes she has done wrong. Thus, it was recommended she discuss self-compassion with her new therapist. She was agreeable. [name] also described a desire to learn other skills to cope with stress and anxiety. Thus, she noted a plan to set a goal with her new provider to increase coping skills. Furthermore, a plan was developed to help [name] cope with emotional eating secondary to stress and unpleasant emotions using learned skills. She wrote down the following plan: focus on hydration, be prepared with snacks congruent to the meal plan, pause to ask questions when triggered to eat (e.g., Am I really hungry?; Is there something bothering me? Will I feel better if I eat?), and engage in pleasurable activities and/or other coping skills (e.g., mindfulness or thought defusion) after going through the aforementioned questions. Overall, [name] was receptive to today's session as evidenced by openness to sharing, responsiveness to feedback, and willingness to engage in learned skills.  Mental Status Examination:  Appearance: neat Behavior: cooperative Mood: euthymic Affect: mood congruent Speech: normal in rate, volume, and tone Eye Contact: appropriate Psychomotor Activity: appropriate Thought Process: linear, logical, and goal directed  Content/Perceptual Disturbances: denies suicidal and homicidal ideation, plan, and intent and no hallucinations, delusions, bizarre thinking or behavior reported or observed Orientation: time, person, place and purpose of appointment Cognition/Sensorium: memory, attention, language, and fund of knowledge intact  Insight: fair Judgment: fair  Structured Assessment Results: The Patient Health Questionnaire-9 (PHQ-9) is a self-report measure that assesses symptoms and severity of depression over the course of the last two weeks. [name] obtained a score of 0. Little interest or  pleasure in doing things 0  Feeling down, depressed, or hopeless 0  Trouble falling or staying asleep, or sleeping too  much 0  Feeling tired or having little energy 0  Poor appetite or overeating 0  Feeling bad about yourself --- or that you are a failure or have let yourself or your family down 0  Trouble concentrating on things, such as reading the newspaper or watching television 0  Moving or speaking so slowly that other people could have noticed? Or the opposite --- being so fidgety or restless that you have been moving around a lot more than usual 0  Thoughts that you would be better off dead or hurting yourself in some way 0  PHQ-9 Score 0    The Generalized Anxiety Disorder-7 (GAD-7) is a brief self-report measure that assesses symptoms of anxiety over the course of the last two weeks. [name] obtained a score of 1 suggesting minimal anxiety. [name] finds the endorsed symptoms to be not difficult at all. Feeling nervous, anxious, on edge 1  Not being able to stop or control worrying 0  Worrying too much about different things 0  Trouble relaxing 0  Being so restless that it's hard to sit still 0  Becoming easily annoyed or irritable 0  Feeling afraid as if something awful might happen 0  GAD-7 Score 1   Interventions:  Conducted a brief chart review Verbal administration of PHQ-9 and GAD-7 for symptom monitoring Provided empathic reflections and validation Reviewed learned skills Provided positive reinforcement Employed supportive psychotherapy interventions to facilitate reduced distress, and to improve coping skills with identified stressors  DSM-5 Diagnosis: 311 (F32.8) Other Specified Depressive Disorder, Emotional Eating Behaviors  Treatment Goal & Progress: During the initial appointment with this provider, the following treatment goal was established: decrease emotional eating. [name] has demonstrated progress in her goal as evidenced by increased awareness of hunger  patterns and triggers for emotional eating. Despite deviations from the structured meal plan and engagement in emotional eating over the course of treatment with this provider, [name] reported she continues to engage in learned skills.   Plan: [name] is scheduled for a follow-up appointment with her new behavioral health provider on August 31, 2018. No further follow-up planned by this provider.

## 2018-08-29 ENCOUNTER — Ambulatory Visit (INDEPENDENT_AMBULATORY_CARE_PROVIDER_SITE_OTHER): Payer: BC Managed Care – PPO | Admitting: Psychology

## 2018-08-29 ENCOUNTER — Other Ambulatory Visit: Payer: Self-pay

## 2018-08-29 DIAGNOSIS — F3289 Other specified depressive episodes: Secondary | ICD-10-CM | POA: Diagnosis not present

## 2018-08-30 ENCOUNTER — Other Ambulatory Visit (INDEPENDENT_AMBULATORY_CARE_PROVIDER_SITE_OTHER): Payer: Self-pay | Admitting: Bariatrics

## 2018-08-30 ENCOUNTER — Ambulatory Visit (INDEPENDENT_AMBULATORY_CARE_PROVIDER_SITE_OTHER): Payer: BC Managed Care – PPO | Admitting: Bariatrics

## 2018-08-30 ENCOUNTER — Other Ambulatory Visit: Payer: Self-pay

## 2018-08-30 ENCOUNTER — Encounter (INDEPENDENT_AMBULATORY_CARE_PROVIDER_SITE_OTHER): Payer: Self-pay | Admitting: Bariatrics

## 2018-08-30 VITALS — BP 114/77 | HR 85 | Temp 98.4°F | Ht 65.0 in | Wt 226.0 lb

## 2018-08-30 DIAGNOSIS — Z9189 Other specified personal risk factors, not elsewhere classified: Secondary | ICD-10-CM

## 2018-08-30 DIAGNOSIS — R7303 Prediabetes: Secondary | ICD-10-CM

## 2018-08-30 DIAGNOSIS — E559 Vitamin D deficiency, unspecified: Secondary | ICD-10-CM | POA: Diagnosis not present

## 2018-08-30 DIAGNOSIS — Z6837 Body mass index (BMI) 37.0-37.9, adult: Secondary | ICD-10-CM

## 2018-08-30 DIAGNOSIS — F3289 Other specified depressive episodes: Secondary | ICD-10-CM | POA: Diagnosis not present

## 2018-08-30 MED ORDER — BUPROPION HCL ER (SR) 150 MG PO TB12: 150.0000 mg | ORAL_TABLET | Freq: Every day | ORAL | 0 refills | Status: DC

## 2018-08-30 MED ORDER — VITAMIN D (ERGOCALCIFEROL) 1.25 MG (50000 UNIT) PO CAPS: 50000.0000 [IU] | ORAL_CAPSULE | ORAL | 0 refills | Status: DC

## 2018-08-31 ENCOUNTER — Ambulatory Visit (INDEPENDENT_AMBULATORY_CARE_PROVIDER_SITE_OTHER): Payer: BC Managed Care – PPO | Admitting: Professional

## 2018-08-31 DIAGNOSIS — F411 Generalized anxiety disorder: Secondary | ICD-10-CM | POA: Diagnosis not present

## 2018-08-31 DIAGNOSIS — F331 Major depressive disorder, recurrent, moderate: Secondary | ICD-10-CM | POA: Diagnosis not present

## 2018-08-31 LAB — COMPREHENSIVE METABOLIC PANEL
ALT: 8 IU/L (ref 0–32)
AST: 8 IU/L (ref 0–40)
Albumin/Globulin Ratio: 1.6 (ref 1.2–2.2)
Albumin: 4.4 g/dL (ref 3.8–4.8)
Alkaline Phosphatase: 46 IU/L (ref 39–117)
BUN/Creatinine Ratio: 17 (ref 9–23)
BUN: 12 mg/dL (ref 6–20)
Bilirubin Total: 0.3 mg/dL (ref 0.0–1.2)
CO2: 21 mmol/L (ref 20–29)
Calcium: 9.3 mg/dL (ref 8.7–10.2)
Chloride: 103 mmol/L (ref 96–106)
Creatinine, Ser: 0.69 mg/dL (ref 0.57–1.00)
GFR calc Af Amer: 128 mL/min/{1.73_m2} (ref >59)
GFR calc non Af Amer: 111 mL/min/{1.73_m2} (ref >59)
Globulin, Total: 2.8 g/dL (ref 1.5–4.5)
Glucose: 117 mg/dL — ABNORMAL HIGH (ref 65–99)
Potassium: 4.6 mmol/L (ref 3.5–5.2)
Sodium: 137 mmol/L (ref 134–144)
Total Protein: 7.2 g/dL (ref 6.0–8.5)

## 2018-08-31 LAB — HEMOGLOBIN A1C
Est. average glucose Bld gHb Est-mCnc: 117 mg/dL
Hgb A1c MFr Bld: 5.7 % — ABNORMAL HIGH (ref 4.8–5.6)

## 2018-08-31 LAB — LIPID PANEL WITH LDL/HDL RATIO
Cholesterol, Total: 141 mg/dL (ref 100–199)
HDL: 48 mg/dL (ref >39)
LDL Calculated: 80 mg/dL (ref 0–99)
LDl/HDL Ratio: 1.7 ratio (ref 0.0–3.2)
Triglycerides: 65 mg/dL (ref 0–149)
VLDL Cholesterol Cal: 13 mg/dL (ref 5–40)

## 2018-08-31 LAB — INSULIN, RANDOM: INSULIN: 33 u[IU]/mL — ABNORMAL HIGH (ref 2.6–24.9)

## 2018-08-31 LAB — VITAMIN D 25 HYDROXY (VIT D DEFICIENCY, FRACTURES): Vit D, 25-Hydroxy: 43.9 ng/mL (ref 30.0–100.0)

## 2018-09-04 NOTE — Progress Notes (Signed)
Office: [phone]  /  Fax: [phone]   HPI:   Chief Complaint: OBESITY [name] is here to discuss her progress with her obesity treatment plan. She is on the Category 2 plan and is following her eating plan approximately 0 % of the time. She states she is exercising 0 minutes 0 times per week. [name] is up six pounds, but she has been doing well overall. She has struggled over the last few weeks. She states that she will have more of a routine starting next week.  Her weight is 226 lb (102.5 kg) today and has had a weight loss of 6 pounds over a period of 4 weeks since her last visit. She has lost 7 lbs since starting treatment with [place].  Depression with emotional eating behaviors [name] is doing minimal stress eating. She struggles with emotional eating and using food for comfort to the extent that it is negatively impacting her health. She often snacks when she is not hungry. [name] sometimes feels she is out of control and then feels guilty that she made poor food choices. [name] is seeing Dr. Mallie Mussel (bariatric psychologist). She has been working on behavior modification techniques to help reduce her emotional eating and has been somewhat successful. She shows no sign of suicidal or homicidal ideations.  Vitamin D deficiency [name] has a diagnosis of vitamin D deficiency. Her last vitamin D level was at 26.0 She is currently taking vit D and denies nausea, vomiting or muscle weakness.  At risk for osteopenia and osteoporosis [name] is at higher risk of osteopenia and osteoporosis due to vitamin D deficiency.   Pre-Diabetes [name] has a diagnosis of prediabetes based on her elevated Hgb A1c and was informed this puts her at greater risk of developing diabetes. She is taking metformin currently and continues to work on diet and exercise to decrease risk of diabetes. [name] has a normal appetite.  ASSESSMENT AND PLAN:  Vitamin D deficiency - Plan: VITAMIN D 25 Hydroxy (Vit-D  Deficiency, Fractures), Vitamin D, Ergocalciferol, (DRISDOL) 1.25 MG (50000 UT) CAPS capsule  Prediabetes - Plan: Comprehensive metabolic panel, Hemoglobin A1c, Lipid Panel With LDL/HDL Ratio, Insulin, random  Other depression - Plan: buPROPion (WELLBUTRIN SR) 150 MG 12 hr tablet  At risk for osteoporosis  Class 2 severe obesity with serious comorbidity and body mass index (BMI) of 37.0 to 37.9 in adult, unspecified obesity type (HCC)  PLAN:  Depression with Emotional Eating Behaviors We discussed behavior modification techniques today to help [name] deal with her emotional eating and depression. She has agreed to take Wellbutrin SR 150 mg daily #30 with no refills and she will follow up with someone at [facility] [name] agrees to follow up with our clinic in 2 weeks.  Vitamin D Deficiency [name] was informed that low vitamin D levels contributes to fatigue and are associated with obesity, breast, and colon cancer. [name] agrees to take prescription Vit D @50 ,000 IU every week #4 with no refills and she will follow up for routine testing of vitamin D, at least 2-3 times per year. She was informed of the risk of over-replacement of vitamin D and agrees to not increase her dose unless she discusses this with [place] first. [name] agrees to follow up with our clinic in 2 weeks.  At risk for osteopenia and osteoporosis [name] was given extended  (15 minutes) osteoporosis prevention counseling today. [name] is at risk for osteopenia and osteoporosis due to her vitamin D deficiency. She was encouraged to take her vitamin  D and follow her higher calcium diet and increase strengthening exercise to help strengthen her bones and decrease her risk of osteopenia and osteoporosis.  Pre-Diabetes [name] will continue to work on weight loss, exercise, increasing lean protein and decreasing simple carbohydrates in her diet to help decrease the risk of diabetes. She was informed that eating too many simple  carbohydrates or too many calories at one sitting increases the likelihood of GI side effects. We will check A1c and insulin level and [name] will follow up with [place] as directed to monitor her progress.  Obesity [name] is currently in the action stage of change. As such, her goal is to continue with weight loss efforts She has agreed to follow the Category 2 plan [name] has been instructed to work up to a goal of 150 minutes of combined cardio and strengthening exercise per week for weight loss and overall health benefits. We discussed the following Behavioral Modification Strategies today: increase H2O intake, no soda, no skipping meals, keeping healthy foods in the home, increasing lean protein intake, decreasing simple carbohydrates, increasing vegetables, decrease eating out and work on meal planning and intentional eating  [name] has agreed to follow up with our clinic in 2 weeks. She was informed of the importance of frequent follow up visits to maximize her success with intensive lifestyle modifications for her multiple health conditions.  ALLERGIES: Allergies  Allergen Reactions  . Other     MEDICATIONS: Current Outpatient Medications on File Prior to Visit  Medication Sig Dispense Refill  . Cholecalciferol 1.25 MG (50000 UT) TABS 1 tablet weekly 4 tablet 0  . doxycycline (VIBRA-TABS) 100 MG tablet Take 1 tablet (100 mg total) by mouth 2 (two) times daily. 20 tablet 0  . ketoconazole (NIZORAL) 2 % shampoo Apply 1 application topically 2 (two) times a week.    . LO LOESTRIN FE 1 MG-10 MCG / 10 MCG tablet Take 28 mg by mouth daily.  11  . metFORMIN (GLUCOPHAGE) 500 MG tablet Take 1 tablet (500 mg total) by mouth daily with breakfast. 30 tablet 0  . naproxen sodium (ANAPROX) 550 MG tablet Take 550 mg by mouth as needed.     No current facility-administered medications on file prior to visit.     PAST MEDICAL HISTORY: Past Medical History:  Diagnosis Date  . Class 2 obesity due to  excess calories with body mass index (BMI) of 35.0 to 35.9 in adult 02/08/2018  . GERD (gastroesophageal reflux disease)    not currently  . Gestational diabetes mellitus (GDM), antepartum   . Heart murmur   . HSV infection   . Leaky heart valve   . Prediabetes   . Vitamin D deficiency     PAST SURGICAL HISTORY: Past Surgical History:  Procedure Laterality Date  . CESAREAN SECTION    . CESAREAN SECTION N/A 04/21/2015   Procedure: CESAREAN SECTION;  Surgeon: Waymon Amato, MD;  Location: [facility];  Service: Obstetrics;  Laterality: N/A;  . DILATION AND EVACUATION N/A 08/09/2013   Procedure: DILATATION AND EVACUATION;  Surgeon: Betsy Coder, MD;  Location: [facility];  Service: Gynecology;  Laterality: N/A;  . excision of pilonidal cyst    . EXCISION OF SKIN TAG Right 08/09/2013   Procedure: EXCISION OF SKIN TAG on right buttock;  Surgeon: Betsy Coder, MD;  Location: [facility];  Service: Gynecology;  Laterality: Right;  This procedure was added on after the patient had gone to sleep (as per Dr. Charlesetta Garibaldi)  . OPERATIVE  ULTRASOUND N/A 08/09/2013   Procedure: OPERATIVE ULTRASOUND;  Surgeon: Betsy Coder, MD;  Location: [facility];  Service: Gynecology;  Laterality: N/A;  . TONSILLECTOMY      SOCIAL HISTORY: Social History   Tobacco Use  . Smoking status: Never Smoker  . Smokeless tobacco: Never Used  Substance Use Topics  . Alcohol use: Yes    Comment: rarely  . Drug use: No    FAMILY HISTORY: Family History  Problem Relation Age of Onset  . Hypertension Mother   . Sleep apnea Mother   . Obesity Mother   . Diabetes Father   . High Cholesterol Father   . Cancer Father   . Hypertension Brother   . Diabetes Brother     ROS: Review of Systems  Constitutional: Negative for weight loss.  Gastrointestinal: Negative for nausea and vomiting.  Musculoskeletal:       Negative for muscle weakness  Endo/Heme/Allergies:       Negative for polyphagia  Psychiatric/Behavioral: Positive for  depression. Negative for suicidal ideas.    PHYSICAL EXAM: Blood pressure 114/77, pulse 85, temperature 98.4 F (36.9 C), temperature source Oral, height 5\' 5"  (1.651 m), weight 226 lb (102.5 kg), SpO2 99 %, unknown if currently breastfeeding. Body mass index is 37.61 kg/m. Physical Exam Vitals signs reviewed.  Constitutional:      Appearance: Normal appearance. She is well-developed. She is obese.  Cardiovascular:     Rate and Rhythm: Normal rate.  Pulmonary:     Effort: Pulmonary effort is normal.  Musculoskeletal: Normal range of motion.  Skin:    General: Skin is warm and dry.  Neurological:     Mental Status: She is alert and oriented to person, place, and time.  Psychiatric:        Mood and Affect: Mood normal.        Behavior: Behavior normal.        Thought Content: Thought content does not include homicidal or suicidal ideation.     RECENT LABS AND TESTS: BMET    Component Value Date/Time   NA 137 08/30/2018 1349   K 4.6 08/30/2018 1349   CL 103 08/30/2018 1349   CO2 21 08/30/2018 1349   GLUCOSE 117 (H) 08/30/2018 1349   GLUCOSE 123 (H) 09/27/2017 0913   BUN 12 08/30/2018 1349   CREATININE 0.69 08/30/2018 1349   CREATININE 0.82 09/27/2017 0913   CALCIUM 9.3 08/30/2018 1349   GFRNONAA 111 08/30/2018 1349   GFRNONAA 91 09/27/2017 0913   GFRAA 128 08/30/2018 1349   GFRAA 106 09/27/2017 0913   Lab Results  Component Value Date   HGBA1C 5.7 (H) 08/30/2018   HGBA1C 5.8 (H) 12/27/2017   HGBA1C 6.0 (H) 09/27/2017   HGBA1C 5.9 (H) 09/30/2011   Lab Results  Component Value Date   INSULIN 33.0 (H) 08/30/2018   INSULIN 26.2 (H) 02/06/2018   CBC    Component Value Date/Time   WBC 7.1 02/06/2018 1201   WBC 7.0 09/27/2017 0913   RBC 4.36 02/06/2018 1201   RBC 4.19 09/27/2017 0913   HGB 12.0 02/06/2018 1201   HCT 37.2 02/06/2018 1201   PLT 367 09/27/2017 0913   MCV 85 02/06/2018 1201   MCH 27.5 02/06/2018 1201   MCH 27.7 09/27/2017 0913   MCHC 32.3  02/06/2018 1201   MCHC 33.0 09/27/2017 0913   RDW 12.7 02/06/2018 1201   LYMPHSABS 2.1 02/06/2018 1201   MONOABS 0.7 02/26/2014 1246   EOSABS 0.2 02/06/2018 1201  BASOSABS 0.0 02/06/2018 1201   Iron/TIBC/Ferritin/ %Sat    Component Value Date/Time   IRON 54 09/30/2017 1607   TIBC 373 09/30/2017 1607   FERRITIN 81 09/30/2017 1607   IRONPCTSAT 14 (L) 09/30/2017 1607   Lipid Panel     Component Value Date/Time   CHOL 141 08/30/2018 1349   TRIG 65 08/30/2018 1349   HDL 48 08/30/2018 1349   CHOLHDL 3.4 09/27/2017 0913   LDLCALC 80 08/30/2018 1349   LDLCALC 88 09/27/2017 0913   Hepatic Function Panel     Component Value Date/Time   PROT 7.2 08/30/2018 1349   ALBUMIN 4.4 08/30/2018 1349   AST 8 08/30/2018 1349   ALT 8 08/30/2018 1349   ALKPHOS 46 08/30/2018 1349   BILITOT 0.3 08/30/2018 1349      Component Value Date/Time   TSH 1.400 02/06/2018 1201   TSH 1.61 09/27/2017 0913   TSH 1.611 09/30/2011 1300     Ref. Range 02/06/2018 12:01  Vitamin D, 25-Hydroxy Latest Ref Range: 30.0 - 100.0 ng/mL 26.0 (L)    OBESITY BEHAVIORAL INTERVENTION VISIT  Today's visit was # 7   Starting weight: 233 Starting date: 02/06/2018 Today's weight :  226 Today's date: 08/30/2018 Total lbs lost to date: 7    08/30/2018  Height 5\' 5"  (1.651 m)  Weight 226 lb (102.5 kg)  BMI (Calculated) 37.61  BLOOD PRESSURE - SYSTOLIC 974  BLOOD PRESSURE - DIASTOLIC 77   Body Fat % 16.3 %  Total Body Water (lbs) 90.4 lbs    ASK: We discussed the diagnosis of obesity with [name] today and Kailan agreed to give [place] permission to discuss obesity behavioral modification therapy today.  ASSESS: Laquisha has the diagnosis of obesity and her BMI today is 37.61 [name] is in the action stage of change   ADVISE: [name] was educated on the multiple health risks of obesity as well as the benefit of weight loss to improve her health. She was advised of the need for long term treatment and the  importance of lifestyle modifications to improve her current health and to decrease her risk of future health problems.  AGREE: Multiple dietary modification options and treatment options were discussed and  [name] agreed to follow the recommendations documented in the above note.  ARRANGE: [name] was educated on the importance of frequent visits to treat obesity as outlined per CMS and USPSTF guidelines and agreed to schedule her next follow up appointment today.  Corey Skains, am acting as Location manager for [employer]. Owens Shark, DO  I have reviewed the above documentation for accuracy and completeness, and I agree with the above. -Jearld Lesch, DO

## 2018-09-07 ENCOUNTER — Ambulatory Visit: Payer: BC Managed Care – PPO | Admitting: Professional

## 2018-09-11 ENCOUNTER — Ambulatory Visit (INDEPENDENT_AMBULATORY_CARE_PROVIDER_SITE_OTHER): Payer: BC Managed Care – PPO | Admitting: Professional

## 2018-09-11 ENCOUNTER — Ambulatory Visit: Payer: BC Managed Care – PPO | Admitting: Professional

## 2018-09-11 DIAGNOSIS — F411 Generalized anxiety disorder: Secondary | ICD-10-CM

## 2018-09-11 DIAGNOSIS — F331 Major depressive disorder, recurrent, moderate: Secondary | ICD-10-CM | POA: Diagnosis not present

## 2018-09-13 ENCOUNTER — Ambulatory Visit (INDEPENDENT_AMBULATORY_CARE_PROVIDER_SITE_OTHER): Payer: BC Managed Care – PPO | Admitting: Bariatrics

## 2018-09-13 ENCOUNTER — Other Ambulatory Visit: Payer: Self-pay

## 2018-09-13 ENCOUNTER — Encounter (INDEPENDENT_AMBULATORY_CARE_PROVIDER_SITE_OTHER): Payer: Self-pay | Admitting: Bariatrics

## 2018-09-13 VITALS — BP 113/74 | HR 89 | Temp 98.4°F | Ht 65.0 in | Wt 225.0 lb

## 2018-09-13 DIAGNOSIS — R7303 Prediabetes: Secondary | ICD-10-CM | POA: Diagnosis not present

## 2018-09-13 DIAGNOSIS — R5383 Other fatigue: Secondary | ICD-10-CM | POA: Diagnosis not present

## 2018-09-13 DIAGNOSIS — Z9189 Other specified personal risk factors, not elsewhere classified: Secondary | ICD-10-CM | POA: Diagnosis not present

## 2018-09-13 DIAGNOSIS — K219 Gastro-esophageal reflux disease without esophagitis: Secondary | ICD-10-CM

## 2018-09-13 NOTE — Progress Notes (Signed)
Office: [phone]  /  Fax: [phone]   HPI:   Chief Complaint: OBESITY [name] is here to discuss her progress with her obesity treatment plan. She is on the Category 2 plan and is following her eating plan approximately 50% of the time. She states she is walking 20 minutes 3 times per week. [name] is down 1 lb. She states she is getting adequate water. Her weight is 225 lb (102.1 kg) today and has had a weight loss of 1 pound over a period of 2 weeks since her last visit. She has lost 8 lbs since starting treatment with [place].  Pre-Diabetes [name] has a diagnosis of prediabetes based on her elevated Hgb A1c and was informed this puts her at greater risk of developing diabetes. Last A1c was 5.7 on 08/30/2018 with an insulin of 33.0. She is taking metformin currently but has not taken consistently. She continues to work on diet and exercise to decrease risk of diabetes. She denies nausea or hypoglycemia.  At risk for diabetes [name] is at higher than average risk for developing diabetes due to her obesity. She currently denies polyuria or polydipsia.  Gastroesophageal Reflux Disease (GERD) [name] is on no medications and reports no symptoms at this time.  Fatigue [name] notes temperature changes have increased. She is not on BCP's.  ASSESSMENT AND PLAN:  Prediabetes  Gastroesophageal reflux disease without esophagitis  Other fatigue  At risk for diabetes mellitus  PLAN:  Pre-Diabetes [name] will continue to work on weight loss, exercise, and decreasing simple carbohydrates in her diet to help decrease the risk of diabetes. We dicussed metformin including benefits and risks. She was informed that eating too many simple carbohydrates or too many calories at one sitting increases the likelihood of GI side effects. [name] was advised to decrease carbohydrates and increase protein. She will continue walking and will continue taking metformin. She will follow-up with [place] as directed  to monitor her progress.  Diabetes risk counseling [name] was given extended (15 minutes) diabetes prevention counseling today. She is 38 y.o. female and has risk factors for diabetes including obesity. We discussed intensive lifestyle modifications today with an emphasis on weight loss as well as increasing exercise and decreasing simple carbohydrates in her diet.  Gastroesophageal Reflux Disease (GERD) [name] was instructed to watch her triggers and also to eat frequent meals.  Fatigue [name] was informed that her fatigue may be related to obesity, depression or many other causes. Thyroid panel will be ordered, and in the meanwhile [name] has agreed to work on diet, exercise and weight loss to help with fatigue. Proper sleep hygiene was discussed including the need for 7-8 hours of quality sleep each night.  Obesity [name] is currently in the action stage of change. As such, her goal is to continue with weight loss efforts. She has agreed to follow the Category 2 plan. [name] will work on meal planning and intentional eating. She will get back consistently on the plan. We reviewed her labs today. [name] has been instructed to work up to a goal of 150 minutes of combined cardio and strengthening exercise per week for weight loss and overall health benefits. We discussed the following Behavioral Modification Strategies today: increasing lean protein intake, decreasing simple carbohydrates, increasing vegetables, increase H20 intake, decrease eating out, no skipping meals, work on meal planning and easy cooking plans, and keeping healthy foods in the home.  [name] has agreed to follow-up with our clinic in 3 weeks. She was informed of the  importance of frequent follow-up visits to maximize her success with intensive lifestyle modifications for her multiple health conditions.  ALLERGIES: Allergies  Allergen Reactions   Other     MEDICATIONS: Current Outpatient Medications on File Prior to  Visit  Medication Sig Dispense Refill   buPROPion (WELLBUTRIN SR) 150 MG 12 hr tablet Take 1 tablet (150 mg total) by mouth daily. 30 tablet 0   Cholecalciferol 1.25 MG (50000 UT) TABS 1 tablet weekly 4 tablet 0   doxycycline (VIBRA-TABS) 100 MG tablet Take 1 tablet (100 mg total) by mouth 2 (two) times daily. 20 tablet 0   ketoconazole (NIZORAL) 2 % shampoo Apply 1 application topically 2 (two) times a week.     LO LOESTRIN FE 1 MG-10 MCG / 10 MCG tablet Take 28 mg by mouth daily.  11   metFORMIN (GLUCOPHAGE) 500 MG tablet Take 1 tablet (500 mg total) by mouth daily with breakfast. 30 tablet 0   naproxen sodium (ANAPROX) 550 MG tablet Take 550 mg by mouth as needed.     Vitamin D, Ergocalciferol, (DRISDOL) 1.25 MG (50000 UT) CAPS capsule Take 1 capsule (50,000 Units total) by mouth every 7 (seven) days. 4 capsule 0   No current facility-administered medications on file prior to visit.     PAST MEDICAL HISTORY: Past Medical History:  Diagnosis Date   Class 2 obesity due to excess calories with body mass index (BMI) of 35.0 to 35.9 in adult 02/08/2018   GERD (gastroesophageal reflux disease)    not currently   Gestational diabetes mellitus (GDM), antepartum    Heart murmur    HSV infection    Leaky heart valve    Prediabetes    Vitamin D deficiency     PAST SURGICAL HISTORY: Past Surgical History:  Procedure Laterality Date   CESAREAN SECTION     CESAREAN SECTION N/A 04/21/2015   Procedure: CESAREAN SECTION;  Surgeon: Waymon Amato, MD;  Location: [facility];  Service: Obstetrics;  Laterality: N/A;   DILATION AND EVACUATION N/A 08/09/2013   Procedure: DILATATION AND EVACUATION;  Surgeon: Betsy Coder, MD;  Location: [facility];  Service: Gynecology;  Laterality: N/A;   excision of pilonidal cyst     EXCISION OF SKIN TAG Right 08/09/2013   Procedure: EXCISION OF SKIN TAG on right buttock;  Surgeon: Betsy Coder, MD;  Location: [facility];  Service: Gynecology;  Laterality:  Right;  This procedure was added on after the patient had gone to sleep (as per Dr. Charlesetta Garibaldi)   [place] N/A 08/09/2013   Procedure: OPERATIVE ULTRASOUND;  Surgeon: Betsy Coder, MD;  Location: [facility];  Service: Gynecology;  Laterality: N/A;   TONSILLECTOMY      SOCIAL HISTORY: Social History   Tobacco Use   Smoking status: Never Smoker   Smokeless tobacco: Never Used  Substance Use Topics   Alcohol use: Yes    Comment: rarely   Drug use: No    FAMILY HISTORY: Family History  Problem Relation Age of Onset   Hypertension Mother    Sleep apnea Mother    Obesity Mother    Diabetes Father    High Cholesterol Father    Cancer Father    Hypertension Brother    Diabetes Brother    ROS: Review of Systems  Constitutional: Positive for malaise/fatigue.  Gastrointestinal:       Positive for GERD.   PHYSICAL EXAM: Blood pressure 113/74, pulse 89, temperature 98.4 F (36.9 C), temperature source Oral, height  5\' 5"  (1.651 m), weight 225 lb (102.1 kg), last menstrual period 09/08/2018, SpO2 98 %, unknown if currently breastfeeding. Body mass index is 37.44 kg/m. Physical Exam Vitals signs reviewed.  Constitutional:      Appearance: Normal appearance. She is obese.  Cardiovascular:     Rate and Rhythm: Normal rate.     Pulses: Normal pulses.  Pulmonary:     Effort: Pulmonary effort is normal.     Breath sounds: Normal breath sounds.  Musculoskeletal: Normal range of motion.  Skin:    General: Skin is warm and dry.  Neurological:     Mental Status: She is alert and oriented to person, place, and time.  Psychiatric:        Behavior: Behavior normal.   RECENT LABS AND TESTS: BMET    Component Value Date/Time   NA 137 08/30/2018 1349   K 4.6 08/30/2018 1349   CL 103 08/30/2018 1349   CO2 21 08/30/2018 1349   GLUCOSE 117 (H) 08/30/2018 1349   GLUCOSE 123 (H) 09/27/2017 0913   BUN 12 08/30/2018 1349   CREATININE 0.69 08/30/2018 1349    CREATININE 0.82 09/27/2017 0913   CALCIUM 9.3 08/30/2018 1349   GFRNONAA 111 08/30/2018 1349   GFRNONAA 91 09/27/2017 0913   GFRAA 128 08/30/2018 1349   GFRAA 106 09/27/2017 0913   Lab Results  Component Value Date   HGBA1C 5.7 (H) 08/30/2018   HGBA1C 5.8 (H) 12/27/2017   HGBA1C 6.0 (H) 09/27/2017   HGBA1C 5.9 (H) 09/30/2011   Lab Results  Component Value Date   INSULIN 33.0 (H) 08/30/2018   INSULIN 26.2 (H) 02/06/2018   CBC    Component Value Date/Time   WBC 7.1 02/06/2018 1201   WBC 7.0 09/27/2017 0913   RBC 4.36 02/06/2018 1201   RBC 4.19 09/27/2017 0913   HGB 12.0 02/06/2018 1201   HCT 37.2 02/06/2018 1201   PLT 367 09/27/2017 0913   MCV 85 02/06/2018 1201   MCH 27.5 02/06/2018 1201   MCH 27.7 09/27/2017 0913   MCHC 32.3 02/06/2018 1201   MCHC 33.0 09/27/2017 0913   RDW 12.7 02/06/2018 1201   LYMPHSABS 2.1 02/06/2018 1201   MONOABS 0.7 02/26/2014 1246   EOSABS 0.2 02/06/2018 1201   BASOSABS 0.0 02/06/2018 1201   Iron/TIBC/Ferritin/ %Sat    Component Value Date/Time   IRON 54 09/30/2017 1607   TIBC 373 09/30/2017 1607   FERRITIN 81 09/30/2017 1607   IRONPCTSAT 14 (L) 09/30/2017 1607   Lipid Panel     Component Value Date/Time   CHOL 141 08/30/2018 1349   TRIG 65 08/30/2018 1349   HDL 48 08/30/2018 1349   CHOLHDL 3.4 09/27/2017 0913   LDLCALC 80 08/30/2018 1349   LDLCALC 88 09/27/2017 0913   Hepatic Function Panel     Component Value Date/Time   PROT 7.2 08/30/2018 1349   ALBUMIN 4.4 08/30/2018 1349   AST 8 08/30/2018 1349   ALT 8 08/30/2018 1349   ALKPHOS 46 08/30/2018 1349   BILITOT 0.3 08/30/2018 1349      Component Value Date/Time   TSH 1.400 02/06/2018 1201   TSH 1.61 09/27/2017 0913   TSH 1.611 09/30/2011 1300   Results for CHELSIA, SWALLOWS (MRN [id number]) as of 09/13/2018 11:39  Ref. Range 08/30/2018 13:49  Vitamin D, 25-Hydroxy Latest Ref Range: 30.0 - 100.0 ng/mL 43.9   OBESITY BEHAVIORAL INTERVENTION VISIT  Today's visit was #8    Starting weight: 233 lbs Starting date: 02/06/2018 Today's  weight: 225 lbs  Today's date: 09/13/2018 Total lbs lost to date: 8    09/13/2018  Height 5\' 5"  (1.651 m)  Weight 225 lb (102.1 kg)  BMI (Calculated) 37.44  BLOOD PRESSURE - SYSTOLIC 123456  BLOOD PRESSURE - DIASTOLIC 74   Body Fat % [id number] %  Total Body Water (lbs) 88.2 lbs   ASK: We discussed the diagnosis of obesity with Richardean Sale today and Dulcie agreed to give [place] permission to discuss obesity behavioral modification therapy today.  ASSESS: Elani has the diagnosis of obesity and her BMI today is 37.5. Channell is in the action stage of change.   ADVISE: Tericka was educated on the multiple health risks of obesity as well as the benefit of weight loss to improve her health. She was advised of the need for long term treatment and the importance of lifestyle modifications to improve her current health and to decrease her risk of future health problems.  AGREE: Multiple dietary modification options and treatment options were discussed and  Lorna agreed to follow the recommendations documented in the above note.  ARRANGE: Maralee was educated on the importance of frequent visits to treat obesity as outlined per CMS and USPSTF guidelines and agreed to schedule her next follow up appointment today.  Migdalia Dk, am acting as Location manager for [employer], DO  I have reviewed the above documentation for accuracy and completeness, and I agree with the above. -Jearld Lesch, DO

## 2018-09-13 NOTE — Addendum Note (Signed)
Addended by: Wilfrid Lund on: 09/13/2018 01:48 PM   Modules accepted: Orders

## 2018-09-14 LAB — TSH: TSH: 1.87 u[IU]/mL (ref 0.450–4.500)

## 2018-09-14 LAB — T3: T3, Total: 150 ng/dL (ref 71–180)

## 2018-09-14 LAB — T4, FREE: Free T4: 0.97 ng/dL (ref 0.82–1.77)

## 2018-09-21 ENCOUNTER — Ambulatory Visit (INDEPENDENT_AMBULATORY_CARE_PROVIDER_SITE_OTHER): Payer: BC Managed Care – PPO | Admitting: Professional

## 2018-09-21 DIAGNOSIS — F411 Generalized anxiety disorder: Secondary | ICD-10-CM | POA: Diagnosis not present

## 2018-09-21 DIAGNOSIS — F331 Major depressive disorder, recurrent, moderate: Secondary | ICD-10-CM

## 2018-09-26 ENCOUNTER — Encounter (INDEPENDENT_AMBULATORY_CARE_PROVIDER_SITE_OTHER): Payer: Self-pay | Admitting: Bariatrics

## 2018-09-28 ENCOUNTER — Ambulatory Visit (INDEPENDENT_AMBULATORY_CARE_PROVIDER_SITE_OTHER): Payer: BC Managed Care – PPO | Admitting: Professional

## 2018-09-28 DIAGNOSIS — F411 Generalized anxiety disorder: Secondary | ICD-10-CM

## 2018-09-28 DIAGNOSIS — F331 Major depressive disorder, recurrent, moderate: Secondary | ICD-10-CM

## 2018-10-02 ENCOUNTER — Other Ambulatory Visit: Payer: Self-pay

## 2018-10-02 ENCOUNTER — Other Ambulatory Visit: Payer: BC Managed Care – PPO | Admitting: Internal Medicine

## 2018-10-02 DIAGNOSIS — Z0000 Encounter for general adult medical examination without abnormal findings: Secondary | ICD-10-CM

## 2018-10-02 LAB — CBC WITH DIFFERENTIAL/PLATELET
Absolute Monocytes: 493 cells/uL (ref 200–950)
Basophils Absolute: 32 cells/uL (ref 0–200)
Basophils Relative: 0.5 %
Eosinophils Absolute: 70 cells/uL (ref 15–500)
Eosinophils Relative: 1.1 %
HCT: 35.4 % (ref 35.0–45.0)
Hemoglobin: 11.8 g/dL (ref 11.7–15.5)
Lymphs Abs: 1926 cells/uL (ref 850–3900)
MCH: 28.7 pg (ref 27.0–33.0)
MCHC: 33.3 g/dL (ref 32.0–36.0)
MCV: 86.1 fL (ref 80.0–100.0)
MPV: 10.6 fL (ref 7.5–12.5)
Monocytes Relative: 7.7 %
Neutro Abs: 3878 cells/uL (ref 1500–7800)
Neutrophils Relative %: 60.6 %
Platelets: 360 10*3/uL (ref 140–400)
RBC: 4.11 10*6/uL (ref 3.80–5.10)
RDW: 12.5 % (ref 11.0–15.0)
Total Lymphocyte: 30.1 %
WBC: 6.4 10*3/uL (ref 3.8–10.8)

## 2018-10-04 ENCOUNTER — Other Ambulatory Visit: Payer: Self-pay

## 2018-10-04 ENCOUNTER — Encounter (INDEPENDENT_AMBULATORY_CARE_PROVIDER_SITE_OTHER): Payer: Self-pay | Admitting: Bariatrics

## 2018-10-04 ENCOUNTER — Ambulatory Visit (INDEPENDENT_AMBULATORY_CARE_PROVIDER_SITE_OTHER): Payer: BC Managed Care – PPO | Admitting: Bariatrics

## 2018-10-04 VITALS — BP 117/59 | HR 92 | Temp 98.4°F | Ht 65.0 in | Wt 226.0 lb

## 2018-10-04 DIAGNOSIS — E559 Vitamin D deficiency, unspecified: Secondary | ICD-10-CM

## 2018-10-04 DIAGNOSIS — Z9189 Other specified personal risk factors, not elsewhere classified: Secondary | ICD-10-CM | POA: Diagnosis not present

## 2018-10-04 DIAGNOSIS — F3289 Other specified depressive episodes: Secondary | ICD-10-CM

## 2018-10-04 DIAGNOSIS — Z6837 Body mass index (BMI) 37.0-37.9, adult: Secondary | ICD-10-CM

## 2018-10-04 DIAGNOSIS — R7303 Prediabetes: Secondary | ICD-10-CM

## 2018-10-04 MED ORDER — BUPROPION HCL ER (SR) 200 MG PO TB12: 200.0000 mg | ORAL_TABLET | Freq: Every day | ORAL | 0 refills | Status: DC

## 2018-10-04 MED ORDER — METFORMIN HCL 500 MG PO TABS: 500.0000 mg | ORAL_TABLET | Freq: Every day | ORAL | 0 refills | Status: DC

## 2018-10-04 MED ORDER — VITAMIN D (ERGOCALCIFEROL) 1.25 MG (50000 UNIT) PO CAPS: 50000.0000 [IU] | ORAL_CAPSULE | ORAL | 0 refills | Status: DC

## 2018-10-05 ENCOUNTER — Ambulatory Visit (INDEPENDENT_AMBULATORY_CARE_PROVIDER_SITE_OTHER): Payer: BC Managed Care – PPO | Admitting: Professional

## 2018-10-05 DIAGNOSIS — F331 Major depressive disorder, recurrent, moderate: Secondary | ICD-10-CM

## 2018-10-05 DIAGNOSIS — F411 Generalized anxiety disorder: Secondary | ICD-10-CM

## 2018-10-05 NOTE — Progress Notes (Signed)
Office: [phone]  /  Fax: [phone]   HPI:   Chief Complaint: OBESITY [name] is here to discuss her progress with her obesity treatment plan. She is on the  follow the Category 2 plan and is following her eating plan approximately 70 % of the time. She states she is exercising 0 minutes 0 times per week. [name] weight remains the same. She had been doing well awhile, but had over-the-weekend guests.  Her weight is 226 lb (102.5 kg) today and has not lost weight since her last visit. She has lost 8 lbs lbs since starting treatment with [place].  Pre-Diabetes [name] has a diagnosis of prediabetes based on her elevated HgA1c of 5.7 and Insulin of 33 and was informed this puts her at greater risk of developing diabetes. She is taking metformin currently and continues to work on diet and exercise to decrease risk of diabetes. She denies nausea, polyphagia, or hypoglycemia.   At risk for diabetes [name] is at higher than averagerisk for developing diabetes due to her obesity. She currently denies polyuria or polydipsia.  Vitamin D deficiency [name] has a diagnosis of vitamin D deficiency. She is currently taking vit D and denies nausea, vomiting or muscle weakness.  Depression with emotional eating behaviors [name] is struggling with emotional eating and using food for comfort to the extent that it is negatively impacting her health. She often snacks when she is not hungry. Graham sometimes feels she is out of control and then feels guilty that she made poor food choices. She has been working on behavior modification techniques to help reduce her emotional eating and has been somewhat successful. She shows no sign of suicidal or homicidal ideations.  Depression screen [facility] 2/9 05/18/2018 05/04/2018 04/20/2018 04/05/2018 03/16/2018  Decreased Interest 0 0 0 0 0  Down, Depressed, Hopeless 0 0 0 0 0  PHQ - 2 Score 0 0 0 0 0  Altered sleeping 0 0 0 0 0  Tired, decreased energy 1 0 0 0 0  Change in  appetite 1 0 0 0 0  Feeling bad or failure about yourself  0 0 0 0 0  Trouble concentrating 0 0 0 0 0  Moving slowly or fidgety/restless 0 0 0 0 0  Suicidal thoughts 0 0 0 0 0  PHQ-9 Score 2 0 0 0 0  Difficult doing work/chores - - - - -     ASSESSMENT AND PLAN:  Prediabetes - Plan: metFORMIN (GLUCOPHAGE) 500 MG tablet  Other depression - Plan: buPROPion (WELLBUTRIN SR) 200 MG 12 hr tablet  Vitamin D deficiency - Plan: Vitamin D, Ergocalciferol, (DRISDOL) 1.25 MG (50000 UT) CAPS capsule  At risk for diabetes mellitus  Class 2 severe obesity with serious comorbidity and body mass index (BMI) of 37.0 to 37.9 in adult, unspecified obesity type (HCC)  PLAN:  Pre-Diabetes [name] will continue to work on weight loss, exercise, and decreasing simple carbohydrates in her diet to help decrease the risk of diabetes. We dicussed metformin including benefits and risks. She was informed that eating too many simple carbohydrates or too many calories at one sitting increases the likelihood of GI side effects. Kattaleya agrees to continue Metformin 500 mg daily #30 with no refills. Latazia agreed to follow up with [place] as directed to monitor her progress.  Diabetes risk counseling Sereen was given extended (15 minutes) diabetes prevention counseling today. She is 38 y.o. female and has risk factors for diabetes including obesity. We discussed intensive lifestyle modifications today  with an emphasis on weight loss as well as increasing exercise and decreasing simple carbohydrates in her diet.  Vitamin D Deficiency [name] was informed that low vitamin D levels contributes to fatigue and are associated with obesity, breast, and colon cancer. She agrees to continue to take prescription Vit D @50 ,000 IU every 2 weeks #2 with no refills and will follow up for routine testing of vitamin D, at least 2-3 times per year. She was informed of the risk of over-replacement of vitamin D and agrees to not increase her dose  unless she discusses this with [place] first. Agrees to follow up with our clinic as directed.    Depression with Emotional Eating Behaviors We discussed behavior modification techniques today to help [name] deal with her emotional eating and depression. She has agreed to take Wellbutrin SR 200 mg qd #30 with no refills and agreed to follow up as directed.  Obesity [name] is currently in the action stage of change. As such, her goal is to continue with weight loss efforts She has agreed to follow the Category 2 plan [name] has been instructed to work up to a goal of 150 minutes of combined cardio and strengthening exercise per week for weight loss and overall health benefits. We discussed the following Behavioral Modification Strategies today:increase water intake, no skipping meals, keeping healthy foods in the home, planning for success,  increasing lean protein intake, decreasing simple carbohydrates , increasing vegetables, decrease eating out and work on meal planning and easy cooking plans   [name] has agreed to follow up with our clinic in 2 weeks. She was informed of the importance of frequent follow up visits to maximize her success with intensive lifestyle modifications for her multiple health conditions.  ALLERGIES: Allergies  Allergen Reactions  . Other     MEDICATIONS: Current Outpatient Medications on File Prior to Visit  Medication Sig Dispense Refill  . Cholecalciferol 1.25 MG (50000 UT) TABS 1 tablet weekly 4 tablet 0  . doxycycline (VIBRA-TABS) 100 MG tablet Take 1 tablet (100 mg total) by mouth 2 (two) times daily. 20 tablet 0  . ketoconazole (NIZORAL) 2 % shampoo Apply 1 application topically 2 (two) times a week.    . LO LOESTRIN FE 1 MG-10 MCG / 10 MCG tablet Take 28 mg by mouth daily.  11  . naproxen sodium (ANAPROX) 550 MG tablet Take 550 mg by mouth as needed.     No current facility-administered medications on file prior to visit.     PAST MEDICAL HISTORY: Past  Medical History:  Diagnosis Date  . Class 2 obesity due to excess calories with body mass index (BMI) of 35.0 to 35.9 in adult 02/08/2018  . GERD (gastroesophageal reflux disease)    not currently  . Gestational diabetes mellitus (GDM), antepartum   . Heart murmur   . HSV infection   . Leaky heart valve   . Prediabetes   . Vitamin D deficiency     PAST SURGICAL HISTORY: Past Surgical History:  Procedure Laterality Date  . CESAREAN SECTION    . CESAREAN SECTION N/A 04/21/2015   Procedure: CESAREAN SECTION;  Surgeon: Waymon Amato, MD;  Location: [facility];  Service: Obstetrics;  Laterality: N/A;  . DILATION AND EVACUATION N/A 08/09/2013   Procedure: DILATATION AND EVACUATION;  Surgeon: Betsy Coder, MD;  Location: [facility];  Service: Gynecology;  Laterality: N/A;  . excision of pilonidal cyst    . EXCISION OF SKIN TAG Right 08/09/2013   Procedure:  EXCISION OF SKIN TAG on right buttock;  Surgeon: Betsy Coder, MD;  Location: [facility];  Service: Gynecology;  Laterality: Right;  This procedure was added on after the patient had gone to sleep (as per Dr. Charlesetta Garibaldi)  . OPERATIVE ULTRASOUND N/A 08/09/2013   Procedure: OPERATIVE ULTRASOUND;  Surgeon: Betsy Coder, MD;  Location: [facility];  Service: Gynecology;  Laterality: N/A;  . TONSILLECTOMY      SOCIAL HISTORY: Social History   Tobacco Use  . Smoking status: Never Smoker  . Smokeless tobacco: Never Used  Substance Use Topics  . Alcohol use: Yes    Comment: rarely  . Drug use: No    FAMILY HISTORY: Family History  Problem Relation Age of Onset  . Hypertension Mother   . Sleep apnea Mother   . Obesity Mother   . Diabetes Father   . High Cholesterol Father   . Cancer Father   . Hypertension Brother   . Diabetes Brother     ROS: Review of Systems  Constitutional: Negative for weight loss.  Gastrointestinal: Negative for nausea and vomiting.  Musculoskeletal:       Negative for muscle weakness  Endo/Heme/Allergies: Negative for  polydipsia.       Negative for hypoglycemia  Negative for polyphagia  Negative for polyuria   Psychiatric/Behavioral: Positive for depression. Negative for suicidal ideas.    PHYSICAL EXAM: Blood pressure (!) 117/59, pulse 92, temperature 98.4 F (36.9 C), temperature source Oral, height 5\' 5"  (1.651 m), weight 226 lb (102.5 kg), last menstrual period 09/08/2018, SpO2 98 %, unknown if currently breastfeeding. Body mass index is 37.61 kg/m. Physical Exam Vitals signs reviewed.  Constitutional:      Appearance: Normal appearance. She is obese.  HENT:     Head: Normocephalic.     Nose: Nose normal.  Neck:     Musculoskeletal: Normal range of motion.  Cardiovascular:     Rate and Rhythm: Normal rate.  Pulmonary:     Effort: Pulmonary effort is normal.  Musculoskeletal: Normal range of motion.  Skin:    General: Skin is warm and dry.  Neurological:     Mental Status: She is alert and oriented to person, place, and time.  Psychiatric:        Mood and Affect: Mood normal.        Behavior: Behavior normal.     RECENT LABS AND TESTS: BMET    Component Value Date/Time   NA 137 08/30/2018 1349   K 4.6 08/30/2018 1349   CL 103 08/30/2018 1349   CO2 21 08/30/2018 1349   GLUCOSE 117 (H) 08/30/2018 1349   GLUCOSE 123 (H) 09/27/2017 0913   BUN 12 08/30/2018 1349   CREATININE 0.69 08/30/2018 1349   CREATININE 0.82 09/27/2017 0913   CALCIUM 9.3 08/30/2018 1349   GFRNONAA 111 08/30/2018 1349   GFRNONAA 91 09/27/2017 0913   GFRAA 128 08/30/2018 1349   GFRAA 106 09/27/2017 0913   Lab Results  Component Value Date   HGBA1C 5.7 (H) 08/30/2018   HGBA1C 5.8 (H) 12/27/2017   HGBA1C 6.0 (H) 09/27/2017   HGBA1C 5.9 (H) 09/30/2011   Lab Results  Component Value Date   INSULIN 33.0 (H) 08/30/2018   INSULIN 26.2 (H) 02/06/2018   CBC    Component Value Date/Time   WBC 6.4 10/02/2018 1101   RBC 4.11 10/02/2018 1101   HGB 11.8 10/02/2018 1101   HGB 12.0 02/06/2018 1201   HCT  35.4 10/02/2018 1101  HCT 37.2 02/06/2018 1201   PLT 360 10/02/2018 1101   MCV 86.1 10/02/2018 1101   MCV 85 02/06/2018 1201   MCH 28.7 10/02/2018 1101   MCHC 33.3 10/02/2018 1101   RDW 12.5 10/02/2018 1101   RDW 12.7 02/06/2018 1201   LYMPHSABS 1,926 10/02/2018 1101   LYMPHSABS 2.1 02/06/2018 1201   MONOABS 0.7 02/26/2014 1246   EOSABS 70 10/02/2018 1101   EOSABS 0.2 02/06/2018 1201   BASOSABS 32 10/02/2018 1101   BASOSABS 0.0 02/06/2018 1201   Iron/TIBC/Ferritin/ %Sat    Component Value Date/Time   IRON 54 09/30/2017 1607   TIBC 373 09/30/2017 1607   FERRITIN 81 09/30/2017 1607   IRONPCTSAT 14 (L) 09/30/2017 1607   Lipid Panel     Component Value Date/Time   CHOL 141 08/30/2018 1349   TRIG 65 08/30/2018 1349   HDL 48 08/30/2018 1349   CHOLHDL 3.4 09/27/2017 0913   LDLCALC 80 08/30/2018 1349   LDLCALC 88 09/27/2017 0913   Hepatic Function Panel     Component Value Date/Time   PROT 7.2 08/30/2018 1349   ALBUMIN 4.4 08/30/2018 1349   AST 8 08/30/2018 1349   ALT 8 08/30/2018 1349   ALKPHOS 46 08/30/2018 1349   BILITOT 0.3 08/30/2018 1349      Component Value Date/Time   TSH 1.870 09/13/2018 1355   TSH 1.400 02/06/2018 1201   TSH 1.61 09/27/2017 0913     Ref. Range 08/30/2018 13:49  Vitamin D, 25-Hydroxy Latest Ref Range: 30.0 - 100.0 ng/mL 43.9     OBESITY BEHAVIORAL INTERVENTION VISIT  Today's visit was # 9   Starting weight: 233 lbs Starting date: 02/06/18 Today's weight : Weight: 226 lb (102.5 kg)  Today's date: 10/04/2018 Total lbs lost to date: 8 lbs At least 15 minutes were spent on discussing the following behavioral intervention visit.   ASK: We discussed the diagnosis of obesity with [name] today and [name] agreed to give [place] permission to discuss obesity behavioral modification therapy today.  ASSESS: [name] has the diagnosis of obesity and her BMI today is 37.61 [name] is in the action stage of change   ADVISE: [name] was  educated on the multiple health risks of obesity as well as the benefit of weight loss to improve her health. She was advised of the need for long term treatment and the importance of lifestyle modifications to improve her current health and to decrease her risk of future health problems.  AGREE: Multiple dietary modification options and treatment options were discussed and  [name] agreed to follow the recommendations documented in the above note.  ARRANGE: [name] was educated on the importance of frequent visits to treat obesity as outlined per CMS and USPSTF guidelines and agreed to schedule her next follow up appointment today.  [name], am acting as transcriptionist for [employer], DO   I have reviewed the above documentation for accuracy and completeness, and I agree with the above. -Jearld Lesch, DO

## 2018-10-06 ENCOUNTER — Ambulatory Visit (INDEPENDENT_AMBULATORY_CARE_PROVIDER_SITE_OTHER): Payer: BC Managed Care – PPO | Admitting: Internal Medicine

## 2018-10-06 ENCOUNTER — Encounter: Payer: Self-pay | Admitting: Internal Medicine

## 2018-10-06 ENCOUNTER — Encounter: Payer: Self-pay | Admitting: Cardiology

## 2018-10-06 ENCOUNTER — Ambulatory Visit (INDEPENDENT_AMBULATORY_CARE_PROVIDER_SITE_OTHER): Payer: BC Managed Care – PPO | Admitting: Cardiology

## 2018-10-06 ENCOUNTER — Other Ambulatory Visit: Payer: Self-pay

## 2018-10-06 VITALS — BP 128/91 | HR 97 | Temp 97.6°F | Ht 65.0 in | Wt 228.0 lb

## 2018-10-06 VITALS — BP 120/80 | HR 76 | Ht 65.0 in | Wt 228.0 lb

## 2018-10-06 DIAGNOSIS — Z6837 Body mass index (BMI) 37.0-37.9, adult: Secondary | ICD-10-CM | POA: Diagnosis not present

## 2018-10-06 DIAGNOSIS — I071 Rheumatic tricuspid insufficiency: Secondary | ICD-10-CM

## 2018-10-06 DIAGNOSIS — Z0000 Encounter for general adult medical examination without abnormal findings: Secondary | ICD-10-CM

## 2018-10-06 DIAGNOSIS — R809 Proteinuria, unspecified: Secondary | ICD-10-CM | POA: Diagnosis not present

## 2018-10-06 DIAGNOSIS — R7303 Prediabetes: Secondary | ICD-10-CM | POA: Diagnosis not present

## 2018-10-06 DIAGNOSIS — I361 Nonrheumatic tricuspid (valve) insufficiency: Secondary | ICD-10-CM | POA: Diagnosis not present

## 2018-10-06 DIAGNOSIS — Z23 Encounter for immunization: Secondary | ICD-10-CM | POA: Diagnosis not present

## 2018-10-06 DIAGNOSIS — I272 Pulmonary hypertension, unspecified: Secondary | ICD-10-CM

## 2018-10-06 LAB — POCT URINALYSIS DIPSTICK
Appearance: NEGATIVE
Bilirubin, UA: NEGATIVE
Blood, UA: NEGATIVE
Glucose, UA: NEGATIVE
Ketones, UA: NEGATIVE
Leukocytes, UA: NEGATIVE
Nitrite, UA: NEGATIVE
Odor: NEGATIVE
Protein, UA: POSITIVE — AB
Spec Grav, UA: 1.02 (ref 1.010–1.025)
Urobilinogen, UA: 0.2 E.U./dL
pH, UA: 6.5 (ref 5.0–8.0)

## 2018-10-06 NOTE — Progress Notes (Signed)
Primary Physician:  Elby Showers, MD   Patient ID: [name], female    DOB: Nov 06, 1980, 38 y.o.   MRN: [id number]  Subjective:    Chief Complaint  Patient presents with  . tricuspid regurgitation    HPI: [name]  is a 38 y.o. female  with Vitamin D deficiency, gestational diabetes, mild to moderate tricuspid regurgitation and slightly elevated PA pressure by echocardiogram in Oct 2019 felt to be related to obesity.  She now presents for 6 month follow up. She is participating in the bariatric weight loss program at [facility], but unfortunately due to depression has had difficulty with weight loss. She has not lost any weight recently. She has been started on Wellbutrin.   She denies any recent episodes of shortness of breath on exertion, overall feeling well.  Does have some intermittent chest tightness associated with her anxiety.  Has not noticed this with exertion.  Patient is without history of hypertension or hyperlipidemia. No history of rheumatic fever. No history of tobacco use.  Past Medical History:  Diagnosis Date  . Class 2 obesity due to excess calories with body mass index (BMI) of 35.0 to 35.9 in adult 02/08/2018  . GERD (gastroesophageal reflux disease)    not currently  . Gestational diabetes mellitus (GDM), antepartum   . Heart murmur   . HSV infection   . Leaky heart valve   . Prediabetes   . Vitamin D deficiency     Past Surgical History:  Procedure Laterality Date  . CESAREAN SECTION    . CESAREAN SECTION N/A 04/21/2015   Procedure: CESAREAN SECTION;  Surgeon: Waymon Amato, MD;  Location: [facility];  Service: Obstetrics;  Laterality: N/A;  . DILATION AND EVACUATION N/A 08/09/2013   Procedure: DILATATION AND EVACUATION;  Surgeon: Betsy Coder, MD;  Location: [facility];  Service: Gynecology;  Laterality: N/A;  . excision of pilonidal cyst    . EXCISION OF SKIN TAG Right 08/09/2013   Procedure: EXCISION OF SKIN TAG on right buttock;  Surgeon: Betsy Coder, MD;  Location: [facility];  Service: Gynecology;  Laterality: Right;  This procedure was added on after the patient had gone to sleep (as per Dr. Charlesetta Garibaldi)  . OPERATIVE ULTRASOUND N/A 08/09/2013   Procedure: OPERATIVE ULTRASOUND;  Surgeon: Betsy Coder, MD;  Location: [facility];  Service: Gynecology;  Laterality: N/A;  . TONSILLECTOMY      Social History   Socioeconomic History  . Marital status: Married    Spouse name: Richard  . Number of children: 2  . Years of education: Not on file  . Highest education level: Bachelor's degree (e.g., BA, AB, BS)  Occupational History  . Occupation: Visual merchandiser  Social Needs  . Financial resource strain: Not on file  . Food insecurity    Worry: Not on file    Inability: Not on file  . Transportation needs    Medical: Not on file    Non-medical: Not on file  Tobacco Use  . Smoking status: Never Smoker  . Smokeless tobacco: Never Used  Substance and Sexual Activity  . Alcohol use: Yes    Comment: rarely  . Drug use: No  . Sexual activity: Yes    Birth control/protection: None  Lifestyle  . Physical activity    Days per week: Not on file    Minutes per session: Not on file  . Stress: Not on file  Relationships  . Social connections  Talks on phone: Not on file    Gets together: Not on file    Attends religious service: Not on file    Active member of club or organization: Not on file    Attends meetings of clubs or organizations: Not on file    Relationship status: Not on file  . Intimate partner violence    Fear of current or ex partner: Not on file    Emotionally abused: Not on file    Physically abused: Not on file    Forced sexual activity: Not on file  Other Topics Concern  . Not on file  Social History Narrative  . Not on file    Review of Systems  Constitution: Negative for decreased appetite, malaise/fatigue, weight gain and weight loss.  Eyes: Negative for visual disturbance.  Cardiovascular:  Negative for chest pain, claudication, dyspnea on exertion, leg swelling, orthopnea, palpitations and syncope.  Respiratory: Negative for hemoptysis and wheezing.   Endocrine: Negative for cold intolerance and heat intolerance.  Hematologic/Lymphatic: Does not bruise/bleed easily.  Skin: Negative for nail changes.  Musculoskeletal: Negative for muscle weakness and myalgias.  Gastrointestinal: Negative for abdominal pain, change in bowel habit, nausea and vomiting.  Neurological: Negative for difficulty with concentration, dizziness, focal weakness and headaches.  Psychiatric/Behavioral: Negative for altered mental status and suicidal ideas.  All other systems reviewed and are negative.     Objective:  Blood pressure (!) 128/91, pulse 97, temperature 97.6 F (36.4 C), height '5\' 5"'  (1.651 m), weight 228 lb (103.4 kg), last menstrual period 09/08/2018, SpO2 98 %, unknown if currently breastfeeding. Body mass index is 37.94 kg/m.    Physical Exam  Constitutional: She is oriented to person, place, and time. Vital signs are normal. She appears well-developed and well-nourished.  Moderately obese  HENT:  Head: Normocephalic and atraumatic.  Neck: Normal range of motion.  Cardiovascular: Normal rate, regular rhythm and intact distal pulses.  Murmur heard.  Early systolic murmur is present with a grade of 1/6 at the upper right sternal border. Pulmonary/Chest: Effort normal and breath sounds normal. No accessory muscle usage. No respiratory distress.  Abdominal: Soft. Bowel sounds are normal.  Musculoskeletal: Normal range of motion.  Neurological: She is alert and oriented to person, place, and time.  Skin: Skin is warm and dry.  Vitals reviewed.  Radiology: No results found.  Laboratory examination:   Labs 09/27/2017: Hemoglobin A1c 6%. Vitamin D low at 18. TSH 1.6. Cholesterol 149, triglycerides 81, HDL 44, LDL 88. Creatinine 0.82, EGFR 91/106, potassium 4.6, CMP normal. RBC 3.84,  hemoglobin 11.6, RDW 16.2%, CBC otherwise normal. Iron saturation 14%, iron panel otherwise normal.  CMP Latest Ref Rng & Units 08/30/2018 02/06/2018 09/27/2017  Glucose 65 - 99 mg/dL 117(H) 99 123(H)  BUN 6 - 20 mg/dL '12 11 13  ' Creatinine 0.57 - 1.00 mg/dL 0.69 0.61 0.82  Sodium 134 - 144 mmol/L 137 137 139  Potassium 3.5 - 5.2 mmol/L 4.6 4.6 4.6  Chloride 96 - 106 mmol/L 103 102 105  CO2 20 - 29 mmol/L '21 21 26  ' Calcium 8.7 - 10.2 mg/dL 9.3 9.7 9.7  Total Protein 6.0 - 8.5 g/dL 7.2 7.2 7.3  Total Bilirubin 0.0 - 1.2 mg/dL 0.3 0.3 0.3  Alkaline Phos 39 - 117 IU/L 46 52 -  AST 0 - 40 IU/L '8 11 13  ' ALT 0 - 32 IU/L '8 9 8   ' CBC Latest Ref Rng & Units 10/02/2018 02/06/2018 09/27/2017  WBC 3.8 - 10.8  Thousand/uL 6.4 7.1 7.0  Hemoglobin 11.7 - 15.5 g/dL 11.8 12.0 11.6(L)  Hematocrit 35.0 - 45.0 % 35.4 37.2 35.2  Platelets 140 - 400 Thousand/uL 360 - 367   Lipid Panel     Component Value Date/Time   CHOL 141 08/30/2018 1349   TRIG 65 08/30/2018 1349   HDL 48 08/30/2018 1349   CHOLHDL 3.4 09/27/2017 0913   LDLCALC 80 08/30/2018 1349   LDLCALC 88 09/27/2017 0913   HEMOGLOBIN A1C Lab Results  Component Value Date   HGBA1C 5.7 (H) 08/30/2018   MPG 120 12/27/2017   TSH Recent Labs    02/06/18 1201 09/13/18 1355  TSH 1.400 1.870    PRN Meds:. Medications Discontinued During This Encounter  Medication Reason  . Vitamin D, Ergocalciferol, (DRISDOL) 1.25 MG (50000 UT) CAPS capsule Error  . doxycycline (VIBRA-TABS) 100 MG tablet Error   Current Meds  Medication Sig  . buPROPion (WELLBUTRIN SR) 200 MG 12 hr tablet Take 1 tablet (200 mg total) by mouth daily.  . Cholecalciferol 1.25 MG (50000 UT) TABS 1 tablet weekly  . ketoconazole (NIZORAL) 2 % shampoo Apply 1 application topically 2 (two) times a week.  . LO LOESTRIN FE 1 MG-10 MCG / 10 MCG tablet Take 28 mg by mouth daily.  . metFORMIN (GLUCOPHAGE) 500 MG tablet Take 1 tablet (500 mg total) by mouth daily with breakfast.  .  Multiple Vitamin (MULTIVITAMIN WITH MINERALS) TABS tablet Take 1 tablet by mouth daily.  . naproxen sodium (ANAPROX) 550 MG tablet Take 550 mg by mouth as needed.    Cardiac Studies:   Echocardiogram 11/16/2017: Left ventricle cavity is normal in size. Normal global wall motion. Calculated EF 56%. Left atrial cavity is mildly dilated. Mild to moderate, eccentric tricuspid regurgitation. Estimated pulmonary artery systolic pressure 34 mmHg. IVC is normal with blunted respiratory response. Estimated RA pressure 8 mmHg. Murmur likely due to tricuspid regurgitation.  Assessment:   Pulmonary hypertension ([place]) - Plan: EKG 12-Lead  Nonrheumatic tricuspid valve regurgitation  Class 2 severe obesity due to excess calories with serious comorbidity and body mass index (BMI) of 37.0 to 37.9 in adult [facility])  EKG 10/06/2018: Sinus arrhythmia at 92 bpm, normal axis, no evidence of ischemia.  Normal EKG.  Recommendations:   Unfortunately, patient has not lost weight since last seen by me and is actually gained some weight due to the pandemic and also worsening depression/anxiety.  She is now on Wellbutrin and is closely working with weight loss center and hopefully this will improve.  She does admit to being very stressed with her job with working from home and also her daughter doing home schooling.  Encouraged her to take 30 minutes a day for break for her and her daughter and do some type of exercise or physical activity.  No changes are noted to physical exam.  Her blood pressure was slightly elevated today, but is generally very well controlled.  She will continue to monitor this.  She does have pulmonary hypertension per previous echocardiogram, likely secondary to her obesity.  I will hold off on repeating her echocardiogram until she has had some weight loss to see if this will improve.  I will see her back in 3 months for close follow-up, she is set a goal of losing 10 pounds before next office  visit.  Miquel Dunn, MSN, APRN, FNP-C [facility]. [place] Office: [phone] Fax: [phone]

## 2018-10-06 NOTE — Patient Instructions (Addendum)
It was a pleasure to see you today. Flu vaccine given.  Watch diet and get regular exercise.  We will collect 24-hour urine for protein.  May follow-up in 6 to 12 months.

## 2018-10-06 NOTE — Progress Notes (Signed)
Subjective:    Patient ID: [name], female    DOB: 1980-03-08, 38 y.o.   MRN: [id number]  HPI 38 year old for health maintenance exam and evaluation of medical issues.  Is followed by Dr. Einar Gip for murmur thought to be due to tricuspid regurgitation which on echo is mild to moderate.  She has a history of obesity and is seen at [facility].  Has not been able to lose weight recently.  Has been discouraged by that.  GYN is Dr. Charlesetta Garibaldi.  She has a history of impaired glucose tolerance, vitamin D deficiency.  In 2019 she weighed 244 pounds and currently weighs 228 pounds.  Family history of obesity in mother.  C-section 2010, a daughter.  Son born by C-section 2017.  History of gestational diabetes.  Miscarriage July 2015 with D&C.  Carpal tunnel surgery per Dr. Suszanne Conners, wisdom teeth extraction, tonsillectomy, history of 3 mm left ureteral stone in 2014.  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.  Married to a Engineer, structural.  Non-smoker.  Occasional alcohol consumption.  Family history: Mother with history of obesity and hypertension.  Brother with history of glucose intolerance/diabetes.       Review of Systems  Constitutional: Negative.   HENT: Negative.   Respiratory: Negative.   Cardiovascular: Negative.   Gastrointestinal: Negative.   Genitourinary: Negative.   Neurological: Negative.        Objective:   Physical Exam Vitals signs reviewed.  Constitutional:      Appearance: Normal appearance. She is not diaphoretic.  HENT:     Head: Normocephalic and atraumatic.     Right Ear: Tympanic membrane normal.     Left Ear: Tympanic membrane normal.     Nose: Nose normal.     Mouth/Throat:     Mouth: Mucous membranes are moist.     Pharynx: Oropharynx is clear.  Eyes:     General: No scleral icterus.       Right eye: No discharge.        Left eye: No discharge.     Conjunctiva/sclera: Conjunctivae  normal.     Pupils: Pupils are equal, round, and reactive to light.  Neck:     Musculoskeletal: Neck supple. No neck rigidity.  Cardiovascular:     Rate and Rhythm: Normal rate and regular rhythm.     Heart sounds: Normal heart sounds. No murmur.     Comments: 2/6 murmur identified Pulmonary:     Effort: Pulmonary effort is normal. No respiratory distress.     Breath sounds: Normal breath sounds.  Abdominal:     General: Bowel sounds are normal.     Palpations: Abdomen is soft. There is no mass.     Tenderness: There is no abdominal tenderness. There is no guarding.  Genitourinary:    Comments: Deferred to GYN Musculoskeletal:     Right lower leg: No edema.     Left lower leg: No edema.  Lymphadenopathy:     Cervical: No cervical adenopathy.  Skin:    General: Skin is warm and dry.     Findings: No rash.  Neurological:     General: No focal deficit present.     Mental Status: She is alert and oriented to person, place, and time.     Cranial Nerves: No cranial nerve deficit.     Gait: Gait normal.  Psychiatric:        Mood and Affect: Mood  normal.        Behavior: Behavior normal.        Thought Content: Thought content normal.        Judgment: Judgment normal.           Assessment & Plan:  History of mild to moderate tricuspid regurgitation followed by Dr. Einar Gip and stable  BMI 37-continue with healthy weight clinic.  Imperative for good health to lose weight.  Protein detected on urine dipstick.  She will collect 24-hour urine for protein.  Has microscopic proteinuria with microalbumin creatinine ratio of 72 normal being less than 30 mcg/mg.  Had readings of 75 last year.  This would be consistent with glucose intolerance I think.  Impaired glucose tolerance with hemoglobin A1c 5.7%  History of vitamin D deficiency-now corrected with supplements  Plan: Recommend following up with hemoglobin A1c and microalbumin in 6 months.  Dr. Leafy Ro will be monitoring the  hemoglobin A1c I believe.  Continue follow-up with Dr. Einar Gip as directed.  Diet exercise and weight loss suggested.  Flu vaccine given.

## 2018-10-07 LAB — MICROALBUMIN / CREATININE URINE RATIO
Creatinine, Urine: 155 mg/dL (ref 20–275)
Microalb Creat Ratio: 72 mcg/mg creat — ABNORMAL HIGH (ref <30)
Microalb, Ur: 11.2 mg/dL

## 2018-10-09 ENCOUNTER — Encounter (INDEPENDENT_AMBULATORY_CARE_PROVIDER_SITE_OTHER): Payer: Self-pay | Admitting: Bariatrics

## 2018-10-12 ENCOUNTER — Ambulatory Visit (INDEPENDENT_AMBULATORY_CARE_PROVIDER_SITE_OTHER): Payer: BC Managed Care – PPO | Admitting: Professional

## 2018-10-12 DIAGNOSIS — F411 Generalized anxiety disorder: Secondary | ICD-10-CM | POA: Diagnosis not present

## 2018-10-12 DIAGNOSIS — F331 Major depressive disorder, recurrent, moderate: Secondary | ICD-10-CM

## 2018-10-13 ENCOUNTER — Other Ambulatory Visit: Payer: Self-pay | Admitting: Internal Medicine

## 2018-10-13 DIAGNOSIS — R809 Proteinuria, unspecified: Secondary | ICD-10-CM

## 2018-10-14 LAB — PROTEIN, URINE, 24 HOUR: Protein, 24H Urine: 156 mg/24 h — ABNORMAL HIGH (ref 0–149)

## 2018-10-18 ENCOUNTER — Ambulatory Visit (INDEPENDENT_AMBULATORY_CARE_PROVIDER_SITE_OTHER): Payer: BC Managed Care – PPO | Admitting: Bariatrics

## 2018-10-18 ENCOUNTER — Other Ambulatory Visit: Payer: Self-pay

## 2018-10-18 ENCOUNTER — Encounter (INDEPENDENT_AMBULATORY_CARE_PROVIDER_SITE_OTHER): Payer: Self-pay | Admitting: Bariatrics

## 2018-10-18 VITALS — BP 105/70 | HR 83 | Temp 98.4°F | Ht 65.0 in | Wt 221.0 lb

## 2018-10-18 DIAGNOSIS — R7303 Prediabetes: Secondary | ICD-10-CM | POA: Diagnosis not present

## 2018-10-18 DIAGNOSIS — E559 Vitamin D deficiency, unspecified: Secondary | ICD-10-CM | POA: Diagnosis not present

## 2018-10-18 DIAGNOSIS — Z6836 Body mass index (BMI) 36.0-36.9, adult: Secondary | ICD-10-CM

## 2018-10-18 NOTE — Progress Notes (Signed)
Office: [phone]  /  Fax: [phone]   HPI:   Chief Complaint: OBESITY [name] is here to discuss her progress with her obesity treatment plan. She is on the Category 2 plan and is following her eating plan approximately 80% of the time. She states she is exercising 0 minutes 0 times per week. [name] is down 5 lbs and doing well overall. She states she is struggling with breakfast. Her weight is 221 lb (100.2 kg) today and has had a weight loss of 5 pounds over a period of 2 weeks since her last visit. She has lost 12 lbs since starting treatment with [place].  Pre-Diabetes [name] has a diagnosis of prediabetes based on her elevated Hgb A1c and was informed this puts her at greater risk of developing diabetes. Last A1c 5.7 on 08/30/2018 with an insulin of 33.0. She is taking metformin currently and continues to work on diet and exercise to decrease risk of diabetes. She denies nausea or hypoglycemia.  Vitamin D deficiency [name] has a diagnosis of Vitamin D deficiency. Last Vitamin D 43.9 on 08/30/2018. She is currently taking Vit D and denies nausea, vomiting or muscle weakness.  ASSESSMENT AND PLAN:  Prediabetes  Vitamin D deficiency  Class 2 severe obesity with serious comorbidity and body mass index (BMI) of 36.0 to 36.9 in adult, unspecified obesity type [facility])  PLAN:  Pre-Diabetes [name] will continue to work on weight loss, exercise, and decreasing simple carbohydrates in her diet to help decrease the risk of diabetes. We dicussed metformin including benefits and risks. She was informed that eating too many simple carbohydrates or too many calories at one sitting increases the likelihood of GI side effects. Kema will decrease carbohydrates, increase protein, and increase exercise/activity.  Vitamin D Deficiency [name] was informed that low Vitamin D levels contributes to fatigue and are associated with obesity, breast, and colon cancer. She agrees to continue taking Vit D  and will follow-up for routine testing of Vitamin D, at least 2-3 times per year. She was informed of the risk of over-replacement of Vitamin D and agrees to not increase her dose unless she discusses this with [place] first. [name] agrees to follow-up with our clinic in 2 weeks.  Obesity [name] is currently in the action stage of change. As such, her goal is to continue with weight loss efforts. She has agreed to follow the Category 2 plan. [name] will work on meal planning, intentional eating, and increasing her water intake. She was provided additional breakfast options. [name] has been instructed to continue walking (started back walking this week) for weight loss and overall health benefits. We discussed the following Behavioral Modification Strategies today: increasing lean protein intake, decreasing simple carbohydrates, increasing vegetables, increase H20 intake, decrease eating out, no skipping meals, work on meal planning and easy cooking plans, keeping healthy foods in the home, and planning for success.  [name] has agreed to follow up with our clinic in 2 weeks. She was informed of the importance of frequent follow up visits to maximize her success with intensive lifestyle modifications for her multiple health conditions.  ALLERGIES: No Active Allergies  MEDICATIONS: Current Outpatient Medications on File Prior to Visit  Medication Sig Dispense Refill  . buPROPion (WELLBUTRIN SR) 200 MG 12 hr tablet Take 1 tablet (200 mg total) by mouth daily. 30 tablet 0  . Cholecalciferol 1.25 MG (50000 UT) TABS 1 tablet weekly 4 tablet 0  . ketoconazole (NIZORAL) 2 % shampoo Apply 1 application topically 2 (two)  times a week.    . LO LOESTRIN FE 1 MG-10 MCG / 10 MCG tablet Take 28 mg by mouth daily.  11  . metFORMIN (GLUCOPHAGE) 500 MG tablet Take 1 tablet (500 mg total) by mouth daily with breakfast. 30 tablet 0  . Multiple Vitamin (MULTIVITAMIN WITH MINERALS) TABS tablet Take 1 tablet by mouth  daily.    . naproxen sodium (ANAPROX) 550 MG tablet Take 550 mg by mouth as needed.     No current facility-administered medications on file prior to visit.     PAST MEDICAL HISTORY: Past Medical History:  Diagnosis Date  . Class 2 obesity due to excess calories with body mass index (BMI) of 35.0 to 35.9 in adult 02/08/2018  . GERD (gastroesophageal reflux disease)    not currently  . Gestational diabetes mellitus (GDM), antepartum   . Heart murmur   . HSV infection   . Leaky heart valve   . Prediabetes   . Vitamin D deficiency     PAST SURGICAL HISTORY: Past Surgical History:  Procedure Laterality Date  . CESAREAN SECTION    . CESAREAN SECTION N/A 04/21/2015   Procedure: CESAREAN SECTION;  Surgeon: Waymon Amato, MD;  Location: [facility];  Service: Obstetrics;  Laterality: N/A;  . DILATION AND EVACUATION N/A 08/09/2013   Procedure: DILATATION AND EVACUATION;  Surgeon: Betsy Coder, MD;  Location: [facility];  Service: Gynecology;  Laterality: N/A;  . excision of pilonidal cyst    . EXCISION OF SKIN TAG Right 08/09/2013   Procedure: EXCISION OF SKIN TAG on right buttock;  Surgeon: Betsy Coder, MD;  Location: [facility];  Service: Gynecology;  Laterality: Right;  This procedure was added on after the patient had gone to sleep (as per Dr. Charlesetta Garibaldi)  . OPERATIVE ULTRASOUND N/A 08/09/2013   Procedure: OPERATIVE ULTRASOUND;  Surgeon: Betsy Coder, MD;  Location: [facility];  Service: Gynecology;  Laterality: N/A;  . TONSILLECTOMY      SOCIAL HISTORY: Social History   Tobacco Use  . Smoking status: Never Smoker  . Smokeless tobacco: Never Used  Substance Use Topics  . Alcohol use: Yes    Comment: rarely  . Drug use: No    FAMILY HISTORY: Family History  Problem Relation Age of Onset  . Hypertension Mother   . Sleep apnea Mother   . Obesity Mother   . Diabetes Father   . High Cholesterol Father   . Cancer Father   . Hypertension Brother   . Diabetes Brother    ROS: Review of  Systems  Gastrointestinal: Negative for nausea and vomiting.  Musculoskeletal:       Negative for muscle weakness.  Endo/Heme/Allergies:       Negative for hypoglycemia.   PHYSICAL EXAM: Blood pressure 105/70, pulse 83, temperature 98.4 F (36.9 C), temperature source Oral, height 5\' 5"  (1.651 m), weight 221 lb (100.2 kg), SpO2 98 %, unknown if currently breastfeeding. Body mass index is 36.78 kg/m. Physical Exam Vitals signs reviewed.  Constitutional:      Appearance: Normal appearance. She is obese.  Cardiovascular:     Rate and Rhythm: Normal rate.     Pulses: Normal pulses.  Pulmonary:     Effort: Pulmonary effort is normal.     Breath sounds: Normal breath sounds.  Musculoskeletal: Normal range of motion.  Skin:    General: Skin is warm and dry.  Neurological:     Mental Status: She is alert and oriented to person, place, and  time.  Psychiatric:        Behavior: Behavior normal.   RECENT LABS AND TESTS: BMET    Component Value Date/Time   NA 137 08/30/2018 1349   K 4.6 08/30/2018 1349   CL 103 08/30/2018 1349   CO2 21 08/30/2018 1349   GLUCOSE 117 (H) 08/30/2018 1349   GLUCOSE 123 (H) 09/27/2017 0913   BUN 12 08/30/2018 1349   CREATININE 0.69 08/30/2018 1349   CREATININE 0.82 09/27/2017 0913   CALCIUM 9.3 08/30/2018 1349   GFRNONAA 111 08/30/2018 1349   GFRNONAA 91 09/27/2017 0913   GFRAA 128 08/30/2018 1349   GFRAA 106 09/27/2017 0913   Lab Results  Component Value Date   HGBA1C 5.7 (H) 08/30/2018   HGBA1C 5.8 (H) 12/27/2017   HGBA1C 6.0 (H) 09/27/2017   HGBA1C 5.9 (H) 09/30/2011   Lab Results  Component Value Date   INSULIN 33.0 (H) 08/30/2018   INSULIN 26.2 (H) 02/06/2018   CBC    Component Value Date/Time   WBC 6.4 10/02/2018 1101   RBC 4.11 10/02/2018 1101   HGB 11.8 10/02/2018 1101   HGB 12.0 02/06/2018 1201   HCT 35.4 10/02/2018 1101   HCT 37.2 02/06/2018 1201   PLT 360 10/02/2018 1101   MCV 86.1 10/02/2018 1101   MCV 85 02/06/2018  1201   MCH 28.7 10/02/2018 1101   MCHC 33.3 10/02/2018 1101   RDW 12.5 10/02/2018 1101   RDW 12.7 02/06/2018 1201   LYMPHSABS 1,926 10/02/2018 1101   LYMPHSABS 2.1 02/06/2018 1201   MONOABS 0.7 02/26/2014 1246   EOSABS 70 10/02/2018 1101   EOSABS 0.2 02/06/2018 1201   BASOSABS 32 10/02/2018 1101   BASOSABS 0.0 02/06/2018 1201   Iron/TIBC/Ferritin/ %Sat    Component Value Date/Time   IRON 54 09/30/2017 1607   TIBC 373 09/30/2017 1607   FERRITIN 81 09/30/2017 1607   IRONPCTSAT 14 (L) 09/30/2017 1607   Lipid Panel     Component Value Date/Time   CHOL 141 08/30/2018 1349   TRIG 65 08/30/2018 1349   HDL 48 08/30/2018 1349   CHOLHDL 3.4 09/27/2017 0913   LDLCALC 80 08/30/2018 1349   LDLCALC 88 09/27/2017 0913   Hepatic Function Panel     Component Value Date/Time   PROT 7.2 08/30/2018 1349   ALBUMIN 4.4 08/30/2018 1349   AST 8 08/30/2018 1349   ALT 8 08/30/2018 1349   ALKPHOS 46 08/30/2018 1349   BILITOT 0.3 08/30/2018 1349      Component Value Date/Time   TSH 1.870 09/13/2018 1355   TSH 1.400 02/06/2018 1201   TSH 1.61 09/27/2017 0913   Results for SHIREE, Meyers (MRN [id number]) as of 10/18/2018 10:42  Ref. Range 08/30/2018 13:49  Vitamin D, 25-Hydroxy Latest Ref Range: 30.0 - 100.0 ng/mL 43.9   OBESITY BEHAVIORAL INTERVENTION VISIT  Today's visit was #10  Starting weight: 233 lbs Starting date: 02/06/2018 Today's weight: 221 lbs  Today's date: 10/18/2018 Total lbs lost to date: 12    10/18/2018  Height 5\' 5"  (1.651 m)  Weight 221 lb (100.2 kg)  BMI (Calculated) 36.78  BLOOD PRESSURE - SYSTOLIC [id number]  BLOOD PRESSURE - DIASTOLIC 70   Body Fat % [id number] %  Total Body Water (lbs) 83.8 lbs   ASK: We discussed the diagnosis of obesity with [name] today and [name] agreed to give [place] permission to discuss obesity behavioral modification therapy today.  ASSESS: [name] has the diagnosis of obesity and her BMI today is  36.8. Gregg is in the action stage  of change.   ADVISE: [name] was educated on the multiple health risks of obesity as well as the benefit of weight loss to improve her health. She was advised of the need for long term treatment and the importance of lifestyle modifications to improve her current health and to decrease her risk of future health problems.  AGREE: Multiple dietary modification options and treatment options were discussed and  [name] agreed to follow the recommendations documented in the above note.  ARRANGE: [name] was educated on the importance of frequent visits to treat obesity as outlined per CMS and USPSTF guidelines and agreed to schedule her next follow up appointment today.  Migdalia Dk, am acting as Location manager for [employer], DO  I have reviewed the above documentation for accuracy and completeness, and I agree with the above. -Jearld Lesch, DO

## 2018-10-19 ENCOUNTER — Ambulatory Visit (INDEPENDENT_AMBULATORY_CARE_PROVIDER_SITE_OTHER): Payer: BC Managed Care – PPO | Admitting: Professional

## 2018-10-19 DIAGNOSIS — F331 Major depressive disorder, recurrent, moderate: Secondary | ICD-10-CM

## 2018-10-19 DIAGNOSIS — F411 Generalized anxiety disorder: Secondary | ICD-10-CM

## 2018-10-26 ENCOUNTER — Ambulatory Visit (INDEPENDENT_AMBULATORY_CARE_PROVIDER_SITE_OTHER): Payer: BC Managed Care – PPO | Admitting: Professional

## 2018-10-26 DIAGNOSIS — F331 Major depressive disorder, recurrent, moderate: Secondary | ICD-10-CM

## 2018-10-26 DIAGNOSIS — F411 Generalized anxiety disorder: Secondary | ICD-10-CM | POA: Diagnosis not present

## 2018-11-01 ENCOUNTER — Encounter (INDEPENDENT_AMBULATORY_CARE_PROVIDER_SITE_OTHER): Payer: Self-pay | Admitting: Bariatrics

## 2018-11-01 ENCOUNTER — Ambulatory Visit (INDEPENDENT_AMBULATORY_CARE_PROVIDER_SITE_OTHER): Payer: BC Managed Care – PPO | Admitting: Bariatrics

## 2018-11-01 ENCOUNTER — Other Ambulatory Visit: Payer: Self-pay

## 2018-11-01 VITALS — BP 120/79 | HR 90 | Temp 98.9°F | Ht 65.0 in | Wt 222.0 lb

## 2018-11-01 DIAGNOSIS — R7303 Prediabetes: Secondary | ICD-10-CM | POA: Diagnosis not present

## 2018-11-01 DIAGNOSIS — E559 Vitamin D deficiency, unspecified: Secondary | ICD-10-CM | POA: Diagnosis not present

## 2018-11-01 DIAGNOSIS — Z9189 Other specified personal risk factors, not elsewhere classified: Secondary | ICD-10-CM

## 2018-11-01 DIAGNOSIS — F3289 Other specified depressive episodes: Secondary | ICD-10-CM

## 2018-11-01 DIAGNOSIS — Z6837 Body mass index (BMI) 37.0-37.9, adult: Secondary | ICD-10-CM

## 2018-11-01 MED ORDER — BUPROPION HCL ER (SR) 200 MG PO TB12: 200.0000 mg | ORAL_TABLET | Freq: Every day | ORAL | 0 refills | Status: DC

## 2018-11-02 ENCOUNTER — Ambulatory Visit: Payer: BC Managed Care – PPO | Admitting: Professional

## 2018-11-06 ENCOUNTER — Encounter (INDEPENDENT_AMBULATORY_CARE_PROVIDER_SITE_OTHER): Payer: Self-pay | Admitting: Bariatrics

## 2018-11-06 NOTE — Progress Notes (Signed)
Office: [phone]  /  Fax: [phone]   HPI:   Chief Complaint: OBESITY [name] is here to discuss her progress with her obesity treatment plan. She is on the Category 2 plan and is following her eating plan approximately 80% of the time. She states she is walking 15 minutes 4 times per week. [name] is up 1 lb. She reports struggling for breakfast.  Her weight is 222 lb (100.7 kg) today and has had a weight gain of 1 lb since her last visit. She has lost 11 lbs since starting treatment with [place].  Pre-Diabetes [name] has a diagnosis of prediabetes based on her elevated Hgb A1c and was informed this puts her at greater risk of developing diabetes. She continues to work on diet and exercise to decrease risk of diabetes. She denies nausea or hypoglycemia. No polyphagia.  At risk for diabetes [name] is at higher than average risk for developing diabetes due to her obesity. She currently denies polyuria or polydipsia.  Vitamin D deficiency [name] has a diagnosis of Vitamin D deficiency. She is currently taking Vit D and denies nausea, vomiting or muscle weakness.  Depression with emotional eating behaviors [name] is struggling with emotional eating and using food for comfort to the extent that it is negatively impacting her health. She often snacks when she is not hungry. [name] sometimes feels she is out of control and then feels guilty that she made poor food choices. She has been working on behavior modification techniques to help reduce her emotional eating and has been somewhat successful. She shows no sign of suicidal or homicidal ideations.  Depression screen [facility] 2/9 10/06/2018 05/18/2018 05/04/2018 04/20/2018 04/05/2018  Decreased Interest 0 0 0 0 0  Down, Depressed, Hopeless 0 0 0 0 0  PHQ - 2 Score 0 0 0 0 0  Altered sleeping - 0 0 0 0  Tired, decreased energy - 1 0 0 0  Change in appetite - 1 0 0 0  Feeling bad or failure about yourself  - 0 0 0 0  Trouble concentrating - 0 0 0 0   Moving slowly or fidgety/restless - 0 0 0 0  Suicidal thoughts - 0 0 0 0  PHQ-9 Score - 2 0 0 0  Difficult doing work/chores - - - - -   ASSESSMENT AND PLAN:  Prediabetes  Vitamin D deficiency  Other depression - with emotional eating  - Plan: buPROPion (WELLBUTRIN SR) 200 MG 12 hr tablet  At risk for diabetes mellitus  Class 2 severe obesity with serious comorbidity and body mass index (BMI) of 37.0 to 37.9 in adult, unspecified obesity type (HCC)  Other depression - Plan: buPROPion (WELLBUTRIN SR) 200 MG 12 hr tablet  PLAN:  Pre-Diabetes [name] will continue to work on weight loss, exercise, and decreasing simple carbohydrates in her diet to help decrease the risk of diabetes. We dicussed metformin including benefits and risks. She was informed that eating too many simple carbohydrates or too many calories at one sitting increases the likelihood of GI side effects. [name] was instructed to decrease carbohydrates, increase protein, and increase "healthy fats." She will follow-up with [place] as directed to monitor her progress.  Diabetes risk counseling [name] was given extended (15 minutes) diabetes prevention counseling today. She is 38 y.o. female and has risk factors for diabetes including obesity. We discussed intensive lifestyle modifications today with an emphasis on weight loss as well as increasing exercise and decreasing simple carbohydrates in her diet.  Vitamin  D Deficiency [name] was informed that low Vitamin D levels contributes to fatigue and are associated with obesity, breast, and colon cancer. She agrees to continue taking Vit D and will follow-up for routine testing of Vitamin D, at least 2-3 times per year. She was informed of the risk of over-replacement of Vitamin D and agrees to not increase her dose unless she discusses this with [place] first. [name] agrees to follow-up with our clinic in 2 weeks.  Depression with Emotional Eating Behaviors We discussed behavior  modification techniques today to help [name] deal with her emotional eating and depression. [name] was given a prescription for Wellbutrin 200 mg 1 PO daily #30 with 0 refills and agrees to follow-up with our clinic in 2 weeks.  Obesity [name] is currently in the action stage of change. As such, her goal is to continue with weight loss efforts. She has agreed to follow the Category 2 plan. [name] will work on meal planning, intentional eating, and increasing her water intake. [name] has been instructed to continue her current exercise regimen for weight loss and overall health benefits. We discussed the following Behavioral Modification Strategies today: increasing lean protein intake, decreasing simple carbohydrates, increasing vegetables, increase H20 intake, decrease eating out, no skipping meals, work on meal planning and easy cooking plans, keeping healthy foods in the home, and planning for success.  [name] has agreed to follow-up with our clinic in 2 weeks. She was informed of the importance of frequent follow-up visits to maximize her success with intensive lifestyle modifications for her multiple health conditions.  ALLERGIES: No Active Allergies  MEDICATIONS: Current Outpatient Medications on File Prior to Visit  Medication Sig Dispense Refill  . Cholecalciferol 1.25 MG (50000 UT) TABS 1 tablet weekly 4 tablet 0  . ketoconazole (NIZORAL) 2 % shampoo Apply 1 application topically 2 (two) times a week.    . LO LOESTRIN FE 1 MG-10 MCG / 10 MCG tablet Take 28 mg by mouth daily.  11  . metFORMIN (GLUCOPHAGE) 500 MG tablet Take 1 tablet (500 mg total) by mouth daily with breakfast. 30 tablet 0  . Multiple Vitamin (MULTIVITAMIN WITH MINERALS) TABS tablet Take 1 tablet by mouth daily.    . naproxen sodium (ANAPROX) 550 MG tablet Take 550 mg by mouth as needed.     No current facility-administered medications on file prior to visit.     PAST MEDICAL HISTORY: Past Medical History:   Diagnosis Date  . Class 2 obesity due to excess calories with body mass index (BMI) of 35.0 to 35.9 in adult 02/08/2018  . GERD (gastroesophageal reflux disease)    not currently  . Gestational diabetes mellitus (GDM), antepartum   . Heart murmur   . HSV infection   . Leaky heart valve   . Prediabetes   . Vitamin D deficiency     PAST SURGICAL HISTORY: Past Surgical History:  Procedure Laterality Date  . CESAREAN SECTION    . CESAREAN SECTION N/A 04/21/2015   Procedure: CESAREAN SECTION;  Surgeon: Waymon Amato, MD;  Location: [place] [facility];  Service: Obstetrics;  Laterality: N/A;  . DILATION AND EVACUATION N/A 08/09/2013   Procedure: DILATATION AND EVACUATION;  Surgeon: Betsy Coder, MD;  Location: [place] [facility];  Service: Gynecology;  Laterality: N/A;  . excision of pilonidal cyst    . EXCISION OF SKIN TAG Right 08/09/2013   Procedure: EXCISION OF SKIN TAG on right buttock;  Surgeon: Betsy Coder, MD;  Location: [place] [facility];  Service: Gynecology;  Laterality: Right;  This procedure was added on after the patient had gone to sleep (as per Dr. Charlesetta Garibaldi)  . OPERATIVE ULTRASOUND N/A 08/09/2013   Procedure: OPERATIVE ULTRASOUND;  Surgeon: Betsy Coder, MD;  Location: [place] [facility];  Service: Gynecology;  Laterality: N/A;  . TONSILLECTOMY      SOCIAL HISTORY: Social History   Tobacco Use  . Smoking status: Never Smoker  . Smokeless tobacco: Never Used  Substance Use Topics  . Alcohol use: Yes    Comment: rarely  . Drug use: No    FAMILY HISTORY: Family History  Problem Relation Age of Onset  . Hypertension Mother   . Sleep apnea Mother   . Obesity Mother   . Diabetes Father   . High Cholesterol Father   . Cancer Father   . Hypertension Brother   . Diabetes Brother    ROS: Review of Systems  Gastrointestinal: Negative for nausea and vomiting.  Musculoskeletal:       Negative for muscle weakness.  Endo/Heme/Allergies:       Negative for hypoglycemia. Negative for polyphagia.   Psychiatric/Behavioral: Positive for depression. Negative for suicidal ideas.       Negative for homicidal ideas.   PHYSICAL EXAM: Blood pressure 120/79, pulse 90, temperature 98.9 F (37.2 C), temperature source Oral, height 5\' 5"  (1.651 m), weight 222 lb (100.7 kg), last menstrual period 10/19/2018, SpO2 99 %, unknown if currently breastfeeding. Body mass index is 36.94 kg/m. Physical Exam Vitals signs reviewed.  Constitutional:      Appearance: Normal appearance. She is obese.  Cardiovascular:     Rate and Rhythm: Normal rate.     Pulses: Normal pulses.  Pulmonary:     Effort: Pulmonary effort is normal.     Breath sounds: Normal breath sounds.  Musculoskeletal: Normal range of motion.  Skin:    General: Skin is warm and dry.  Neurological:     Mental Status: She is alert and oriented to person, place, and time.  Psychiatric:        Behavior: Behavior normal.   RECENT LABS AND TESTS: BMET    Component Value Date/Time   NA 137 08/30/2018 1349   K 4.6 08/30/2018 1349   CL 103 08/30/2018 1349   CO2 21 08/30/2018 1349   GLUCOSE 117 (H) 08/30/2018 1349   GLUCOSE 123 (H) 09/27/2017 0913   BUN 12 08/30/2018 1349   CREATININE 0.69 08/30/2018 1349   CREATININE 0.82 09/27/2017 0913   CALCIUM 9.3 08/30/2018 1349   GFRNONAA 111 08/30/2018 1349   GFRNONAA 91 09/27/2017 0913   GFRAA 128 08/30/2018 1349   GFRAA 106 09/27/2017 0913   Lab Results  Component Value Date   HGBA1C 5.7 (H) 08/30/2018   HGBA1C 5.8 (H) 12/27/2017   HGBA1C 6.0 (H) 09/27/2017   HGBA1C 5.9 (H) 09/30/2011   Lab Results  Component Value Date   INSULIN 33.0 (H) 08/30/2018   INSULIN 26.2 (H) 02/06/2018   CBC    Component Value Date/Time   WBC 6.4 10/02/2018 1101   RBC 4.11 10/02/2018 1101   HGB 11.8 10/02/2018 1101   HGB 12.0 02/06/2018 1201   HCT 35.4 10/02/2018 1101   HCT 37.2 02/06/2018 1201   PLT 360 10/02/2018 1101   MCV 86.1 10/02/2018 1101   MCV 85 02/06/2018 1201   MCH 28.7  10/02/2018 1101   MCHC 33.3 10/02/2018 1101   RDW 12.5 10/02/2018 1101   RDW 12.7 02/06/2018 1201   LYMPHSABS 1,926 10/02/2018 1101  LYMPHSABS 2.1 02/06/2018 1201   MONOABS 0.7 02/26/2014 1246   EOSABS 70 10/02/2018 1101   EOSABS 0.2 02/06/2018 1201   BASOSABS 32 10/02/2018 1101   BASOSABS 0.0 02/06/2018 1201   Iron/TIBC/Ferritin/ %Sat    Component Value Date/Time   IRON 54 09/30/2017 1607   TIBC 373 09/30/2017 1607   FERRITIN 81 09/30/2017 1607   IRONPCTSAT 14 (L) 09/30/2017 1607   Lipid Panel     Component Value Date/Time   CHOL 141 08/30/2018 1349   TRIG 65 08/30/2018 1349   HDL 48 08/30/2018 1349   CHOLHDL 3.4 09/27/2017 0913   LDLCALC 80 08/30/2018 1349   LDLCALC 88 09/27/2017 0913   Hepatic Function Panel     Component Value Date/Time   PROT 7.2 08/30/2018 1349   ALBUMIN 4.4 08/30/2018 1349   AST 8 08/30/2018 1349   ALT 8 08/30/2018 1349   ALKPHOS 46 08/30/2018 1349   BILITOT 0.3 08/30/2018 1349      Component Value Date/Time   TSH 1.870 09/13/2018 1355   TSH 1.400 02/06/2018 1201   TSH 1.61 09/27/2017 0913   Results for LUNETTE, SAPON (MRN [id number]) as of 11/06/2018 10:01  Ref. Range 08/30/2018 13:49  Vitamin D, 25-Hydroxy Latest Ref Range: 30.0 - 100.0 ng/mL 43.9   OBESITY BEHAVIORAL INTERVENTION VISIT  Today's visit was #11   Starting weight: 233 lbs Starting date: 02/06/2018 Today's weight: 222 lbs  Today's date: 11/01/2018 Total lbs lost to date: 11    11/01/2018  Height 5\' 5"  (1.651 m)  Weight 222 lb (100.7 kg)  BMI (Calculated) 36.94  BLOOD PRESSURE - SYSTOLIC [id number]  BLOOD PRESSURE - DIASTOLIC 79   Body Fat % 44 %  Total Body Water (lbs) 84 lbs   ASK: We discussed the diagnosis of obesity with Richardean Sale today and Lorissa agreed to give [place] permission to discuss obesity behavioral modification therapy today.  ASSESS: Carren has the diagnosis of obesity and her BMI today is 37.0. Galylea is in the action stage of change.    ADVISE: Mitzy was educated on the multiple health risks of obesity as well as the benefit of weight loss to improve her health. She was advised of the need for long term treatment and the importance of lifestyle modifications to improve her current health and to decrease her risk of future health problems.  AGREE: Multiple dietary modification options and treatment options were discussed and  Aileana agreed to follow the recommendations documented in the above note.  ARRANGE: Dekeisha was educated on the importance of frequent visits to treat obesity as outlined per CMS and USPSTF guidelines and agreed to schedule her next follow up appointment today.  Migdalia Dk, am acting as Location manager for [employer], DO  I have reviewed the above documentation for accuracy and completeness, and I agree with the above. -Jearld Lesch, DO

## 2018-11-09 ENCOUNTER — Ambulatory Visit (INDEPENDENT_AMBULATORY_CARE_PROVIDER_SITE_OTHER): Payer: BC Managed Care – PPO | Admitting: Professional

## 2018-11-09 DIAGNOSIS — F331 Major depressive disorder, recurrent, moderate: Secondary | ICD-10-CM | POA: Diagnosis not present

## 2018-11-09 DIAGNOSIS — F411 Generalized anxiety disorder: Secondary | ICD-10-CM | POA: Diagnosis not present

## 2018-11-16 ENCOUNTER — Ambulatory Visit (INDEPENDENT_AMBULATORY_CARE_PROVIDER_SITE_OTHER): Payer: BC Managed Care – PPO | Admitting: Professional

## 2018-11-16 DIAGNOSIS — F331 Major depressive disorder, recurrent, moderate: Secondary | ICD-10-CM

## 2018-11-16 DIAGNOSIS — F411 Generalized anxiety disorder: Secondary | ICD-10-CM

## 2018-11-21 ENCOUNTER — Ambulatory Visit (INDEPENDENT_AMBULATORY_CARE_PROVIDER_SITE_OTHER): Payer: BC Managed Care – PPO | Admitting: Bariatrics

## 2018-11-23 ENCOUNTER — Other Ambulatory Visit: Payer: Self-pay

## 2018-11-23 ENCOUNTER — Ambulatory Visit (INDEPENDENT_AMBULATORY_CARE_PROVIDER_SITE_OTHER): Payer: BC Managed Care – PPO | Admitting: Physician Assistant

## 2018-11-23 ENCOUNTER — Ambulatory Visit (INDEPENDENT_AMBULATORY_CARE_PROVIDER_SITE_OTHER): Payer: BC Managed Care – PPO | Admitting: Professional

## 2018-11-23 ENCOUNTER — Encounter (INDEPENDENT_AMBULATORY_CARE_PROVIDER_SITE_OTHER): Payer: Self-pay | Admitting: Physician Assistant

## 2018-11-23 VITALS — BP 128/89 | HR 76 | Temp 98.0°F | Ht 65.0 in | Wt 217.0 lb

## 2018-11-23 DIAGNOSIS — F3289 Other specified depressive episodes: Secondary | ICD-10-CM

## 2018-11-23 DIAGNOSIS — F331 Major depressive disorder, recurrent, moderate: Secondary | ICD-10-CM

## 2018-11-23 DIAGNOSIS — Z6836 Body mass index (BMI) 36.0-36.9, adult: Secondary | ICD-10-CM

## 2018-11-23 DIAGNOSIS — F411 Generalized anxiety disorder: Secondary | ICD-10-CM

## 2018-11-23 DIAGNOSIS — R7303 Prediabetes: Secondary | ICD-10-CM | POA: Diagnosis not present

## 2018-11-23 DIAGNOSIS — Z9189 Other specified personal risk factors, not elsewhere classified: Secondary | ICD-10-CM

## 2018-11-23 MED ORDER — BUPROPION HCL ER (SR) 200 MG PO TB12: 200.0000 mg | ORAL_TABLET | Freq: Every day | ORAL | 0 refills | Status: DC

## 2018-11-27 NOTE — Progress Notes (Signed)
Office: [phone]  /  Fax: [phone]   HPI:   Chief Complaint: OBESITY [name] is here to discuss her progress with her obesity treatment plan. She is on the Category 2 plan and is following her eating plan approximately 95 % of the time. She states she is walking 15 to 30 minutes 7 times per week. Gustine did very well on the plan. She reports being bored with breakfast and she would like some additional options. She is also asking about chilis and soups for the fall. Her weight is 217 lb (98.4 kg) today and has had a weight loss of 5 pounds over a period of 3 weeks since her last visit. She has lost 16 lbs since starting treatment with [place].  Pre-Diabetes [name] has a diagnosis of prediabetes based on her elevated Hgb A1c and was informed this puts her at greater risk of developing diabetes. [name] is on metformin and she denies nausea, vomiting or diarrhea. She continues to work on diet and exercise to decrease risk of diabetes. She denies polyphagia.  At risk for diabetes [name] is at higher than average risk for developing diabetes due to her obesity and prediabetes. She currently denies polyuria or polydipsia.  Depression with emotional eating behaviors [name] is struggling with emotional eating and using food for comfort to the extent that it is negatively impacting her health. She often snacks when she is not hungry. [name] sometimes feels she is out of control and then feels guilty that she made poor food choices. She has been working on behavior modification techniques to help reduce her emotional eating and has been somewhat successful. [name] is on Wellburtin. She shows no sign of suicidal or homicidal ideations.  ASSESSMENT AND PLAN:  Prediabetes  Other depression - with emotional eating  - Plan: buPROPion (WELLBUTRIN SR) 200 MG 12 hr tablet  At risk for diabetes mellitus  Class 2 severe obesity with serious comorbidity and body mass index (BMI) of 36.0 to 36.9 in adult,  unspecified obesity type [facility])  PLAN:  Pre-Diabetes [name] will continue to work on weight loss, exercise, and decreasing simple carbohydrates in her diet to help decrease the risk of diabetes. We dicussed metformin including benefits and risks. She was informed that eating too many simple carbohydrates or too many calories at one sitting increases the likelihood of GI side effects. Quincee will continue metformin and follow up with [place] as directed to monitor her progress.  Diabetes risk counseling [name] was given extended (15 minutes) diabetes prevention counseling today. She is 38 y.o. female and has risk factors for diabetes including obesity and prediabetes. We discussed intensive lifestyle modifications today with an emphasis on weight loss as well as increasing exercise and decreasing simple carbohydrates in her diet.  Depression with Emotional Eating Behaviors We discussed behavior modification techniques today to help [name] deal with her emotional eating and depression. She has agreed to continue Wellbutrin SR 200 mg daily #30 with no refills and follow up as directed.  Obesity [name] is currently in the action stage of change. As such, her goal is to continue with weight loss efforts She has agreed to keep a food journal with 400 to 500 calories and 35 grams of protein at supper daily and follow the Category 2 plan [name] has been instructed to work up to a goal of 150 minutes of combined cardio and strengthening exercise per week for weight loss and overall health benefits. We discussed the following Behavioral Modification Strategies today: keeping healthy  foods in the home and work on meal planning and easy [name] has agreed to follow up with our clinic in 2 to 3 weeks. She was informed of the importance of frequent follow up visits to maximize her success with intensive lifestyle modifications for her multiple health conditions.  ALLERGIES: No Active  Allergies  MEDICATIONS: Current Outpatient Medications on File Prior to Visit  Medication Sig Dispense Refill   Cholecalciferol 1.25 MG (50000 UT) TABS 1 tablet weekly 4 tablet 0   ketoconazole (NIZORAL) 2 % shampoo Apply 1 application topically 2 (two) times a week.     LO LOESTRIN FE 1 MG-10 MCG / 10 MCG tablet Take 28 mg by mouth daily.  11   metFORMIN (GLUCOPHAGE) 500 MG tablet Take 1 tablet (500 mg total) by mouth daily with breakfast. 30 tablet 0   Multiple Vitamin (MULTIVITAMIN WITH MINERALS) TABS tablet Take 1 tablet by mouth daily.     naproxen sodium (ANAPROX) 550 MG tablet Take 550 mg by mouth as needed.     No current facility-administered medications on file prior to visit.     PAST MEDICAL HISTORY: Past Medical History:  Diagnosis Date   Class 2 obesity due to excess calories with body mass index (BMI) of 35.0 to 35.9 in adult 02/08/2018   GERD (gastroesophageal reflux disease)    not currently   Gestational diabetes mellitus (GDM), antepartum    Heart murmur    HSV infection    Leaky heart valve    Prediabetes    Vitamin D deficiency     PAST SURGICAL HISTORY: Past Surgical History:  Procedure Laterality Date   CESAREAN SECTION     CESAREAN SECTION N/A 04/21/2015   Procedure: CESAREAN SECTION;  Surgeon: Waymon Amato, MD;  Location: [place] [facility];  Service: Obstetrics;  Laterality: N/A;   DILATION AND EVACUATION N/A 08/09/2013   Procedure: DILATATION AND EVACUATION;  Surgeon: Betsy Coder, MD;  Location: [place] [facility];  Service: Gynecology;  Laterality: N/A;   excision of pilonidal cyst     EXCISION OF SKIN TAG Right 08/09/2013   Procedure: EXCISION OF SKIN TAG on right buttock;  Surgeon: Betsy Coder, MD;  Location: [place] [facility];  Service: Gynecology;  Laterality: Right;  This procedure was added on after the patient had gone to sleep (as per Dr. Charlesetta Garibaldi)   [place] N/A 08/09/2013   Procedure: OPERATIVE ULTRASOUND;  Surgeon: Betsy Coder, MD;   Location: [place] [facility];  Service: Gynecology;  Laterality: N/A;   TONSILLECTOMY      SOCIAL HISTORY: Social History   Tobacco Use   Smoking status: Never Smoker   Smokeless tobacco: Never Used  Substance Use Topics   Alcohol use: Yes    Comment: rarely   Drug use: No    FAMILY HISTORY: Family History  Problem Relation Age of Onset   Hypertension Mother    Sleep apnea Mother    Obesity Mother    Diabetes Father    High Cholesterol Father    Cancer Father    Hypertension Brother    Diabetes Brother     ROS: Review of Systems  Constitutional: Positive for weight loss.  Gastrointestinal: Negative for diarrhea, nausea and vomiting.  Genitourinary: Negative for frequency.  Endo/Heme/Allergies: Negative for polydipsia.       Negative for polyphagia  Psychiatric/Behavioral: Positive for depression. Negative for suicidal ideas.    PHYSICAL EXAM: Blood pressure 128/89, pulse 76, temperature 98 F (36.7 C), temperature source Oral,  height 5\' 5"  (1.651 m), weight 217 lb (98.4 kg), SpO2 98 %, unknown if currently breastfeeding. Body mass index is 36.11 kg/m. Physical Exam Vitals signs reviewed.  Constitutional:      Appearance: Normal appearance. She is well-developed. She is obese.  Cardiovascular:     Rate and Rhythm: Normal rate.  Pulmonary:     Effort: Pulmonary effort is normal.  Musculoskeletal: Normal range of motion.  Skin:    General: Skin is warm and dry.  Neurological:     Mental Status: She is alert and oriented to person, place, and time.  Psychiatric:        Mood and Affect: Mood normal.        Behavior: Behavior normal.        Thought Content: Thought content does not include homicidal or suicidal ideation.     RECENT LABS AND TESTS: BMET    Component Value Date/Time   NA 137 08/30/2018 1349   K 4.6 08/30/2018 1349   CL 103 08/30/2018 1349   CO2 21 08/30/2018 1349   GLUCOSE 117 (H) 08/30/2018 1349   GLUCOSE 123 (H) 09/27/2017 0913    BUN 12 08/30/2018 1349   CREATININE 0.69 08/30/2018 1349   CREATININE 0.82 09/27/2017 0913   CALCIUM 9.3 08/30/2018 1349   GFRNONAA 111 08/30/2018 1349   GFRNONAA 91 09/27/2017 0913   GFRAA 128 08/30/2018 1349   GFRAA 106 09/27/2017 0913   Lab Results  Component Value Date   HGBA1C 5.7 (H) 08/30/2018   HGBA1C 5.8 (H) 12/27/2017   HGBA1C 6.0 (H) 09/27/2017   HGBA1C 5.9 (H) 09/30/2011   Lab Results  Component Value Date   INSULIN 33.0 (H) 08/30/2018   INSULIN 26.2 (H) 02/06/2018   CBC    Component Value Date/Time   WBC 6.4 10/02/2018 1101   RBC 4.11 10/02/2018 1101   HGB 11.8 10/02/2018 1101   HGB 12.0 02/06/2018 1201   HCT 35.4 10/02/2018 1101   HCT 37.2 02/06/2018 1201   PLT 360 10/02/2018 1101   MCV 86.1 10/02/2018 1101   MCV 85 02/06/2018 1201   MCH 28.7 10/02/2018 1101   MCHC 33.3 10/02/2018 1101   RDW 12.5 10/02/2018 1101   RDW 12.7 02/06/2018 1201   LYMPHSABS 1,926 10/02/2018 1101   LYMPHSABS 2.1 02/06/2018 1201   MONOABS 0.7 02/26/2014 1246   EOSABS 70 10/02/2018 1101   EOSABS 0.2 02/06/2018 1201   BASOSABS 32 10/02/2018 1101   BASOSABS 0.0 02/06/2018 1201   Iron/TIBC/Ferritin/ %Sat    Component Value Date/Time   IRON 54 09/30/2017 1607   TIBC 373 09/30/2017 1607   FERRITIN 81 09/30/2017 1607   IRONPCTSAT 14 (L) 09/30/2017 1607   Lipid Panel     Component Value Date/Time   CHOL 141 08/30/2018 1349   TRIG 65 08/30/2018 1349   HDL 48 08/30/2018 1349   CHOLHDL 3.4 09/27/2017 0913   LDLCALC 80 08/30/2018 1349   LDLCALC 88 09/27/2017 0913   Hepatic Function Panel     Component Value Date/Time   PROT 7.2 08/30/2018 1349   ALBUMIN 4.4 08/30/2018 1349   AST 8 08/30/2018 1349   ALT 8 08/30/2018 1349   ALKPHOS 46 08/30/2018 1349   BILITOT 0.3 08/30/2018 1349      Component Value Date/Time   TSH 1.870 09/13/2018 1355   TSH 1.400 02/06/2018 1201   TSH 1.61 09/27/2017 0913     Ref. Range 08/30/2018 13:49  Vitamin D, 25-Hydroxy Latest Ref Range:  30.0 -  100.0 ng/mL 43.9    OBESITY BEHAVIORAL INTERVENTION VISIT  Today's visit was # 12   Starting weight: 233 lbs Starting date: 02/06/2018 Today's weight : 217 lbs Today's date: 11/23/2018 Total lbs lost to date: 16    11/23/2018  Height 5\' 5"  (1.651 m)  Weight 217 lb (98.4 kg)  BMI (Calculated) 36.11  BLOOD PRESSURE - SYSTOLIC 0000000  BLOOD PRESSURE - DIASTOLIC 89   Body Fat % [id number] %  Total Body Water (lbs) 83.8 lbs    ASK: We discussed the diagnosis of obesity with [name] today and [name] agreed to give [place] permission to discuss obesity behavioral modification therapy today.  ASSESS: Zniya has the diagnosis of obesity and her BMI today is 36.11 Jearlene is in the action stage of change   ADVISE: Naeema was educated on the multiple health risks of obesity as well as the benefit of weight loss to improve her health. She was advised of the need for long term treatment and the importance of lifestyle modifications to improve her current health and to decrease her risk of future health problems.  AGREE: Multiple dietary modification options and treatment options were discussed and  Trenidad agreed to follow the recommendations documented in the above note.  ARRANGE: Jaylean was educated on the importance of frequent visits to treat obesity as outlined per CMS and USPSTF guidelines and agreed to schedule her next follow up appointment today.  Corey Skains, am acting as transcriptionist for Abby Potash, PA-C I, Abby Potash, PA-C have reviewed above note and agree with its content

## 2018-12-18 ENCOUNTER — Encounter: Payer: Self-pay | Admitting: Internal Medicine

## 2018-12-18 ENCOUNTER — Other Ambulatory Visit: Payer: Self-pay

## 2018-12-18 ENCOUNTER — Ambulatory Visit (INDEPENDENT_AMBULATORY_CARE_PROVIDER_SITE_OTHER): Payer: BC Managed Care – PPO | Admitting: Physician Assistant

## 2018-12-18 VITALS — BP 108/72 | HR 85 | Temp 98.7°F | Ht 65.0 in | Wt 224.0 lb

## 2018-12-18 DIAGNOSIS — E559 Vitamin D deficiency, unspecified: Secondary | ICD-10-CM

## 2018-12-18 DIAGNOSIS — Z9189 Other specified personal risk factors, not elsewhere classified: Secondary | ICD-10-CM

## 2018-12-18 DIAGNOSIS — R7303 Prediabetes: Secondary | ICD-10-CM

## 2018-12-18 DIAGNOSIS — Z6837 Body mass index (BMI) 37.0-37.9, adult: Secondary | ICD-10-CM

## 2018-12-18 MED ORDER — METFORMIN HCL 500 MG PO TABS: 500.0000 mg | ORAL_TABLET | Freq: Every day | ORAL | 0 refills | Status: DC

## 2018-12-18 NOTE — Progress Notes (Signed)
Office: [phone]  /  Fax: [phone]   HPI:   Chief Complaint: OBESITY [name] is here to discuss her progress with her obesity treatment plan. She is on the Category 2 plan and is following her eating plan approximately 50% of the time. She states she is exercising 0 minutes 0 times per week. [name] reports that she was off of her plan the last 2 weeks due to Thanksgiving, her birthday, and her kids being home from school. She did not walk for exercise in that time. Her weight is 224 lb (101.6 kg) today and has had a weight gain of 7 lbs since her last visit. She has lost 9 lbs since starting treatment with [place].  Pre-Diabetes [name] has a diagnosis of prediabetes based on her elevated Hgb A1c and was informed this puts her at greater risk of developing diabetes. She is taking metformin currently and continues to work on diet and exercise to decrease risk of diabetes. She denies nausea, vomiting, or diarrhea. No polyphagia.  Vitamin D deficiency [name] has a diagnosis of Vitamin D deficiency. She is currently taking Vit D and denies nausea, vomiting or muscle weakness.  At risk for osteopenia and osteoporosis [name] is at higher risk of osteopenia and osteoporosis due to Vitamin D deficiency.   ASSESSMENT AND PLAN:  Prediabetes - Plan: metFORMIN (GLUCOPHAGE) 500 MG tablet  Vitamin D deficiency  At risk for osteoporosis  Class 2 severe obesity with serious comorbidity and body mass index (BMI) of 37.0 to 37.9 in adult, unspecified obesity type [name])  PLAN:  Pre-Diabetes [name] will continue to work on weight loss, exercise, and decreasing simple carbohydrates in her diet to help decrease the risk of diabetes. We dicussed metformin including benefits and risks. She was informed that eating too many simple carbohydrates or too many calories at one sitting increases the likelihood of GI side effects. [name] was given a refill on her metformin #30 with 0 refills. She agrees to  follow-up with our clinic in 2 weeks.  Vitamin D Deficiency [name] was informed that low Vitamin D levels contributes to fatigue and are associated with obesity, breast, and colon cancer. She agrees to continue taking Vit D and will follow-up for routine testing of Vitamin D, at least 2-3 times per year. She was informed of the risk of over-replacement of Vitamin D and agrees to not increase her dose unless she discusses this with [place] first. [name] agrees to follow-up with our clinic in 2 weeks.  At risk for osteopenia and osteoporosis [name] was given extended  (15 minutes) osteoporosis prevention counseling today. [name] is at risk for osteopenia and osteoporosis due to her Vitamin D deficiency. She was encouraged to take her Vitamin D and follow her higher calcium diet and increase strengthening exercise to help strengthen her bones and decrease her risk of osteopenia and osteoporosis.  Obesity [name] is currently in the action stage of change. As such, her goal is to continue with weight loss efforts. She has Meyers to follow the Category 2 plan. [name] has been instructed to work up to a goal of 150 minutes of combined cardio and strengthening exercise per week for weight loss and overall health benefits. We discussed the following Behavioral Modification Strategies today: work on meal planning and easy cooking plans, and keeping healthy foods in the home.  [name] to follow-up with our clinic in 2 weeks. She was informed of the importance of frequent follow-up visits to maximize her success with intensive  lifestyle modifications for her multiple health conditions.  ALLERGIES: No Active Allergies  MEDICATIONS: Current Outpatient Medications on File Prior to Visit  Medication Sig Dispense Refill   buPROPion (WELLBUTRIN SR) 200 MG 12 hr tablet Take 1 tablet (200 mg total) by mouth daily. 30 tablet 0   Cholecalciferol 1.25 MG (50000 UT) TABS 1 tablet weekly 4 tablet 0    ketoconazole (NIZORAL) 2 % shampoo Apply 1 application topically 2 (two) times a week.     LO LOESTRIN FE 1 MG-10 MCG / 10 MCG tablet Take 28 mg by mouth daily.  11   Multiple Vitamin (MULTIVITAMIN WITH MINERALS) TABS tablet Take 1 tablet by mouth daily.     naproxen sodium (ANAPROX) 550 MG tablet Take 550 mg by mouth as needed.     No current facility-administered medications on file prior to visit.     PAST MEDICAL HISTORY: Past Medical History:  Diagnosis Date   Class 2 obesity due to excess calories with body mass index (BMI) of 35.0 to 35.9 in adult 02/08/2018   GERD (gastroesophageal reflux disease)    not currently   Gestational diabetes mellitus (GDM), antepartum    Heart murmur    HSV infection    Leaky heart valve    Prediabetes    Vitamin D deficiency     PAST SURGICAL HISTORY: Past Surgical History:  Procedure Laterality Date   CESAREAN SECTION     CESAREAN SECTION Meyers 04/21/2015   Procedure: CESAREAN SECTION;  Surgeon: Waymon Amato, MD;  Location: [facility];  Service: Obstetrics;  Laterality: Meyers;   DILATION AND EVACUATION Meyers 08/09/2013   Procedure: DILATATION AND EVACUATION;  Surgeon: Betsy Coder, MD;  Location: [facility];  Service: Gynecology;  Laterality: Meyers;   excision of pilonidal cyst     EXCISION OF SKIN TAG Right 08/09/2013   Procedure: EXCISION OF SKIN TAG on right buttock;  Surgeon: Betsy Coder, MD;  Location: [facility];  Service: Gynecology;  Laterality: Right;  This procedure was added on after the patient had gone to sleep (as per Dr. Charlesetta Garibaldi)   [name] 08/09/2013   Procedure: OPERATIVE ULTRASOUND;  Surgeon: Betsy Coder, MD;  Location: [facility];  Service: Gynecology;  Laterality: Meyers;   TONSILLECTOMY      SOCIAL HISTORY: Social History   Tobacco Use   Smoking status: Never Smoker   Smokeless tobacco: Never Used  Substance Use Topics   Alcohol use: Yes    Comment: rarely   Drug use: No    FAMILY  HISTORY: Family History  Problem Relation Age of Onset   Hypertension Mother    Sleep apnea Mother    Obesity Mother    Diabetes Father    High Cholesterol Father    Cancer Father    Hypertension Brother    Diabetes Brother    ROS: Review of Systems  Gastrointestinal: Negative for diarrhea, nausea and vomiting.  Musculoskeletal:       Negative for muscle weakness.  Endo/Heme/Allergies:       Negative for polyphagia.   PHYSICAL EXAM: Blood pressure 108/72, pulse 85, temperature 98.7 F (37.1 C), height 5\' 5"  (1.651 m), weight 224 lb (101.6 kg), SpO2 98 %, unknown if currently breastfeeding. Body mass index is 37.28 kg/m. Physical Exam Vitals signs reviewed.  Constitutional:      Appearance: Normal appearance. She is obese.  Cardiovascular:     Rate and Rhythm: Normal rate.     Pulses: Normal pulses.  Pulmonary:     Effort: Pulmonary effort is normal.     Breath sounds: Normal breath sounds.  Musculoskeletal: Normal range of motion.  Skin:    General: Skin is warm and dry.  Neurological:     Mental Status: She is alert and oriented to person, place, and time.  Psychiatric:        Behavior: Behavior normal.   RECENT LABS AND TESTS: BMET    Component Value Date/Time   NA 137 08/30/2018 1349   K 4.6 08/30/2018 1349   CL 103 08/30/2018 1349   CO2 21 08/30/2018 1349   GLUCOSE 117 (H) 08/30/2018 1349   GLUCOSE 123 (H) 09/27/2017 0913   BUN 12 08/30/2018 1349   CREATININE 0.69 08/30/2018 1349   CREATININE 0.82 09/27/2017 0913   CALCIUM 9.3 08/30/2018 1349   GFRNONAA 111 08/30/2018 1349   GFRNONAA 91 09/27/2017 0913   GFRAA 128 08/30/2018 1349   GFRAA 106 09/27/2017 0913   Lab Results  Component Value Date   HGBA1C 5.7 (H) 08/30/2018   HGBA1C 5.8 (H) 12/27/2017   HGBA1C 6.0 (H) 09/27/2017   HGBA1C 5.9 (H) 09/30/2011   Lab Results  Component Value Date   INSULIN 33.0 (H) 08/30/2018   INSULIN 26.2 (H) 02/06/2018   CBC    Component Value  Date/Time   WBC 6.4 10/02/2018 1101   RBC 4.11 10/02/2018 1101   HGB 11.8 10/02/2018 1101   HGB 12.0 02/06/2018 1201   HCT 35.4 10/02/2018 1101   HCT 37.2 02/06/2018 1201   PLT 360 10/02/2018 1101   MCV 86.1 10/02/2018 1101   MCV 85 02/06/2018 1201   MCH 28.7 10/02/2018 1101   MCHC 33.3 10/02/2018 1101   RDW 12.5 10/02/2018 1101   RDW 12.7 02/06/2018 1201   LYMPHSABS 1,926 10/02/2018 1101   LYMPHSABS 2.1 02/06/2018 1201   MONOABS 0.7 02/26/2014 1246   EOSABS 70 10/02/2018 1101   EOSABS 0.2 02/06/2018 1201   BASOSABS 32 10/02/2018 1101   BASOSABS 0.0 02/06/2018 1201   Iron/TIBC/Ferritin/ %Sat    Component Value Date/Time   IRON 54 09/30/2017 1607   TIBC 373 09/30/2017 1607   FERRITIN 81 09/30/2017 1607   IRONPCTSAT 14 (L) 09/30/2017 1607   Lipid Panel     Component Value Date/Time   CHOL 141 08/30/2018 1349   TRIG 65 08/30/2018 1349   HDL 48 08/30/2018 1349   CHOLHDL 3.4 09/27/2017 0913   LDLCALC 80 08/30/2018 1349   LDLCALC 88 09/27/2017 0913   Hepatic Function Panel     Component Value Date/Time   PROT 7.2 08/30/2018 1349   ALBUMIN 4.4 08/30/2018 1349   AST 8 08/30/2018 1349   ALT 8 08/30/2018 1349   ALKPHOS 46 08/30/2018 1349   BILITOT 0.3 08/30/2018 1349      Component Value Date/Time   TSH 1.870 09/13/2018 1355   TSH 1.400 02/06/2018 1201   TSH 1.61 09/27/2017 0913   Results for MADDELYNN, DIPPOLITO (MRN [id number]) as of 12/18/2018 10:50  Ref. Range 08/30/2018 13:49  Vitamin D, 25-Hydroxy Latest Ref Range: 30.0 - 100.0 ng/mL 43.9   OBESITY BEHAVIORAL INTERVENTION VISIT  Today's visit was #13  Starting weight: 233 lbs Starting date: 02/06/2018 Today's weight: 224 lbs  Today's date: 12/18/2018 Total lbs lost to date: 9     12/18/2018  Height 5\' 5"  (1.651 m)  Weight 224 lb (101.6 kg)  BMI (Calculated) 37.28  BLOOD PRESSURE - SYSTOLIC [id number]  BLOOD PRESSURE - DIASTOLIC 72  Body Fat % 46.1 %  Total Body Water (lbs) 89 lbs   ASK: We discussed the  diagnosis of obesity with [name] today and [name] Meyers to give [place] permission to discuss obesity behavioral modification therapy today.  ASSESS: [name] has the diagnosis of obesity and her BMI today is 37.3. Tamasha is in the action stage of change.   ADVISE: [name] was educated on the multiple health risks of obesity as well as the benefit of weight loss to improve her health. She was advised of the need for long term treatment and the importance of lifestyle modifications to improve her current health and to decrease her risk of future health problems.  AGREE: Multiple dietary modification options and treatment options were discussed and  [name] Meyers to follow the recommendations documented in the above note.  ARRANGE: [name] was educated on the importance of frequent visits to treat obesity as outlined per CMS and USPSTF guidelines and Meyers to schedule her next follow up appointment today.  [name], am acting as transcriptionist for Abby Potash, PA-C I, Abby Potash, PA-C have reviewed above note and agree with its content

## 2018-12-21 ENCOUNTER — Ambulatory Visit: Payer: BC Managed Care – PPO | Admitting: Professional

## 2019-01-02 ENCOUNTER — Encounter (INDEPENDENT_AMBULATORY_CARE_PROVIDER_SITE_OTHER): Payer: Self-pay

## 2019-01-03 ENCOUNTER — Other Ambulatory Visit: Payer: Self-pay

## 2019-01-03 ENCOUNTER — Encounter (INDEPENDENT_AMBULATORY_CARE_PROVIDER_SITE_OTHER): Payer: Self-pay | Admitting: Physician Assistant

## 2019-01-03 ENCOUNTER — Telehealth (INDEPENDENT_AMBULATORY_CARE_PROVIDER_SITE_OTHER): Payer: BC Managed Care – PPO | Admitting: Physician Assistant

## 2019-01-03 DIAGNOSIS — E559 Vitamin D deficiency, unspecified: Secondary | ICD-10-CM | POA: Diagnosis not present

## 2019-01-03 DIAGNOSIS — Z6837 Body mass index (BMI) 37.0-37.9, adult: Secondary | ICD-10-CM

## 2019-01-03 DIAGNOSIS — F3289 Other specified depressive episodes: Secondary | ICD-10-CM | POA: Diagnosis not present

## 2019-01-03 MED ORDER — CHOLECALCIFEROL 1.25 MG (50000 UT) PO TABS: ORAL_TABLET | ORAL | 0 refills | Status: DC

## 2019-01-03 MED ORDER — BUPROPION HCL ER (SR) 200 MG PO TB12: 200.0000 mg | ORAL_TABLET | Freq: Every day | ORAL | 0 refills | Status: DC

## 2019-01-04 ENCOUNTER — Ambulatory Visit (INDEPENDENT_AMBULATORY_CARE_PROVIDER_SITE_OTHER): Payer: BC Managed Care – PPO | Admitting: Professional

## 2019-01-04 DIAGNOSIS — F411 Generalized anxiety disorder: Secondary | ICD-10-CM | POA: Diagnosis not present

## 2019-01-04 DIAGNOSIS — F331 Major depressive disorder, recurrent, moderate: Secondary | ICD-10-CM

## 2019-01-05 ENCOUNTER — Ambulatory Visit: Payer: BC Managed Care – PPO | Admitting: Cardiology

## 2019-01-08 NOTE — Progress Notes (Signed)
Office: [phone]  /  Fax: [phone] TeleHealth Visit:  [name] has verbally consented to this TeleHealth visit today. The patient is located at home, the provider is located at the [facility] office. The participants in this visit include the listed provider and patient and any and all parties involved. The visit was conducted today via FaceTime.  HPI:  Chief Complaint: OBESITY [name] is here to discuss her progress with her obesity treatment plan. She is on the Category 2 plan and states she is following her eating plan approximately 85 to 90 % of the time. She states she is walking 15 to 20 minutes 3 times per week.  Suad's most recent weight is at 219 pounds (01/03/19). She is bored with breakfast and she is looking for other options. She reports doing much better on the plan the last few weeks and she has been able to refocus.  Vitamin D deficiency [name] has a diagnosis of vitamin D deficiency. She has no nausea, vomiting or muscle weakness on vitamin D. [name] is due for labs, and she will have them drawn at the next visit.  Depression with emotional eating behaviors [name] struggles with emotional eating and she states Wellbutrin is helping with cravings. She has no suicidal or homicidal ideations.    ASSESSMENT AND PLAN:  Vitamin D deficiency  Other depression - Plan: buPROPion (WELLBUTRIN SR) 200 MG 12 hr tablet  Class 2 severe obesity with serious comorbidity and body mass index (BMI) of 37.0 to 37.9 in adult, unspecified obesity type ([place])  Other depression - with emotional eating  - Plan: buPROPion (WELLBUTRIN SR) 200 MG 12 hr tablet  PLAN:  Vitamin D Deficiency Low vitamin D level contributes to fatigue and are associated with obesity, breast, and colon cancer. [name] agrees to continue to take prescription Vit D @50 ,000 IU every week #4 with no refills and she will follow up for routine testing of vitamin D, at least 2-3 times per year  to avoid over-replacement. [name] agrees to follow up with our clinic in 3 weeks.  Emotional Eating Behaviors (other depression) Behavior modification techniques were discussed today to help [name] deal with her emotional/non-hunger eating behaviors. [name] agrees to continue Wellbutrin 200 mg daily #30 with no refills and follow up as directed. We will continue to follow and monitor her progress.  Obesity [name] is currently in the action stage of change. As such, her goal is to continue with weight loss efforts She has agreed to keep a food journal with 200 to 300 calories and 20 grams of protein at breakfast daily and follow the Category 2 plan [name] has been instructed to work up to a goal of 150 minutes of combined cardio and strengthening exercise per week for weight loss and overall health benefits. We discussed the following Behavioral Modification Strategies today: better snacking choices and emotional eating strategies  [name] has agreed to follow up with our clinic in 3 weeks. She was informed of the importance of frequent follow up visits to maximize her success with intensive lifestyle modifications for her multiple health conditions.  I spent > than 50% of the 27 minute visit on counseling as documented in the note.    ALLERGIES: No Active Allergies  MEDICATIONS: Current Outpatient Medications on File Prior to Visit  Medication Sig Dispense Refill  . ketoconazole (NIZORAL) 2 % shampoo Apply 1 application topically 2 (two) times a week.    . LO LOESTRIN FE 1 MG-10 MCG /  10 MCG tablet Take 28 mg by mouth daily.  11  . metFORMIN (GLUCOPHAGE) 500 MG tablet Take 1 tablet (500 mg total) by mouth daily with breakfast. 30 tablet 0  . Multiple Vitamin (MULTIVITAMIN WITH MINERALS) TABS tablet Take 1 tablet by mouth daily.    . naproxen sodium (ANAPROX) 550 MG tablet Take 550 mg by mouth as needed.     No current facility-administered medications on file prior to visit.    PAST  MEDICAL HISTORY: Past Medical History:  Diagnosis Date  . Class 2 obesity due to excess calories with body mass index (BMI) of 35.0 to 35.9 in adult 02/08/2018  . GERD (gastroesophageal reflux disease)    not currently  . Gestational diabetes mellitus (GDM), antepartum   . Heart murmur   . HSV infection   . Leaky heart valve   . Prediabetes   . Vitamin D deficiency     PAST SURGICAL HISTORY: Past Surgical History:  Procedure Laterality Date  . CESAREAN SECTION    . CESAREAN SECTION N/A 04/21/2015   Procedure: CESAREAN SECTION;  Surgeon: Waymon Amato, MD;  Location: [facility];  Service: Obstetrics;  Laterality: N/A;  . DILATION AND EVACUATION N/A 08/09/2013   Procedure: DILATATION AND EVACUATION;  Surgeon: Betsy Coder, MD;  Location: [facility];  Service: Gynecology;  Laterality: N/A;  . excision of pilonidal cyst    . EXCISION OF SKIN TAG Right 08/09/2013   Procedure: EXCISION OF SKIN TAG on right buttock;  Surgeon: Betsy Coder, MD;  Location: [facility];  Service: Gynecology;  Laterality: Right;  This procedure was added on after the patient had gone to sleep (as per Dr. Charlesetta Garibaldi)  . OPERATIVE ULTRASOUND N/A 08/09/2013   Procedure: OPERATIVE ULTRASOUND;  Surgeon: Betsy Coder, MD;  Location: [facility];  Service: Gynecology;  Laterality: N/A;  . TONSILLECTOMY      SOCIAL HISTORY: Social History   Tobacco Use  . Smoking status: Never Smoker  . Smokeless tobacco: Never Used  Substance Use Topics  . Alcohol use: Yes    Comment: rarely  . Drug use: No    FAMILY HISTORY: Family History  Problem Relation Age of Onset  . Hypertension Mother   . Sleep apnea Mother   . Obesity Mother   . Diabetes Father   . High Cholesterol Father   . Cancer Father   . Hypertension Brother   . Diabetes Brother     ROS: Review of Systems  Constitutional: Positive for weight loss.  Gastrointestinal: Negative for nausea and vomiting.  Musculoskeletal:       Negative for muscle weakness    Endo/Heme/Allergies:       Negative for cravings  Psychiatric/Behavioral: Positive for depression. Negative for suicidal ideas.    PHYSICAL EXAM: unknown if currently breastfeeding. There is no height or weight on file to calculate BMI. Physical Exam Vitals reviewed.  Constitutional:      General: She is not in acute distress.    Appearance: Normal appearance. She is well-developed. She is obese.  Cardiovascular:     Rate and Rhythm: Normal rate.  Pulmonary:     Effort: Pulmonary effort is normal.  Musculoskeletal:        General: Normal range of motion.  Skin:    General: Skin is warm and dry.  Neurological:     Mental Status: She is alert and oriented to person, place, and time.  Psychiatric:        Mood and Affect: Mood  normal.        Behavior: Behavior normal.        Thought Content: Thought content does not include homicidal or suicidal ideation.     RECENT LABS AND TESTS: BMET    Component Value Date/Time   NA 137 08/30/2018 1349   K 4.6 08/30/2018 1349   CL 103 08/30/2018 1349   CO2 21 08/30/2018 1349   GLUCOSE 117 (H) 08/30/2018 1349   GLUCOSE 123 (H) 09/27/2017 0913   BUN 12 08/30/2018 1349   CREATININE 0.69 08/30/2018 1349   CREATININE 0.82 09/27/2017 0913   CALCIUM 9.3 08/30/2018 1349   GFRNONAA 111 08/30/2018 1349   GFRNONAA 91 09/27/2017 0913   GFRAA 128 08/30/2018 1349   GFRAA 106 09/27/2017 0913   Lab Results  Component Value Date   HGBA1C 5.7 (H) 08/30/2018   HGBA1C 5.8 (H) 12/27/2017   HGBA1C 6.0 (H) 09/27/2017   HGBA1C 5.9 (H) 09/30/2011   Lab Results  Component Value Date   INSULIN 33.0 (H) 08/30/2018   INSULIN 26.2 (H) 02/06/2018   CBC    Component Value Date/Time   WBC 6.4 10/02/2018 1101   RBC 4.11 10/02/2018 1101   HGB 11.8 10/02/2018 1101   HGB 12.0 02/06/2018 1201   HCT 35.4 10/02/2018 1101   HCT 37.2 02/06/2018 1201   PLT 360 10/02/2018 1101   MCV 86.1 10/02/2018 1101   MCV 85 02/06/2018 1201   MCH 28.7 10/02/2018  1101   MCHC 33.3 10/02/2018 1101   RDW 12.5 10/02/2018 1101   RDW 12.7 02/06/2018 1201   LYMPHSABS 1,926 10/02/2018 1101   LYMPHSABS 2.1 02/06/2018 1201   MONOABS 0.7 02/26/2014 1246   EOSABS 70 10/02/2018 1101   EOSABS 0.2 02/06/2018 1201   BASOSABS 32 10/02/2018 1101   BASOSABS 0.0 02/06/2018 1201   Iron/TIBC/Ferritin/ %Sat    Component Value Date/Time   IRON 54 09/30/2017 1607   TIBC 373 09/30/2017 1607   FERRITIN 81 09/30/2017 1607   IRONPCTSAT 14 (L) 09/30/2017 1607   Lipid Panel     Component Value Date/Time   CHOL 141 08/30/2018 1349   TRIG 65 08/30/2018 1349   HDL 48 08/30/2018 1349   CHOLHDL 3.4 09/27/2017 0913   LDLCALC 80 08/30/2018 1349   LDLCALC 88 09/27/2017 0913   Hepatic Function Panel     Component Value Date/Time   PROT 7.2 08/30/2018 1349   ALBUMIN 4.4 08/30/2018 1349   AST 8 08/30/2018 1349   ALT 8 08/30/2018 1349   ALKPHOS 46 08/30/2018 1349   BILITOT 0.3 08/30/2018 1349      Component Value Date/Time   TSH 1.870 09/13/2018 1355   TSH 1.400 02/06/2018 1201   TSH 1.61 09/27/2017 0913     Ref. Range 08/30/2018 13:49  Vitamin D, 25-Hydroxy Latest Ref Range: 30.0 - 100.0 ng/mL 43.9    I, Doreene Nest, am acting as Location manager for Abby Potash, PA-C I, Abby Potash, PA-C have reviewed above note and agree with its content

## 2019-01-15 ENCOUNTER — Ambulatory Visit: Payer: BC Managed Care – PPO | Admitting: Cardiology

## 2019-01-25 ENCOUNTER — Ambulatory Visit (INDEPENDENT_AMBULATORY_CARE_PROVIDER_SITE_OTHER): Payer: BC Managed Care – PPO | Admitting: Professional

## 2019-01-25 DIAGNOSIS — F411 Generalized anxiety disorder: Secondary | ICD-10-CM | POA: Diagnosis not present

## 2019-01-25 DIAGNOSIS — F331 Major depressive disorder, recurrent, moderate: Secondary | ICD-10-CM

## 2019-01-29 ENCOUNTER — Other Ambulatory Visit: Payer: Self-pay

## 2019-01-29 ENCOUNTER — Ambulatory Visit (INDEPENDENT_AMBULATORY_CARE_PROVIDER_SITE_OTHER): Payer: BC Managed Care – PPO | Admitting: Physician Assistant

## 2019-01-29 VITALS — BP 117/78 | HR 79 | Temp 98.5°F | Ht 65.0 in | Wt 221.0 lb

## 2019-01-29 DIAGNOSIS — R7303 Prediabetes: Secondary | ICD-10-CM

## 2019-01-29 DIAGNOSIS — E559 Vitamin D deficiency, unspecified: Secondary | ICD-10-CM | POA: Diagnosis not present

## 2019-01-29 DIAGNOSIS — Z9189 Other specified personal risk factors, not elsewhere classified: Secondary | ICD-10-CM | POA: Diagnosis not present

## 2019-01-29 DIAGNOSIS — Z6836 Body mass index (BMI) 36.0-36.9, adult: Secondary | ICD-10-CM

## 2019-01-29 DIAGNOSIS — E785 Hyperlipidemia, unspecified: Secondary | ICD-10-CM | POA: Diagnosis not present

## 2019-01-29 MED ORDER — CHOLECALCIFEROL 1.25 MG (50000 UT) PO TABS: ORAL_TABLET | ORAL | 0 refills | Status: DC

## 2019-01-29 MED ORDER — METFORMIN HCL 500 MG PO TABS: 500.0000 mg | ORAL_TABLET | Freq: Every day | ORAL | 0 refills | Status: DC

## 2019-01-30 LAB — LIPID PANEL WITH LDL/HDL RATIO
Cholesterol, Total: 155 mg/dL (ref 100–199)
HDL: 49 mg/dL (ref >39)
LDL Chol Calc (NIH): 94 mg/dL (ref 0–99)
LDL/HDL Ratio: 1.9 ratio (ref 0.0–3.2)
Triglycerides: 58 mg/dL (ref 0–149)
VLDL Cholesterol Cal: 12 mg/dL (ref 5–40)

## 2019-01-30 LAB — COMPREHENSIVE METABOLIC PANEL
ALT: 13 IU/L (ref 0–32)
AST: 14 IU/L (ref 0–40)
Albumin/Globulin Ratio: 1.8 (ref 1.2–2.2)
Albumin: 4.4 g/dL (ref 3.8–4.8)
Alkaline Phosphatase: 55 IU/L (ref 39–117)
BUN/Creatinine Ratio: 17 (ref 9–23)
BUN: 12 mg/dL (ref 6–20)
Bilirubin Total: 0.5 mg/dL (ref 0.0–1.2)
CO2: 24 mmol/L (ref 20–29)
Calcium: 9.5 mg/dL (ref 8.7–10.2)
Chloride: 102 mmol/L (ref 96–106)
Creatinine, Ser: 0.69 mg/dL (ref 0.57–1.00)
GFR calc Af Amer: 127 mL/min/{1.73_m2} (ref >59)
GFR calc non Af Amer: 110 mL/min/{1.73_m2} (ref >59)
Globulin, Total: 2.5 g/dL (ref 1.5–4.5)
Glucose: 110 mg/dL — ABNORMAL HIGH (ref 65–99)
Potassium: 4.7 mmol/L (ref 3.5–5.2)
Sodium: 139 mmol/L (ref 134–144)
Total Protein: 6.9 g/dL (ref 6.0–8.5)

## 2019-01-30 LAB — VITAMIN D 25 HYDROXY (VIT D DEFICIENCY, FRACTURES): Vit D, 25-Hydroxy: 56.6 ng/mL (ref 30.0–100.0)

## 2019-01-30 LAB — INSULIN, RANDOM: INSULIN: 20 u[IU]/mL (ref 2.6–24.9)

## 2019-01-30 LAB — HEMOGLOBIN A1C
Est. average glucose Bld gHb Est-mCnc: 120 mg/dL
Hgb A1c MFr Bld: 5.8 % — ABNORMAL HIGH (ref 4.8–5.6)

## 2019-01-31 ENCOUNTER — Other Ambulatory Visit: Payer: Self-pay | Admitting: Obstetrics and Gynecology

## 2019-01-31 NOTE — Progress Notes (Signed)
Chief Complaint:   OBESITY [name] is here to discuss her progress with her obesity treatment plan along with follow-up of her obesity related diagnoses. [name] is on the Category 2 Plan and states she is following her eating plan approximately 50% of the time. [name] states she is walking 30 minutes 4 times per week.  Today's visit was #: 15 Starting weight: 233 lbs Starting date: 02/06/2018 Today's weight: 221 lbs Today's date: 01/29/2019 Total lbs lost to date: 12 Total lbs lost since last in-office visit: 3  Interim History: [name] reports that since changing OCP's (oral contraceptive pill), her cravings have increased. She has started walking and she is feeling good.  Subjective:   Prediabetes  [name] is on metformin once daily. Her last A1c was 5.7  (08/30/18). [name] denies nausea, vomiting or diarrhea. Labs were discussed with patient today. [name] is due for labs.  Dyslipidemia  [name]'s last lipid panel was within normal range. She is not on medications. [name] has no chest pain.  At risk for diabetes mellitus [name] is at higher than average risk for developing diabetes due to her obesity and prediabetes.   Vitamin D deficiency  [name] is on weekly vitamin D. Her last vitamin D level was 43.9 (08/30/18). She is due for labs. [name] denies nausea, vomiting or muscle weakness.  Assessment/Plan:   Prediabetes  [name] will continue to work on weight loss, exercise, and decreasing simple carbohydrates to help decrease the risk of diabetes. [name] agrees to continue metformin 500 mg daily #30 with no refills. We will check labs today.  Dyslipidemia  Cardiovascular risk and specific lipid/LDL goals reviewed.  We discussed several lifestyle modifications today and [name] will continue to work on diet, exercise and weight loss efforts. Orders and follow up as documented in patient record.   Counseling Intensive lifestyle modifications are the first line treatment  for this issue. . Dietary changes: Increase soluble fiber. Decrease simple carbohydrates. . Exercise changes: Moderate to vigorous-intensity aerobic activity 150 minutes per week if tolerated. . Lipid-lowering medications: see documented in medical record.  At risk for diabetes mellitus [name] was given approximately 15 minutes of diabetes education and counseling today. We discussed intensive lifestyle modifications today with an emphasis on weight loss as well as increasing exercise and decreasing simple carbohydrates in her diet. We also reviewed medication options with an emphasis on risk versus benefit of those discussed.   Vitamin D deficiency  Low Vitamin D level contributes to fatigue and are associated with obesity, breast, and colon cancer. [name] agrees to continue to take prescription Vitamin D @50 ,000 IU every week #4 with no refills and she will follow-up for routine testing of vitamin D, at least 2-3 times per year to avoid over-replacement. We will check vitamin D level today.  Obesity [name] is currently in the action stage of change. As such, her goal is to continue with weight loss efforts. She has agreed to on the Category 2 Plan.   We discussed the following exercise goals today: For substantial health benefits, adults should do at least 150 minutes (2 hours and 30 minutes) a week of moderate-intensity, or 75 minutes (1 hour and 15 minutes) a week of vigorous-intensity aerobic physical activity, or an equivalent combination of moderate- and vigorous-intensity aerobic activity. Aerobic activity should be performed in episodes of at least 10 minutes, and preferably, it should be spread throughout the week. Adults should also include muscle-strengthening activities that involve all major muscle groups on  2 or more days a week.  We discussed the following behavioral modification strategies today: increasing lean protein intake and decreasing simple carbohydrates.  [name]  has agreed to follow-up with our clinic in 2 weeks. She was informed of the importance of frequent follow-up visits to maximize her success with intensive lifestyle modifications for her multiple health conditions.   [name] was informed we would discuss her lab results at her next visit unless there is a critical issue that needs to be addressed sooner. [name] agreed to keep her next visit at the agreed upon time to discuss these results.  Objective:   Blood pressure 117/78, pulse 79, temperature 98.5 F (36.9 C), height 5\' 5"  (1.651 m), weight 221 lb (100.2 kg), SpO2 100 %, unknown if currently breastfeeding. Body mass index is 36.78 kg/m.  General: Cooperative, alert, well developed, in no acute distress. HEENT: Conjunctivae and lids unremarkable. Neck: No thyromegaly.  Cardiovascular: Regular rhythm.  Lungs: Normal work of breathing. Extremities: No edema.  Neurologic: No focal deficits.   Lab Results  Component Value Date   CREATININE 0.69 01/29/2019   BUN 12 01/29/2019   NA 139 01/29/2019   K 4.7 01/29/2019   CL 102 01/29/2019   CO2 24 01/29/2019   Lab Results  Component Value Date   ALT 13 01/29/2019   AST 14 01/29/2019   ALKPHOS 55 01/29/2019   BILITOT 0.5 01/29/2019   Lab Results  Component Value Date   HGBA1C 5.8 (H) 01/29/2019   HGBA1C 5.7 (H) 08/30/2018   HGBA1C 5.8 (H) 12/27/2017   HGBA1C 6.0 (H) 09/27/2017   HGBA1C 5.9 (H) 09/30/2011   Lab Results  Component Value Date   INSULIN 20.0 01/29/2019   INSULIN 33.0 (H) 08/30/2018   INSULIN 26.2 (H) 02/06/2018   Lab Results  Component Value Date   TSH 1.870 09/13/2018   Lab Results  Component Value Date   CHOL 155 01/29/2019   HDL 49 01/29/2019   LDLCALC 94 01/29/2019   TRIG 58 01/29/2019   CHOLHDL 3.4 09/27/2017   Lab Results  Component Value Date   WBC 6.4 10/02/2018   HGB 11.8 10/02/2018   HCT 35.4 10/02/2018   MCV 86.1 10/02/2018   PLT 360 10/02/2018   Lab Results  Component Value Date     IRON 54 09/30/2017   TIBC 373 09/30/2017   FERRITIN 81 09/30/2017    Ref. Range 08/30/2018 13:49  Vitamin D, 25-Hydroxy Latest Ref Range: 30.0 - 100.0 ng/mL 43.9    Attestation Statements:   Reviewed by clinician on day of visit: allergies, medications, problem list, medical history, surgical history, family history, social history, and previous encounter notes.  Corey Skains, am acting as Location manager for [employer], PA-C.  I have reviewed the above documentation for accuracy and completeness, and I agree with the above. Abby Potash, PA-C

## 2019-02-01 ENCOUNTER — Ambulatory Visit: Payer: BC Managed Care – PPO | Admitting: Professional

## 2019-02-08 ENCOUNTER — Ambulatory Visit: Payer: BC Managed Care – PPO | Admitting: Professional

## 2019-02-15 ENCOUNTER — Ambulatory Visit (INDEPENDENT_AMBULATORY_CARE_PROVIDER_SITE_OTHER): Payer: BC Managed Care – PPO | Admitting: Professional

## 2019-02-15 DIAGNOSIS — F331 Major depressive disorder, recurrent, moderate: Secondary | ICD-10-CM

## 2019-02-15 DIAGNOSIS — F411 Generalized anxiety disorder: Secondary | ICD-10-CM | POA: Diagnosis not present

## 2019-02-21 ENCOUNTER — Other Ambulatory Visit: Payer: Self-pay

## 2019-02-21 ENCOUNTER — Ambulatory Visit (INDEPENDENT_AMBULATORY_CARE_PROVIDER_SITE_OTHER): Payer: BC Managed Care – PPO | Admitting: Physician Assistant

## 2019-02-21 ENCOUNTER — Encounter (INDEPENDENT_AMBULATORY_CARE_PROVIDER_SITE_OTHER): Payer: Self-pay | Admitting: Physician Assistant

## 2019-02-21 VITALS — BP 110/71 | HR 85 | Temp 98.2°F | Ht 65.0 in | Wt 221.0 lb

## 2019-02-21 DIAGNOSIS — F3289 Other specified depressive episodes: Secondary | ICD-10-CM

## 2019-02-21 DIAGNOSIS — E559 Vitamin D deficiency, unspecified: Secondary | ICD-10-CM

## 2019-02-21 DIAGNOSIS — Z9189 Other specified personal risk factors, not elsewhere classified: Secondary | ICD-10-CM

## 2019-02-21 DIAGNOSIS — Z6836 Body mass index (BMI) 36.0-36.9, adult: Secondary | ICD-10-CM

## 2019-02-21 MED ORDER — BUPROPION HCL ER (SR) 150 MG PO TB12: 150.0000 mg | ORAL_TABLET | Freq: Two times a day (BID) | ORAL | 0 refills | Status: DC

## 2019-02-21 NOTE — Telephone Encounter (Signed)
Please advise 

## 2019-02-21 NOTE — Telephone Encounter (Signed)
Ok to send in refill. Thanks

## 2019-02-21 NOTE — Progress Notes (Signed)
Chief Complaint:   [name] is here to discuss her progress with her Courtney treatment plan along with follow-up of her Courtney related diagnoses. [name] is on the Category 2 Plan and states she is following her eating plan approximately 40% of the time. [name] states she is exercising 0 minutes 0 times per week.  Today's visit was #: 43 Starting weight: 233 lbs Starting date: 02/06/2018 Today's weight: 221 lbs Today's date: 02/21/2019 Total lbs lost to date: 12 Total lbs lost since last in-office visit: 0  Interim History: [name] states she has had some personal issues recently, which has caused her to not eat on the plan. She has not been exercising.  Subjective:   Vitamin D deficiency [name]'s Vitamin D level was 56.6 on 01/29/19. She is on weekly vitamin D. She denies nausea, vomiting or muscle weakness. Labs were discussed with patient today.  Other depression, with emotional eating [name] is struggling with emotional eating and using food for comfort to the extent that it is negatively impacting her health. She is on Bupropion. Her blood pressure is normal. She has no cravings, but she notes a decrease in her mood. She has been working on behavior modification techniques to help reduce her emotional eating and has been somewhat successful. She shows no sign of suicidal or homicidal ideations.  At risk for osteoporosis [name] is at higher risk of osteopenia and osteoporosis due to Vitamin D deficiency.   Assessment/Plan:   Vitamin D deficiency Low Vitamin D level contributes to fatigue and are associated with Courtney, breast, and colon cancer. [name] agrees to change to OTC Vitamin D 5,000 IU daily (discontinue prescription). [name] will follow-up for routine testing of Vitamin D, at least 2-3 times per year to avoid over-replacement.  Other depression, with emotional eating Behavior modification techniques were discussed today to help [name] deal with her  emotional/non-hunger eating behaviors. Shahida agrees to increase Wellbutrin to 150 mg twice daily and prescription was written today for 150 mg two times daily #60 with no refills. Kanaiya will restart exercise. Orders and follow up as documented in patient record.   At risk for osteoporosis [name] was given approximately 15 minutes of osteoporosis prevention counseling today. [name] is at risk for osteopenia and osteoporosis due to her Vitamin D deficiency. She was encouraged to take her Vitamin D and follow her higher calcium diet and increase strengthening exercise to help strengthen her bones and decrease her risk of osteopenia and osteoporosis.  Repetitive spaced learning was employed today to elicit superior memory formation and behavioral change.  Class 2 severe Courtney with serious comorbidity and body mass index (BMI) of 36.0 to 36.9 in adult, unspecified Courtney type (HCC) [name] is currently in the action stage of change. As such, her goal is to continue with weight loss efforts. She has agreed to the Category 2 Plan.   Exercise goals: [name] will restart exercise.  Behavioral modification strategies: no skipping meals, meal planning and cooking strategies and keeping healthy foods in the home.  [name] has agreed to follow-up with our clinic in 2 weeks. She was informed of the importance of frequent follow-up visits to maximize her success with intensive lifestyle modifications for her multiple health conditions.   Objective:   Blood pressure 110/71, pulse 85, temperature 98.2 F (36.8 C), temperature source Oral, height 5\' 5"  (1.651 m), weight 221 lb (100.2 kg), last menstrual period 02/20/2019, SpO2 97 %, unknown if currently breastfeeding. Body mass index is 36.78  kg/m.  General: Cooperative, alert, well developed, in no acute distress. HEENT: Conjunctivae and lids unremarkable. Cardiovascular: Regular rhythm.  Lungs: Normal work of breathing. Neurologic: No focal deficits.    Lab Results  Component Value Date   CREATININE 0.69 01/29/2019   BUN 12 01/29/2019   NA 139 01/29/2019   K 4.7 01/29/2019   CL 102 01/29/2019   CO2 24 01/29/2019   Lab Results  Component Value Date   ALT 13 01/29/2019   AST 14 01/29/2019   ALKPHOS 55 01/29/2019   BILITOT 0.5 01/29/2019   Lab Results  Component Value Date   HGBA1C 5.8 (H) 01/29/2019   HGBA1C 5.7 (H) 08/30/2018   HGBA1C 5.8 (H) 12/27/2017   HGBA1C 6.0 (H) 09/27/2017   HGBA1C 5.9 (H) 09/30/2011   Lab Results  Component Value Date   INSULIN 20.0 01/29/2019   INSULIN 33.0 (H) 08/30/2018   INSULIN 26.2 (H) 02/06/2018   Lab Results  Component Value Date   TSH 1.870 09/13/2018   Lab Results  Component Value Date   CHOL 155 01/29/2019   HDL 49 01/29/2019   LDLCALC 94 01/29/2019   TRIG 58 01/29/2019   CHOLHDL 3.4 09/27/2017   Lab Results  Component Value Date   WBC 6.4 10/02/2018   HGB 11.8 10/02/2018   HCT 35.4 10/02/2018   MCV 86.1 10/02/2018   PLT 360 10/02/2018   Lab Results  Component Value Date   IRON 54 09/30/2017   TIBC 373 09/30/2017   FERRITIN 81 09/30/2017    Ref. Range 01/29/2019 12:28  Vitamin D, 25-Hydroxy Latest Ref Range: 30.0 - 100.0 ng/mL 56.6    Attestation Statements:   Reviewed by clinician on day of visit: allergies, medications, problem list, medical history, surgical history, family history, social history, and previous encounter notes.  Corey Skains, am acting as Location manager for [employer], PA-C.  I have reviewed the above documentation for accuracy and completeness, and I agree with the above. Abby Potash, PA-C

## 2019-02-22 ENCOUNTER — Ambulatory Visit: Payer: BC Managed Care – PPO | Admitting: Professional

## 2019-03-01 ENCOUNTER — Ambulatory Visit: Payer: BC Managed Care – PPO | Admitting: Professional

## 2019-03-08 ENCOUNTER — Ambulatory Visit: Payer: BC Managed Care – PPO | Admitting: Professional

## 2019-03-08 ENCOUNTER — Ambulatory Visit (INDEPENDENT_AMBULATORY_CARE_PROVIDER_SITE_OTHER): Payer: BC Managed Care – PPO | Admitting: Professional

## 2019-03-08 DIAGNOSIS — F331 Major depressive disorder, recurrent, moderate: Secondary | ICD-10-CM | POA: Diagnosis not present

## 2019-03-08 DIAGNOSIS — F411 Generalized anxiety disorder: Secondary | ICD-10-CM | POA: Diagnosis not present

## 2019-03-12 ENCOUNTER — Other Ambulatory Visit: Payer: Self-pay

## 2019-03-12 ENCOUNTER — Telehealth (INDEPENDENT_AMBULATORY_CARE_PROVIDER_SITE_OTHER): Payer: BC Managed Care – PPO | Admitting: Physician Assistant

## 2019-03-12 ENCOUNTER — Encounter (INDEPENDENT_AMBULATORY_CARE_PROVIDER_SITE_OTHER): Payer: Self-pay | Admitting: Physician Assistant

## 2019-03-12 DIAGNOSIS — R7303 Prediabetes: Secondary | ICD-10-CM | POA: Diagnosis not present

## 2019-03-12 DIAGNOSIS — Z6836 Body mass index (BMI) 36.0-36.9, adult: Secondary | ICD-10-CM | POA: Diagnosis not present

## 2019-03-12 DIAGNOSIS — E559 Vitamin D deficiency, unspecified: Secondary | ICD-10-CM

## 2019-03-12 MED ORDER — CHOLECALCIFEROL 1.25 MG (50000 UT) PO TABS: ORAL_TABLET | ORAL | 0 refills | Status: DC

## 2019-03-12 NOTE — Progress Notes (Signed)
TeleHealth Visit:  Due to the COVID-19 pandemic, this visit was completed with telemedicine (audio/video) technology to reduce patient and provider exposure as well as to preserve personal protective equipment.   Symphoni has verbally consented to this TeleHealth visit. The patient is located at home, the provider is located at the [facility] office. The participants in this visit include the listed provider and patient. The visit was conducted today via Face Time.  Chief Complaint: OBESITY Millena is here to discuss her progress with her obesity treatment plan along with follow-up of her obesity related diagnoses. Keah is on the Category 2 Plan and states she is following her eating plan approximately 20% of the time. [place] states she is doing boot camp for 30 minutes 3 times per week.  Today's visit was #: 17 Starting weight: 233 lbs Starting date: 02/06/2018  Interim History: Deserea's most recent weight was 222 pounds.  She reports that her appetite has been decreased with stress at work and she has only been eating once a day.  She believes that she currently has a sinus infection.  Subjective:   1. Vitamin D deficiency Yurika's Vitamin D level was 56.6 on 01/29/2019. She is currently taking vit D. She denies nausea, vomiting or muscle weakness.  2. Prediabetes Lakeitha has a diagnosis of prediabetes based on her elevated HgA1c and was informed this puts her at greater risk of developing diabetes. She continues to work on diet and exercise to decrease her risk of diabetes. She denies nausea or hypoglycemia.  Taking metformin 500 mg daily.  Lab Results  Component Value Date   HGBA1C 5.8 (H) 01/29/2019   Lab Results  Component Value Date   INSULIN 20.0 01/29/2019   INSULIN 33.0 (H) 08/30/2018   INSULIN 26.2 (H) 02/06/2018   Assessment/Plan:   1. Vitamin D deficiency Low Vitamin D level contributes to fatigue and are associated with obesity, breast, and colon  cancer. She agrees to continue to take prescription Vitamin D @50 ,000 IU every week and will follow-up for routine testing of Vitamin D, at least 2-3 times per year to avoid over-replacement. - Cholecalciferol 1.25 MG (50000 UT) TABS; 1 tablet weekly  Dispense: 4 tablet; Refill: 0  2. Prediabetes Elmire will continue to work on weight loss, exercise, and decreasing simple carbohydrates to help decrease the risk of diabetes.   3. Class 2 severe obesity with serious comorbidity and body mass index (BMI) of 36.0 to 36.9 in adult, unspecified obesity type (HCC) Haniah is currently in the action stage of change. As such, her goal is to continue with weight loss efforts. She has agreed to the Category 2 Plan.   Exercise goals: As is.  Behavioral modification strategies: meal planning and cooking strategies and keeping healthy foods in the home.  Vesna has agreed to follow-up with our clinic in 2 weeks. She was informed of the importance of frequent follow-up visits to maximize her success with intensive lifestyle modifications for her multiple health conditions.  Objective:   VITALS: Per patient if applicable, see vitals. GENERAL: Alert and in no acute distress. CARDIOPULMONARY: No increased WOB. Speaking in clear sentences.  PSYCH: Pleasant and cooperative. Speech normal rate and rhythm. Affect is appropriate. Insight and judgement are appropriate. Attention is focused, linear, and appropriate.  NEURO: Oriented as arrived to appointment on time with no prompting.   Lab Results  Component Value Date   CREATININE 0.69 01/29/2019   BUN 12 01/29/2019   NA 139 01/29/2019  K 4.7 01/29/2019   CL 102 01/29/2019   CO2 24 01/29/2019   Lab Results  Component Value Date   ALT 13 01/29/2019   AST 14 01/29/2019   ALKPHOS 55 01/29/2019   BILITOT 0.5 01/29/2019   Lab Results  Component Value Date   HGBA1C 5.8 (H) 01/29/2019   HGBA1C 5.7 (H) 08/30/2018   HGBA1C 5.8 (H) 12/27/2017   HGBA1C 6.0  (H) 09/27/2017   HGBA1C 5.9 (H) 09/30/2011   Lab Results  Component Value Date   INSULIN 20.0 01/29/2019   INSULIN 33.0 (H) 08/30/2018   INSULIN 26.2 (H) 02/06/2018   Lab Results  Component Value Date   TSH 1.870 09/13/2018   Lab Results  Component Value Date   CHOL 155 01/29/2019   HDL 49 01/29/2019   LDLCALC 94 01/29/2019   TRIG 58 01/29/2019   CHOLHDL 3.4 09/27/2017   Lab Results  Component Value Date   WBC 6.4 10/02/2018   HGB 11.8 10/02/2018   HCT 35.4 10/02/2018   MCV 86.1 10/02/2018   PLT 360 10/02/2018   Lab Results  Component Value Date   IRON 54 09/30/2017   TIBC 373 09/30/2017   FERRITIN 81 09/30/2017   Attestation Statements:   Reviewed by clinician on day of visit: allergies, medications, problem list, medical history, surgical history, family history, social history, and previous encounter notes.  I, Water quality scientist, CMA, am acting as Location manager for [employer], PA-C.  I have reviewed the above documentation for accuracy and completeness, and I agree with the above. Abby Potash, PA-C

## 2019-03-15 ENCOUNTER — Ambulatory Visit: Payer: BC Managed Care – PPO | Admitting: Professional

## 2019-03-22 ENCOUNTER — Ambulatory Visit: Payer: BC Managed Care – PPO | Admitting: Professional

## 2019-03-22 ENCOUNTER — Ambulatory Visit: Payer: Self-pay | Attending: Internal Medicine

## 2019-03-22 ENCOUNTER — Other Ambulatory Visit: Payer: Self-pay

## 2019-03-22 DIAGNOSIS — Z23 Encounter for immunization: Secondary | ICD-10-CM | POA: Insufficient documentation

## 2019-03-22 NOTE — Progress Notes (Signed)
   Covid-19 Vaccination Clinic  Name:  [name]    MRN: [id number] DOB: [birth year]  03/22/2019  Ms. [name] was observed post Covid-19 immunization for 15 minutes without incident. She was provided with Vaccine Information Sheet and instruction to access the V-Safe system.   Ms. [name] was instructed to call 911 with any severe reactions post vaccine: Marland Kitchen Difficulty breathing  . Swelling of face and throat  . A fast heartbeat  . A bad rash all over body  . Dizziness and weakness   Immunizations Administered    Name Date Dose VIS Date Route   Moderna COVID-19 Vaccine 03/22/2019 10:04 AM 0.5 mL 12/19/2018 Intramuscular   Manufacturer: Moderna   Lot: [id number]   [id number]

## 2019-03-23 ENCOUNTER — Ambulatory Visit: Payer: BC Managed Care – PPO | Admitting: Professional

## 2019-03-29 ENCOUNTER — Other Ambulatory Visit: Payer: Self-pay

## 2019-03-29 ENCOUNTER — Ambulatory Visit (INDEPENDENT_AMBULATORY_CARE_PROVIDER_SITE_OTHER): Payer: BC Managed Care – PPO | Admitting: Professional

## 2019-03-29 ENCOUNTER — Encounter (INDEPENDENT_AMBULATORY_CARE_PROVIDER_SITE_OTHER): Payer: Self-pay | Admitting: Physician Assistant

## 2019-03-29 ENCOUNTER — Ambulatory Visit (INDEPENDENT_AMBULATORY_CARE_PROVIDER_SITE_OTHER): Payer: BC Managed Care – PPO | Admitting: Physician Assistant

## 2019-03-29 VITALS — BP 118/80 | HR 87 | Temp 98.4°F | Ht 65.0 in | Wt 220.0 lb

## 2019-03-29 DIAGNOSIS — R7303 Prediabetes: Secondary | ICD-10-CM | POA: Diagnosis not present

## 2019-03-29 DIAGNOSIS — F3289 Other specified depressive episodes: Secondary | ICD-10-CM | POA: Diagnosis not present

## 2019-03-29 DIAGNOSIS — Z6836 Body mass index (BMI) 36.0-36.9, adult: Secondary | ICD-10-CM

## 2019-03-29 DIAGNOSIS — F411 Generalized anxiety disorder: Secondary | ICD-10-CM

## 2019-03-29 DIAGNOSIS — F331 Major depressive disorder, recurrent, moderate: Secondary | ICD-10-CM

## 2019-03-29 DIAGNOSIS — Z9189 Other specified personal risk factors, not elsewhere classified: Secondary | ICD-10-CM | POA: Diagnosis not present

## 2019-03-29 MED ORDER — METFORMIN HCL 500 MG PO TABS: 500.0000 mg | ORAL_TABLET | Freq: Every day | ORAL | 0 refills | Status: DC

## 2019-03-29 MED ORDER — BUPROPION HCL ER (SR) 150 MG PO TB12: 150.0000 mg | ORAL_TABLET | Freq: Two times a day (BID) | ORAL | 0 refills | Status: DC

## 2019-03-29 NOTE — Progress Notes (Signed)
Chief Complaint:   OBESITY [name] is here to discuss her progress with her obesity treatment plan along with follow-up of her obesity related diagnoses. [name] is on the Category 2 Plan and states she is following her eating plan approximately 10-20% of the time. [name] states she is walking 20-30 minutes 2 times per week.  Today's visit was #: 18 Starting weight: 233 lbs Starting date: 02/06/2018 Today's weight: 220 lbs Today's date: 03/29/2019 Total lbs lost to date: 13 Total lbs lost since last in-office visit: 1  Interim History: Mallary reports that she was able to get back on plan this week now that she is feeling better. She reports that she tends to snack after dinner.  Subjective:   Other depression, with emotional eating. [name] is struggling with emotional eating and using food for comfort to the extent that it is negatively impacting her health. She has been working on behavior modification techniques to help reduce her emotional eating and has been somewhat successful. She shows no sign of suicidal or homicidal ideations. [name] is on bupropion. She reports that it helps with cravings. Blood pressure is normal.  Prediabetes. [name] has a diagnosis of prediabetes based on her elevated HgA1c and was informed this puts her at greater risk of developing diabetes. She continues to work on diet and exercise to decrease her risk of diabetes. She denies nausea, vomiting, or diarrhea. [name] is on metformin.  Lab Results  Component Value Date   HGBA1C 5.8 (H) 01/29/2019   Lab Results  Component Value Date   INSULIN 20.0 01/29/2019   INSULIN 33.0 (H) 08/30/2018   INSULIN 26.2 (H) 02/06/2018   At risk for diabetes mellitus. [name] is at higher than average risk for developing diabetes due to her obesity.   Assessment/Plan:   Other depression, with emotional eating. Behavior modification techniques were discussed today to help [name] deal with her  emotional/non-hunger eating behaviors.  Orders and follow up as documented in patient record. Taimane was given a refill on her buPROPion (WELLBUTRIN SR) 150 MG 12 hr tablet #60 with 0 refills.  Prediabetes. [name] will continue to work on weight loss, exercise, and decreasing simple carbohydrates to help decrease the risk of diabetes. She was given a refill on her metFORMIN (GLUCOPHAGE) 500 MG tablet #30 with 0 refills.  At risk for diabetes mellitus. [name] was given approximately 15 minutes of diabetes education and counseling today. We discussed intensive lifestyle modifications today with an emphasis on weight loss as well as increasing exercise and decreasing simple carbohydrates in her diet. We also reviewed medication options with an emphasis on risk versus benefit of those discussed.   Repetitive spaced learning was employed today to elicit superior memory formation and behavioral change.  Class 2 severe obesity with serious comorbidity and body mass index (BMI) of 36.0 to 36.9 in adult, unspecified obesity type ([name]).  [name] is currently in the action stage of change. As such, her goal is to continue with weight loss efforts. She has agreed to the Category 2 Plan.   Exercise goals: For substantial health benefits, adults should do at least 150 minutes (2 hours and 30 minutes) a week of moderate-intensity, or 75 minutes (1 hour and 15 minutes) a week of vigorous-intensity aerobic physical activity, or an equivalent combination of moderate- and vigorous-intensity aerobic activity. Aerobic activity should be performed in episodes of at least 10 minutes, and preferably, it should be spread throughout the week.  Behavioral modification strategies: no  skipping meals and ways to avoid night time snacking.  [name] has agreed to follow-up with our clinic in 2-3 weeks. She was informed of the importance of frequent follow-up visits to maximize her success with intensive lifestyle modifications for  her multiple health conditions.   Objective:   Blood pressure 118/80, pulse 87, temperature 98.4 F (36.9 C), temperature source Oral, height 5\' 5"  (1.651 m), weight 220 lb (99.8 kg), SpO2 97 %, unknown if currently breastfeeding. Body mass index is 36.61 kg/m.  General: Cooperative, alert, well developed, in no acute distress. HEENT: Conjunctivae and lids unremarkable. Cardiovascular: Regular rhythm.  Lungs: Normal work of breathing. Neurologic: No focal deficits.   Lab Results  Component Value Date   CREATININE 0.69 01/29/2019   BUN 12 01/29/2019   NA 139 01/29/2019   K 4.7 01/29/2019   CL 102 01/29/2019   CO2 24 01/29/2019   Lab Results  Component Value Date   ALT 13 01/29/2019   AST 14 01/29/2019   ALKPHOS 55 01/29/2019   BILITOT 0.5 01/29/2019   Lab Results  Component Value Date   HGBA1C 5.8 (H) 01/29/2019   HGBA1C 5.7 (H) 08/30/2018   HGBA1C 5.8 (H) 12/27/2017   HGBA1C 6.0 (H) 09/27/2017   HGBA1C 5.9 (H) 09/30/2011   Lab Results  Component Value Date   INSULIN 20.0 01/29/2019   INSULIN 33.0 (H) 08/30/2018   INSULIN 26.2 (H) 02/06/2018   Lab Results  Component Value Date   TSH 1.870 09/13/2018   Lab Results  Component Value Date   CHOL 155 01/29/2019   HDL 49 01/29/2019   LDLCALC 94 01/29/2019   TRIG 58 01/29/2019   CHOLHDL 3.4 09/27/2017   Lab Results  Component Value Date   WBC 6.4 10/02/2018   HGB 11.8 10/02/2018   HCT 35.4 10/02/2018   MCV 86.1 10/02/2018   PLT 360 10/02/2018   Lab Results  Component Value Date   IRON 54 09/30/2017   TIBC 373 09/30/2017   FERRITIN 81 09/30/2017   Attestation Statements:   Reviewed by clinician on day of visit: allergies, medications, problem list, medical history, surgical history, family history, social history, and previous encounter notes.  [name], am acting as transcriptionist for Abby Potash, PA-C   I have reviewed the above documentation for accuracy and completeness, and I agree  with the above. Abby Potash, PA-C

## 2019-04-05 ENCOUNTER — Ambulatory Visit: Payer: BC Managed Care – PPO | Admitting: Professional

## 2019-04-12 ENCOUNTER — Ambulatory Visit: Payer: BC Managed Care – PPO | Admitting: Professional

## 2019-04-14 ENCOUNTER — Ambulatory Visit (INDEPENDENT_AMBULATORY_CARE_PROVIDER_SITE_OTHER): Payer: BC Managed Care – PPO | Admitting: Professional

## 2019-04-14 DIAGNOSIS — F411 Generalized anxiety disorder: Secondary | ICD-10-CM | POA: Diagnosis not present

## 2019-04-14 DIAGNOSIS — F331 Major depressive disorder, recurrent, moderate: Secondary | ICD-10-CM | POA: Diagnosis not present

## 2019-04-19 ENCOUNTER — Ambulatory Visit (INDEPENDENT_AMBULATORY_CARE_PROVIDER_SITE_OTHER): Payer: BC Managed Care – PPO | Admitting: Physician Assistant

## 2019-04-19 ENCOUNTER — Other Ambulatory Visit: Payer: Self-pay

## 2019-04-19 ENCOUNTER — Encounter (INDEPENDENT_AMBULATORY_CARE_PROVIDER_SITE_OTHER): Payer: Self-pay | Admitting: Physician Assistant

## 2019-04-19 VITALS — BP 133/86 | HR 84 | Temp 98.6°F | Ht 65.0 in | Wt 227.0 lb

## 2019-04-19 DIAGNOSIS — E559 Vitamin D deficiency, unspecified: Secondary | ICD-10-CM

## 2019-04-19 DIAGNOSIS — Z9189 Other specified personal risk factors, not elsewhere classified: Secondary | ICD-10-CM

## 2019-04-19 DIAGNOSIS — Z6837 Body mass index (BMI) 37.0-37.9, adult: Secondary | ICD-10-CM

## 2019-04-19 DIAGNOSIS — F3289 Other specified depressive episodes: Secondary | ICD-10-CM

## 2019-04-19 MED ORDER — BUPROPION HCL ER (SR) 150 MG PO TB12: 150.0000 mg | ORAL_TABLET | Freq: Two times a day (BID) | ORAL | 0 refills | Status: DC

## 2019-04-19 MED ORDER — CHOLECALCIFEROL 1.25 MG (50000 UT) PO TABS: ORAL_TABLET | ORAL | 0 refills | Status: DC

## 2019-04-19 NOTE — Progress Notes (Signed)
Chief Complaint:   [name] is here to discuss her progress with her Courtney treatment plan along with follow-up of her Courtney related diagnoses. [name] is on the Category 2 Plan and states she is following her eating plan approximately 0% of the time. [name] states she is exercising 0 minutes 0 times per week.  Today's visit was #: 71 Starting weight: 233 lbs Starting date: 02/06/2018 Today's weight: 227 lbs Today's date: 04/19/2019 Total lbs lost to date: 6 Total lbs lost since last in-office visit: 0  Interim History: [name] states that she has been under a lot of stress at home and has kept a crazy schedule. She is skipping breakfast and sometimes only eating once a day.  Subjective:   Other depression, with emotional eating. [name] is struggling with emotional eating and using food for comfort to the extent that it is negatively impacting her health. She has been working on behavior modification techniques to help reduce her emotional eating and has been somewhat successful. She shows no sign of suicidal or homicidal ideations. No cravings. Blood pressure normal.  Vitamin D deficiency. [name] is on prescription Vitamin D weekly. No nausea, vomiting, or muscle weakness. Last Vitamin D 56.6 on 01/29/2019.  At risk for deficient intake of food. The patient is at a higher than average risk of deficient intake of food due to only consuming 1 meal a day.  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  Assessment/Plan:   Other depression, with emotional eating. Behavior modification techniques were discussed today to help [name] deal with her emotional/non-hunger eating behaviors.  Orders and follow up as documented in patient record. Refill was given for buPROPion (WELLBUTRIN SR) 150 MG 12 hr tablet #60 with 0 refills.  Vitamin D deficiency. Low Vitamin D level contributes to  fatigue and are associated with Courtney, breast, and colon cancer. She was given a refill for Cholecalciferol 1.25 MG (50000 UT) TABS every week #4 with 0 refills and will follow-up for routine testing of Vitamin D, at least 2-3 times per year to avoid over-replacement.       At risk for deficient intake of food. [name] was given approximately 15 minutes of deficit intake of food prevention counseling today. [name] is at risk for eating too few calories based on current food recall. She was encouraged to focus on meeting caloric and protein goals according to her recommended meal plan.   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  [name] is currently in the action stage of change. As such, her goal is to continue with weight loss efforts. She has agreed to the Category 2 Plan.   Exercise goals: For substantial health benefits, adults should do at least 150 minutes (2 hours and 30 minutes) a week of moderate-intensity, or 75 minutes (1 hour and 15 minutes) a week of vigorous-intensity aerobic physical activity, or an equivalent combination of moderate- and vigorous-intensity aerobic activity. Aerobic activity should be performed in episodes of at least 10 minutes, and preferably, it should be spread throughout the week.  Behavioral modification strategies: increasing lean protein intake and no skipping meals.  [name] has agreed to follow-up with our clinic in 3 weeks. She was informed of the importance of frequent follow-up visits to maximize her success with intensive lifestyle modifications for her multiple  health conditions.   Objective:   Blood pressure 133/86, pulse 84, temperature 98.6 F (37 C), temperature source Oral, height 5\' 5"  (1.651 m), weight 227 lb (103 kg), SpO2 99 %, unknown if currently breastfeeding. Body mass index is 37.77 kg/m.  General: Cooperative, alert, well developed, in no acute  distress. HEENT: Conjunctivae and lids unremarkable. Cardiovascular: Regular rhythm.  Lungs: Normal work of breathing. Neurologic: No focal deficits.   Lab Results  Component Value Date   CREATININE 0.69 01/29/2019   BUN 12 01/29/2019   NA 139 01/29/2019   K 4.7 01/29/2019   CL 102 01/29/2019   CO2 24 01/29/2019   Lab Results  Component Value Date   ALT 13 01/29/2019   AST 14 01/29/2019   ALKPHOS 55 01/29/2019   BILITOT 0.5 01/29/2019   Lab Results  Component Value Date   HGBA1C 5.8 (H) 01/29/2019   HGBA1C 5.7 (H) 08/30/2018   HGBA1C 5.8 (H) 12/27/2017   HGBA1C 6.0 (H) 09/27/2017   HGBA1C 5.9 (H) 09/30/2011   Lab Results  Component Value Date   INSULIN 20.0 01/29/2019   INSULIN 33.0 (H) 08/30/2018   INSULIN 26.2 (H) 02/06/2018   Lab Results  Component Value Date   TSH 1.870 09/13/2018   Lab Results  Component Value Date   CHOL 155 01/29/2019   HDL 49 01/29/2019   LDLCALC 94 01/29/2019   TRIG 58 01/29/2019   CHOLHDL 3.4 09/27/2017   Lab Results  Component Value Date   WBC 6.4 10/02/2018   HGB 11.8 10/02/2018   HCT 35.4 10/02/2018   MCV 86.1 10/02/2018   PLT 360 10/02/2018   Lab Results  Component Value Date   IRON 54 09/30/2017   TIBC 373 09/30/2017   FERRITIN 81 09/30/2017   Attestation Statements:   Reviewed by clinician on day of visit: allergies, medications, problem list, medical history, surgical history, family history, social history, and previous encounter notes.  IMichaelene Song, am acting as transcriptionist for Abby Potash, PA-C   I have reviewed the above documentation for accuracy and completeness, and I agree with the above. Abby Potash, PA-C

## 2019-04-24 ENCOUNTER — Ambulatory Visit: Payer: Self-pay | Attending: Family

## 2019-04-24 DIAGNOSIS — Z23 Encounter for immunization: Secondary | ICD-10-CM

## 2019-04-24 NOTE — Progress Notes (Signed)
   Covid-19 Vaccination Clinic  Name:  COURTNEE RAMBEAU    MRN: [id number] DOB: 1980-05-09  04/24/2019  Ms. Dlugos was observed post Covid-19 immunization for 15 minutes without incident. She was provided with Vaccine Information Sheet and instruction to access the V-Safe system.   Ms. Goedken was instructed to call 911 with any severe reactions post vaccine: Marland Kitchen Difficulty breathing  . Swelling of face and throat  . A fast heartbeat  . A bad rash all over body  . Dizziness and weakness   Immunizations Administered    Name Date Dose VIS Date Route   Moderna COVID-19 Vaccine 04/24/2019  9:56 AM 0.5 mL 12/19/2018 Intramuscular   Manufacturer: Moderna   Lot: [id number]   [id number]

## 2019-04-26 ENCOUNTER — Ambulatory Visit (INDEPENDENT_AMBULATORY_CARE_PROVIDER_SITE_OTHER): Payer: BC Managed Care – PPO | Admitting: Professional

## 2019-04-26 DIAGNOSIS — F331 Major depressive disorder, recurrent, moderate: Secondary | ICD-10-CM | POA: Diagnosis not present

## 2019-04-26 DIAGNOSIS — F411 Generalized anxiety disorder: Secondary | ICD-10-CM

## 2019-05-10 ENCOUNTER — Ambulatory Visit (INDEPENDENT_AMBULATORY_CARE_PROVIDER_SITE_OTHER): Payer: BC Managed Care – PPO | Admitting: Professional

## 2019-05-10 DIAGNOSIS — F411 Generalized anxiety disorder: Secondary | ICD-10-CM

## 2019-05-10 DIAGNOSIS — F331 Major depressive disorder, recurrent, moderate: Secondary | ICD-10-CM | POA: Diagnosis not present

## 2019-05-15 ENCOUNTER — Encounter (INDEPENDENT_AMBULATORY_CARE_PROVIDER_SITE_OTHER): Payer: Self-pay | Admitting: Physician Assistant

## 2019-05-15 ENCOUNTER — Other Ambulatory Visit: Payer: Self-pay

## 2019-05-15 ENCOUNTER — Ambulatory Visit (INDEPENDENT_AMBULATORY_CARE_PROVIDER_SITE_OTHER): Payer: BC Managed Care – PPO | Admitting: Physician Assistant

## 2019-05-15 VITALS — BP 106/69 | HR 85 | Temp 98.0°F | Ht 65.0 in | Wt 223.0 lb

## 2019-05-15 DIAGNOSIS — E559 Vitamin D deficiency, unspecified: Secondary | ICD-10-CM | POA: Diagnosis not present

## 2019-05-15 DIAGNOSIS — Z9189 Other specified personal risk factors, not elsewhere classified: Secondary | ICD-10-CM

## 2019-05-15 DIAGNOSIS — R7303 Prediabetes: Secondary | ICD-10-CM

## 2019-05-15 DIAGNOSIS — Z6837 Body mass index (BMI) 37.0-37.9, adult: Secondary | ICD-10-CM

## 2019-05-15 NOTE — Progress Notes (Signed)
Chief Complaint:   OBESITY [name] is here to discuss her progress with her obesity treatment plan along with follow-up of her obesity related diagnoses. [name] is on the Category 2 Plan and states she is following her eating plan approximately 75% of the time. [name] states she is walking 20-30 minutes 5 times per week.  Today's visit was #: 20 Starting weight: 233 lbs Starting date: 02/06/2018 Today's weight: 223 lbs Today's date: 05/15/2019 Total lbs lost to date: 10 Total lbs lost since last in-office visit: 4  Interim History: [name] reports that she ate more protein and drank more water. She has a girls' weekend this weekend.  Subjective:   Prediabetes. [name] has a diagnosis of prediabetes based on her elevated HgA1c and was informed this puts her at greater risk of developing diabetes. She continues to work on diet and exercise to decrease her risk of diabetes. She denies nausea or hypoglycemia. [name] is on metformin, but only taking 4 days a week on average.  Lab Results  Component Value Date   HGBA1C 5.8 (H) 01/29/2019   Lab Results  Component Value Date   INSULIN 20.0 01/29/2019   INSULIN 33.0 (H) 08/30/2018   INSULIN 26.2 (H) 02/06/2018   Vitamin D deficiency. [name] is on Vitamin D weekly. No nausea, vomiting, or muscle weakness. She is due for labs. Last Vitamin D 56.6 on 01/29/2019.  At risk for diabetes mellitus. [name] is at higher than average risk for developing diabetes due to her obesity.   Assessment/Plan:   Prediabetes.  [name] will continue to work on weight loss, exercise, and decreasing simple carbohydrates to help decrease the risk of diabetes.Will check labs and determine if she needs to continue metformin.  Comprehensive metabolic panel, Hemoglobin A1c, Insulin, random ordered today.  Vitamin D deficiency. Low Vitamin D level contributes to fatigue and are associated with obesity, breast, and colon cancer. She agrees to continue to  take Vitamin D and VITAMIN D 25 Hydroxy (Vit-D Deficiency, Fractures) level was ordered today.  At risk for diabetes mellitus. [name] was given approximately 15 minutes of diabetes education and counseling today. We discussed intensive lifestyle modifications today with an emphasis on weight loss as well as increasing exercise and decreasing simple carbohydrates in her diet. We also reviewed medication options with an emphasis on risk versus benefit of those discussed.   Repetitive spaced learning was employed today to elicit superior memory formation and behavioral change.  Class 2 severe obesity with serious comorbidity and body mass index (BMI) of 37.0 to 37.9 in adult, unspecified obesity type ([name]).  [name] is currently in the action stage of change. As such, her goal is to continue with weight loss efforts. She has agreed to the Category 2 Plan.   Exercise goals: For substantial health benefits, adults should do at least 150 minutes (2 hours and 30 minutes) a week of moderate-intensity, or 75 minutes (1 hour and 15 minutes) a week of vigorous-intensity aerobic physical activity, or an equivalent combination of moderate- and vigorous-intensity aerobic activity. Aerobic activity should be performed in episodes of at least 10 minutes, and preferably, it should be spread throughout the week.  Behavioral modification strategies: celebration eating strategies.  [name] has agreed to follow-up with our clinic in 3 weeks. She was informed of the importance of frequent follow-up visits to maximize her success with intensive lifestyle modifications for her multiple health conditions.   [name] was informed we would discuss her lab results at her  next visit unless there is a critical issue that needs to be addressed sooner. [name] agreed to keep her next visit at the agreed upon time to discuss these results.  Objective:   Blood pressure 106/69, pulse 85, temperature 98 F (36.7 C), temperature source  Oral, height 5\' 5"  (1.651 m), weight 223 lb (101.2 kg), last menstrual period 05/02/2019, SpO2 98 %, unknown if currently breastfeeding. Body mass index is 37.11 kg/m.  General: Cooperative, alert, well developed, in no acute distress. HEENT: Conjunctivae and lids unremarkable. Cardiovascular: Regular rhythm.  Lungs: Normal work of breathing. Neurologic: No focal deficits.   Lab Results  Component Value Date   CREATININE 0.69 01/29/2019   BUN 12 01/29/2019   NA 139 01/29/2019   K 4.7 01/29/2019   CL 102 01/29/2019   CO2 24 01/29/2019   Lab Results  Component Value Date   ALT 13 01/29/2019   AST 14 01/29/2019   ALKPHOS 55 01/29/2019   BILITOT 0.5 01/29/2019   Lab Results  Component Value Date   HGBA1C 5.8 (H) 01/29/2019   HGBA1C 5.7 (H) 08/30/2018   HGBA1C 5.8 (H) 12/27/2017   HGBA1C 6.0 (H) 09/27/2017   HGBA1C 5.9 (H) 09/30/2011   Lab Results  Component Value Date   INSULIN 20.0 01/29/2019   INSULIN 33.0 (H) 08/30/2018   INSULIN 26.2 (H) 02/06/2018   Lab Results  Component Value Date   TSH 1.870 09/13/2018   Lab Results  Component Value Date   CHOL 155 01/29/2019   HDL 49 01/29/2019   LDLCALC 94 01/29/2019   TRIG 58 01/29/2019   CHOLHDL 3.4 09/27/2017   Lab Results  Component Value Date   WBC 6.4 10/02/2018   HGB 11.8 10/02/2018   HCT 35.4 10/02/2018   MCV 86.1 10/02/2018   PLT 360 10/02/2018   Lab Results  Component Value Date   IRON 54 09/30/2017   TIBC 373 09/30/2017   FERRITIN 81 09/30/2017   Attestation Statements:   Reviewed by clinician on day of visit: allergies, medications, problem list, medical history, surgical history, family history, social history, and previous encounter notes.  IMichaelene Song, am acting as transcriptionist for Abby Potash, PA-C   I have reviewed the above documentation for accuracy and completeness, and I agree with the above. Abby Potash, PA-C

## 2019-05-16 LAB — INSULIN, RANDOM: INSULIN: 19.3 u[IU]/mL (ref 2.6–24.9)

## 2019-05-16 LAB — COMPREHENSIVE METABOLIC PANEL
ALT: 18 IU/L (ref 0–32)
AST: 16 IU/L (ref 0–40)
Albumin/Globulin Ratio: 1.7 (ref 1.2–2.2)
Albumin: 4.5 g/dL (ref 3.8–4.8)
Alkaline Phosphatase: 51 IU/L (ref 39–117)
BUN/Creatinine Ratio: 18 (ref 9–23)
BUN: 13 mg/dL (ref 6–20)
Bilirubin Total: 0.3 mg/dL (ref 0.0–1.2)
CO2: 23 mmol/L (ref 20–29)
Calcium: 9.3 mg/dL (ref 8.7–10.2)
Chloride: 104 mmol/L (ref 96–106)
Creatinine, Ser: 0.73 mg/dL (ref 0.57–1.00)
GFR calc Af Amer: 120 mL/min/{1.73_m2} (ref >59)
GFR calc non Af Amer: 104 mL/min/{1.73_m2} (ref >59)
Globulin, Total: 2.6 g/dL (ref 1.5–4.5)
Glucose: 103 mg/dL — ABNORMAL HIGH (ref 65–99)
Potassium: 4.6 mmol/L (ref 3.5–5.2)
Sodium: 138 mmol/L (ref 134–144)
Total Protein: 7.1 g/dL (ref 6.0–8.5)

## 2019-05-16 LAB — HEMOGLOBIN A1C
Est. average glucose Bld gHb Est-mCnc: 117 mg/dL
Hgb A1c MFr Bld: 5.7 % — ABNORMAL HIGH (ref 4.8–5.6)

## 2019-05-16 LAB — VITAMIN D 25 HYDROXY (VIT D DEFICIENCY, FRACTURES): Vit D, 25-Hydroxy: 88.8 ng/mL (ref 30.0–100.0)

## 2019-05-22 ENCOUNTER — Ambulatory Visit (INDEPENDENT_AMBULATORY_CARE_PROVIDER_SITE_OTHER): Payer: BC Managed Care – PPO | Admitting: Professional

## 2019-05-22 DIAGNOSIS — F411 Generalized anxiety disorder: Secondary | ICD-10-CM | POA: Diagnosis not present

## 2019-05-22 DIAGNOSIS — F331 Major depressive disorder, recurrent, moderate: Secondary | ICD-10-CM | POA: Diagnosis not present

## 2019-06-05 ENCOUNTER — Other Ambulatory Visit: Payer: Self-pay

## 2019-06-05 ENCOUNTER — Ambulatory Visit (INDEPENDENT_AMBULATORY_CARE_PROVIDER_SITE_OTHER): Payer: BC Managed Care – PPO | Admitting: Physician Assistant

## 2019-06-05 ENCOUNTER — Encounter (INDEPENDENT_AMBULATORY_CARE_PROVIDER_SITE_OTHER): Payer: Self-pay | Admitting: Physician Assistant

## 2019-06-05 VITALS — BP 114/76 | HR 81 | Temp 98.5°F | Ht 65.0 in | Wt 220.0 lb

## 2019-06-05 DIAGNOSIS — Z6836 Body mass index (BMI) 36.0-36.9, adult: Secondary | ICD-10-CM

## 2019-06-05 DIAGNOSIS — Z9189 Other specified personal risk factors, not elsewhere classified: Secondary | ICD-10-CM | POA: Diagnosis not present

## 2019-06-05 DIAGNOSIS — E559 Vitamin D deficiency, unspecified: Secondary | ICD-10-CM

## 2019-06-05 DIAGNOSIS — R7303 Prediabetes: Secondary | ICD-10-CM | POA: Diagnosis not present

## 2019-06-05 MED ORDER — CHOLECALCIFEROL 1.25 MG (50000 UT) PO TABS: ORAL_TABLET | ORAL | 0 refills | Status: DC

## 2019-06-05 NOTE — Progress Notes (Signed)
Chief Complaint:   OBESITY [name] is here to discuss her progress with her obesity treatment plan along with follow-up of her obesity related diagnoses. [name] is on the Category 2 Plan and states she is following her eating plan approximately 90% of the time. [name] states she is walking for 20-30 minutes 2-3 times per week.  Today's visit was #: 21 Starting weight: 233 lbs Starting date: 02/06/2018 Today's weight: 220 lbs Today's date: 06/05/2019 Total lbs lost to date: 13 Total lbs lost since last in-office visit: 3  Interim History: [name] doesn't think she is getting all of her snack calories in on days she goes into the office. She is not drinking enough water.  Subjective:   1. Vitamin D deficiency [name] is on Vit D weekly, and she denies nausea, vomiting, or muscle weakness. She is walking outside regularly.  2. Pre-diabetes [name] is on metformin and denies nausea, vomiting, diarrhea, or polyphagia.  3. At risk for osteoporosis [name] is at higher risk of osteopenia and osteoporosis due to Vitamin D deficiency.   Assessment/Plan:   1. Vitamin D deficiency Low Vitamin D level contributes to fatigue and are associated with obesity, breast, and colon cancer. We will refill prescription Vitamin D for 1 month. [name] will follow-up for routine testing of Vitamin D, at least 2-3 times per year to avoid over-replacement.  - Cholecalciferol 1.25 MG (50000 UT) TABS; 1 tablet weekly  Dispense: 4 tablet; Refill: 0  2. Pre-diabetes [name] will continue metformin, and will continue to work on weight loss, exercise, and decreasing simple carbohydrates to help decrease the risk of diabetes.   3. At risk for osteoporosis [name] was given approximately 15 minutes of osteoporosis prevention counseling today. [name] is at risk for osteopenia and osteoporosis due to her Vitamin D deficiency. She was encouraged to take her Vitamin D and follow her higher calcium diet and increase  strengthening exercise to help strengthen her bones and decrease her risk of osteopenia and osteoporosis.  Repetitive spaced learning was employed today to elicit superior memory formation and behavioral change.  4. Class 2 severe obesity with serious comorbidity and body mass index (BMI) of 36.0 to 36.9 in adult, unspecified obesity type ([facility]) [name] is currently in the action stage of change. As such, her goal is to continue with weight loss efforts. She has agreed to the Category 2 Plan.   Exercise goals: As is.  Behavioral modification strategies: increasing water intake, meal planning and cooking strategies and keeping healthy foods in the home.  [name] has agreed to follow-up with our clinic in 3 weeks. She was informed of the importance of frequent follow-up visits to maximize her success with intensive lifestyle modifications for her multiple health conditions.   Objective:   Blood pressure 114/76, pulse 81, temperature 98.5 F (36.9 C), temperature source Oral, height 5\' 5"  (1.651 m), weight 220 lb (99.8 kg), SpO2 99 %, unknown if currently breastfeeding. Body mass index is 36.61 kg/m.  General: Cooperative, alert, well developed, in no acute distress. HEENT: Conjunctivae and lids unremarkable. Cardiovascular: Regular rhythm.  Lungs: Normal work of breathing. Neurologic: No focal deficits.   Lab Results  Component Value Date   CREATININE 0.73 05/15/2019   BUN 13 05/15/2019   NA 138 05/15/2019   K 4.6 05/15/2019   CL 104 05/15/2019   CO2 23 05/15/2019   Lab Results  Component Value Date   ALT 18 05/15/2019   AST 16 05/15/2019   ALKPHOS 51 05/15/2019  BILITOT 0.3 05/15/2019   Lab Results  Component Value Date   HGBA1C 5.7 (H) 05/15/2019   HGBA1C 5.8 (H) 01/29/2019   HGBA1C 5.7 (H) 08/30/2018   HGBA1C 5.8 (H) 12/27/2017   HGBA1C 6.0 (H) 09/27/2017   Lab Results  Component Value Date   INSULIN 19.3 05/15/2019   INSULIN 20.0 01/29/2019   INSULIN 33.0 (H)  08/30/2018   INSULIN 26.2 (H) 02/06/2018   Lab Results  Component Value Date   TSH 1.870 09/13/2018   Lab Results  Component Value Date   CHOL 155 01/29/2019   HDL 49 01/29/2019   LDLCALC 94 01/29/2019   TRIG 58 01/29/2019   CHOLHDL 3.4 09/27/2017   Lab Results  Component Value Date   WBC 6.4 10/02/2018   HGB 11.8 10/02/2018   HCT 35.4 10/02/2018   MCV 86.1 10/02/2018   PLT 360 10/02/2018   Lab Results  Component Value Date   IRON 54 09/30/2017   TIBC 373 09/30/2017   FERRITIN 81 09/30/2017   Attestation Statements:   Reviewed by clinician on day of visit: allergies, medications, problem list, medical history, surgical history, family history, social history, and previous encounter notes.   Wilhemena Durie, am acting as transcriptionist for [employer], PA-C.  I have reviewed the above documentation for accuracy and completeness, and I agree with the above. Abby Potash, PA-C

## 2019-06-07 ENCOUNTER — Ambulatory Visit (INDEPENDENT_AMBULATORY_CARE_PROVIDER_SITE_OTHER): Payer: BC Managed Care – PPO | Admitting: Professional

## 2019-06-07 DIAGNOSIS — F411 Generalized anxiety disorder: Secondary | ICD-10-CM | POA: Diagnosis not present

## 2019-06-07 DIAGNOSIS — F331 Major depressive disorder, recurrent, moderate: Secondary | ICD-10-CM | POA: Diagnosis not present

## 2019-06-21 ENCOUNTER — Ambulatory Visit: Payer: BC Managed Care – PPO | Admitting: Professional

## 2019-06-26 ENCOUNTER — Ambulatory Visit (INDEPENDENT_AMBULATORY_CARE_PROVIDER_SITE_OTHER): Payer: BC Managed Care – PPO | Admitting: Professional

## 2019-06-26 DIAGNOSIS — F331 Major depressive disorder, recurrent, moderate: Secondary | ICD-10-CM | POA: Diagnosis not present

## 2019-06-26 DIAGNOSIS — F411 Generalized anxiety disorder: Secondary | ICD-10-CM

## 2019-06-28 ENCOUNTER — Other Ambulatory Visit: Payer: Self-pay

## 2019-06-28 ENCOUNTER — Encounter (INDEPENDENT_AMBULATORY_CARE_PROVIDER_SITE_OTHER): Payer: Self-pay | Admitting: Physician Assistant

## 2019-06-28 ENCOUNTER — Ambulatory Visit (INDEPENDENT_AMBULATORY_CARE_PROVIDER_SITE_OTHER): Payer: BC Managed Care – PPO | Admitting: Physician Assistant

## 2019-06-28 VITALS — BP 99/70 | HR 85 | Temp 98.2°F | Ht 65.0 in | Wt 220.0 lb

## 2019-06-28 DIAGNOSIS — F3289 Other specified depressive episodes: Secondary | ICD-10-CM

## 2019-06-28 DIAGNOSIS — R7303 Prediabetes: Secondary | ICD-10-CM | POA: Diagnosis not present

## 2019-06-28 DIAGNOSIS — Z9189 Other specified personal risk factors, not elsewhere classified: Secondary | ICD-10-CM

## 2019-06-28 DIAGNOSIS — Z6836 Body mass index (BMI) 36.0-36.9, adult: Secondary | ICD-10-CM

## 2019-06-28 MED ORDER — BUPROPION HCL ER (SR) 150 MG PO TB12: 150.0000 mg | ORAL_TABLET | Freq: Two times a day (BID) | ORAL | 0 refills | Status: DC

## 2019-06-28 NOTE — Progress Notes (Signed)
Chief Complaint:   OBESITY [name] is here to discuss her progress with her obesity treatment plan along with follow-up of her obesity related diagnoses. [name] is on the Category 2 Plan and states she is following her eating plan approximately 90% of the time. [name] states she is walking 20 minutes 4 times per week.  Today's visit was #: 22 Starting weight: 233 lbs Starting date: 02/06/2018 Today's weight: 220 lbs Today's date: 06/28/2019 Total lbs lost to date: 13 Total lbs lost since last in-office visit: 0  Interim History: [name] states that after her last visit she had her period, which greatly affected her cravings. She has also been taking her Wellbutrin once daily instead of twice daily as prescribed. She is traveling to the beach in 2 weeks.  Subjective:   Other depression, with emotional eating. [name] is struggling with emotional eating and using food for comfort to the extent that it is negatively impacting her health. She has been working on behavior modification techniques to help reduce her emotional eating and has been somewhat successful. She shows no sign of suicidal or homicidal ideations. [name] is on bupropion. She reports cravings during her period. Blood pressure is controlled.  Prediabetes. [name] has a diagnosis of prediabetes based on her elevated HgA1c and was informed this puts her at greater risk of developing diabetes. She continues to work on diet and exercise to decrease her risk of diabetes. No nausea, vomiting, diarrhea, or polyphagia. [name] is not taking metformin due to side effects.  Lab Results  Component Value Date   HGBA1C 5.7 (H) 05/15/2019   Lab Results  Component Value Date   INSULIN 19.3 05/15/2019   INSULIN 20.0 01/29/2019   INSULIN 33.0 (H) 08/30/2018   INSULIN 26.2 (H) 02/06/2018   At risk for diabetes mellitus. [name] is at higher than average risk for developing diabetes due to her obesity.   Assessment/Plan:    Other depression, with emotional eating. Behavior modification techniques were discussed today to help Jniyah deal with her emotional/non-hunger eating behaviors.  Orders and follow up as documented in patient record. Refill was given for buPROPion (WELLBUTRIN SR) 150 MG 12 hr tablet #60 with 0 refills.  Prediabetes. [name] will continue to work on weight loss, exercise, and decreasing simple carbohydrates to help decrease the risk of diabetes. We discussed Victoza as an option.  At risk for diabetes mellitus. [name] was given approximately 15 minutes of diabetes education and counseling today. We discussed intensive lifestyle modifications today with an emphasis on weight loss as well as increasing exercise and decreasing simple carbohydrates in her diet. We also reviewed medication options with an emphasis on risk versus benefit of those discussed.   Repetitive spaced learning was employed today to elicit superior memory formation and behavioral change.  Class 2 severe obesity with serious comorbidity and body mass index (BMI) of 36.0 to 36.9 in adult, unspecified obesity type ([place]).  [name] is currently in the action stage of change. As such, her goal is to continue with weight loss efforts. She has agreed to the Category 2 Plan.   Exercise goals: For substantial health benefits, adults should do at least 150 minutes (2 hours and 30 minutes) a week of moderate-intensity, or 75 minutes (1 hour and 15 minutes) a week of vigorous-intensity aerobic physical activity, or an equivalent combination of moderate- and vigorous-intensity aerobic activity. Aerobic activity should be performed in episodes of at least 10 minutes, and preferably, it should be spread  throughout the week.  Behavioral modification strategies: meal planning and cooking strategies and keeping healthy foods in the home.  [name] has agreed to follow-up with our clinic in 3 weeks. She was informed of the importance of frequent  follow-up visits to maximize her success with intensive lifestyle modifications for her multiple health conditions.   Objective:   Blood pressure 99/70, pulse 85, temperature 98.2 F (36.8 C), temperature source Oral, height 5\' 5"  (1.651 m), weight 220 lb (99.8 kg), SpO2 99 %, unknown if currently breastfeeding. Body mass index is 36.61 kg/m.  General: Cooperative, alert, well developed, in no acute distress. HEENT: Conjunctivae and lids unremarkable. Cardiovascular: Regular rhythm.  Lungs: Normal work of breathing. Neurologic: No focal deficits.   Lab Results  Component Value Date   CREATININE 0.73 05/15/2019   BUN 13 05/15/2019   NA 138 05/15/2019   K 4.6 05/15/2019   CL 104 05/15/2019   CO2 23 05/15/2019   Lab Results  Component Value Date   ALT 18 05/15/2019   AST 16 05/15/2019   ALKPHOS 51 05/15/2019   BILITOT 0.3 05/15/2019   Lab Results  Component Value Date   HGBA1C 5.7 (H) 05/15/2019   HGBA1C 5.8 (H) 01/29/2019   HGBA1C 5.7 (H) 08/30/2018   HGBA1C 5.8 (H) 12/27/2017   HGBA1C 6.0 (H) 09/27/2017   Lab Results  Component Value Date   INSULIN 19.3 05/15/2019   INSULIN 20.0 01/29/2019   INSULIN 33.0 (H) 08/30/2018   INSULIN 26.2 (H) 02/06/2018   Lab Results  Component Value Date   TSH 1.870 09/13/2018   Lab Results  Component Value Date   CHOL 155 01/29/2019   HDL 49 01/29/2019   LDLCALC 94 01/29/2019   TRIG 58 01/29/2019   CHOLHDL 3.4 09/27/2017   Lab Results  Component Value Date   WBC 6.4 10/02/2018   HGB 11.8 10/02/2018   HCT 35.4 10/02/2018   MCV 86.1 10/02/2018   PLT 360 10/02/2018   Lab Results  Component Value Date   IRON 54 09/30/2017   TIBC 373 09/30/2017   FERRITIN 81 09/30/2017   Attestation Statements:   Reviewed by clinician on day of visit: allergies, medications, problem list, medical history, surgical history, family history, social history, and previous encounter notes.  IMichaelene Song, am acting as transcriptionist for  Abby Potash, PA-C   I have reviewed the above documentation for accuracy and completeness, and I agree with the above. -  .mecc

## 2019-07-12 ENCOUNTER — Other Ambulatory Visit (INDEPENDENT_AMBULATORY_CARE_PROVIDER_SITE_OTHER): Payer: Self-pay | Admitting: Physician Assistant

## 2019-07-12 DIAGNOSIS — F3289 Other specified depressive episodes: Secondary | ICD-10-CM

## 2019-07-25 ENCOUNTER — Encounter (INDEPENDENT_AMBULATORY_CARE_PROVIDER_SITE_OTHER): Payer: Self-pay | Admitting: Physician Assistant

## 2019-07-25 ENCOUNTER — Other Ambulatory Visit: Payer: Self-pay

## 2019-07-25 ENCOUNTER — Ambulatory Visit (INDEPENDENT_AMBULATORY_CARE_PROVIDER_SITE_OTHER): Payer: BC Managed Care – PPO | Admitting: Physician Assistant

## 2019-07-25 VITALS — BP 128/85 | HR 71 | Temp 98.2°F | Ht 65.0 in | Wt 223.0 lb

## 2019-07-25 DIAGNOSIS — Z6837 Body mass index (BMI) 37.0-37.9, adult: Secondary | ICD-10-CM

## 2019-07-25 DIAGNOSIS — F3289 Other specified depressive episodes: Secondary | ICD-10-CM | POA: Diagnosis not present

## 2019-07-25 DIAGNOSIS — Z9189 Other specified personal risk factors, not elsewhere classified: Secondary | ICD-10-CM

## 2019-07-25 DIAGNOSIS — E559 Vitamin D deficiency, unspecified: Secondary | ICD-10-CM | POA: Diagnosis not present

## 2019-07-25 MED ORDER — CHOLECALCIFEROL 1.25 MG (50000 UT) PO TABS: 1.0000 | ORAL_TABLET | ORAL | 0 refills | Status: DC

## 2019-07-25 NOTE — Progress Notes (Signed)
Chief Complaint:   OBESITY [name] is here to discuss her progress with her obesity treatment plan along with follow-up of her obesity related diagnoses. [name] is on the Category 2 Plan and states she is following her eating plan approximately 0% of the time. [name] states she is exercising 0 minutes 0 times per week.  Today's visit was #: 23 Starting weight: 233 lbs Starting date: 02/06/2018 Today's weight: 223 lbs Today's date: 07/25/2019 Total lbs lost to date: 10 Total lbs lost since last in-office visit: 0  Interim History: [name] reports a great deal of stress recently. She is not eating enough food on some days and stress eating on others.  Subjective:   Vitamin D deficiency. [name] is on prescription Vitamin D supplementation. No nausea, vomiting, or muscle weakness.    Ref. Range 05/15/2019 12:36  Vitamin D, 25-Hydroxy Latest Ref Range: 30.0 - 100.0 ng/mL 88.8   Other depression, with emotional eating. [name] is struggling with emotional eating and using food for comfort to the extent that it is negatively impacting her health. She has been working on behavior modification techniques to help reduce her emotional eating and has been somewhat successful. She shows no sign of suicidal or homicidal ideations. [name] is on bupropion. Blood pressure is normal.  At risk for osteoporosis. [name] is at higher risk of osteopenia and osteoporosis due to Vitamin D deficiency.   Assessment/Plan:   Vitamin D deficiency. Low Vitamin D level contributes to fatigue and are associated with obesity, breast, and colon cancer. She was given a refill on her Cholecalciferol 1.25 MG (50000 UT) TABS every other week #2 with 0 refills and will follow-up for routine testing of Vitamin D, at least 2-3 times per year to avoid over-replacement.   Other depression, with emotional eating. Behavior modification techniques were discussed today to help [name] deal with her emotional/non-hunger  eating behaviors.  Orders and follow up as documented in patient record. [name] will continue her medication as directed.   At risk for osteoporosis. [name] was given approximately 15 minutes of osteoporosis prevention counseling today. [name] is at risk for osteopenia and osteoporosis due to her Vitamin D deficiency. She was encouraged to take her Vitamin D and follow her higher calcium diet and increase strengthening exercise to help strengthen her bones and decrease her risk of osteopenia and osteoporosis.  Repetitive spaced learning was employed today to elicit superior memory formation and behavioral change.  Class 2 severe obesity with serious comorbidity and body mass index (BMI) of 37.0 to 37.9 in adult, unspecified obesity type ([place]).  [name] is currently in the action stage of change. As such, her goal is to continue with weight loss efforts. She has agreed to the Category 2 Plan.   Exercise goals: For substantial health benefits, adults should do at least 150 minutes (2 hours and 30 minutes) a week of moderate-intensity, or 75 minutes (1 hour and 15 minutes) a week of vigorous-intensity aerobic physical activity, or an equivalent combination of moderate- and vigorous-intensity aerobic activity. Aerobic activity should be performed in episodes of at least 10 minutes, and preferably, it should be spread throughout the week.  Behavioral modification strategies: increasing lean protein intake and no skipping meals.  [name] has agreed to follow-up with our clinic in 2 weeks. She was informed of the importance of frequent follow-up visits to maximize her success with intensive lifestyle modifications for her multiple health conditions.   Objective:   Blood pressure 128/85, pulse 71,  temperature 98.2 F (36.8 C), temperature source Oral, height 5\' 5"  (1.651 m), weight 223 lb (101.2 kg), SpO2 99 %, unknown if currently breastfeeding. Body mass index is 37.11 kg/m.  General: Cooperative,  alert, well developed, in no acute distress. HEENT: Conjunctivae and lids unremarkable. Cardiovascular: Regular rhythm.  Lungs: Normal work of breathing. Neurologic: No focal deficits.   Lab Results  Component Value Date   CREATININE 0.73 05/15/2019   BUN 13 05/15/2019   NA 138 05/15/2019   K 4.6 05/15/2019   CL 104 05/15/2019   CO2 23 05/15/2019   Lab Results  Component Value Date   ALT 18 05/15/2019   AST 16 05/15/2019   ALKPHOS 51 05/15/2019   BILITOT 0.3 05/15/2019   Lab Results  Component Value Date   HGBA1C 5.7 (H) 05/15/2019   HGBA1C 5.8 (H) 01/29/2019   HGBA1C 5.7 (H) 08/30/2018   HGBA1C 5.8 (H) 12/27/2017   HGBA1C 6.0 (H) 09/27/2017   Lab Results  Component Value Date   INSULIN 19.3 05/15/2019   INSULIN 20.0 01/29/2019   INSULIN 33.0 (H) 08/30/2018   INSULIN 26.2 (H) 02/06/2018   Lab Results  Component Value Date   TSH 1.870 09/13/2018   Lab Results  Component Value Date   CHOL 155 01/29/2019   HDL 49 01/29/2019   LDLCALC 94 01/29/2019   TRIG 58 01/29/2019   CHOLHDL 3.4 09/27/2017   Lab Results  Component Value Date   WBC 6.4 10/02/2018   HGB 11.8 10/02/2018   HCT 35.4 10/02/2018   MCV 86.1 10/02/2018   PLT 360 10/02/2018   Lab Results  Component Value Date   IRON 54 09/30/2017   TIBC 373 09/30/2017   FERRITIN 81 09/30/2017   Attestation Statements:   Reviewed by clinician on day of visit: allergies, medications, problem list, medical history, surgical history, family history, social history, and previous encounter notes.  IMichaelene Song, am acting as transcriptionist for Abby Potash, PA-C   I have reviewed the above documentation for accuracy and completeness, and I agree with the above. Abby Potash, PA-C

## 2019-07-26 ENCOUNTER — Ambulatory Visit (INDEPENDENT_AMBULATORY_CARE_PROVIDER_SITE_OTHER): Payer: BC Managed Care – PPO | Admitting: Professional

## 2019-07-26 DIAGNOSIS — F411 Generalized anxiety disorder: Secondary | ICD-10-CM

## 2019-07-26 DIAGNOSIS — F331 Major depressive disorder, recurrent, moderate: Secondary | ICD-10-CM

## 2019-08-02 ENCOUNTER — Ambulatory Visit: Payer: BC Managed Care – PPO | Admitting: Professional

## 2019-08-13 ENCOUNTER — Ambulatory Visit (INDEPENDENT_AMBULATORY_CARE_PROVIDER_SITE_OTHER): Payer: BC Managed Care – PPO | Admitting: Physician Assistant

## 2019-08-15 ENCOUNTER — Encounter (INDEPENDENT_AMBULATORY_CARE_PROVIDER_SITE_OTHER): Payer: Self-pay | Admitting: Physician Assistant

## 2019-08-16 ENCOUNTER — Other Ambulatory Visit (INDEPENDENT_AMBULATORY_CARE_PROVIDER_SITE_OTHER): Payer: Self-pay

## 2019-08-16 ENCOUNTER — Ambulatory Visit: Payer: BC Managed Care – PPO | Admitting: Professional

## 2019-08-16 DIAGNOSIS — F3289 Other specified depressive episodes: Secondary | ICD-10-CM

## 2019-08-16 MED ORDER — BUPROPION HCL ER (SR) 150 MG PO TB12: 150.0000 mg | ORAL_TABLET | Freq: Two times a day (BID) | ORAL | 0 refills | Status: DC

## 2019-08-16 NOTE — Telephone Encounter (Signed)
Please advise 

## 2019-08-16 NOTE — Telephone Encounter (Signed)
Yes ok to send enough until next visit. thanks

## 2019-08-23 ENCOUNTER — Encounter (INDEPENDENT_AMBULATORY_CARE_PROVIDER_SITE_OTHER): Payer: Self-pay | Admitting: Physician Assistant

## 2019-08-23 ENCOUNTER — Other Ambulatory Visit: Payer: Self-pay

## 2019-08-23 ENCOUNTER — Ambulatory Visit (INDEPENDENT_AMBULATORY_CARE_PROVIDER_SITE_OTHER): Payer: BC Managed Care – PPO | Admitting: Physician Assistant

## 2019-08-23 VITALS — BP 118/84 | HR 95 | Temp 98.4°F | Ht 65.0 in | Wt 225.0 lb

## 2019-08-23 DIAGNOSIS — Z9189 Other specified personal risk factors, not elsewhere classified: Secondary | ICD-10-CM | POA: Diagnosis not present

## 2019-08-23 DIAGNOSIS — Z6837 Body mass index (BMI) 37.0-37.9, adult: Secondary | ICD-10-CM

## 2019-08-23 DIAGNOSIS — F3289 Other specified depressive episodes: Secondary | ICD-10-CM | POA: Diagnosis not present

## 2019-08-23 DIAGNOSIS — E559 Vitamin D deficiency, unspecified: Secondary | ICD-10-CM | POA: Diagnosis not present

## 2019-08-23 MED ORDER — CHOLECALCIFEROL 1.25 MG (50000 UT) PO TABS: 1.0000 | ORAL_TABLET | ORAL | 0 refills | Status: DC

## 2019-08-23 MED ORDER — BUPROPION HCL ER (SR) 150 MG PO TB12: 150.0000 mg | ORAL_TABLET | Freq: Two times a day (BID) | ORAL | 0 refills | Status: DC

## 2019-08-27 NOTE — Progress Notes (Signed)
Chief Complaint:   OBESITY Courtney KNAPPER is here to discuss her progress with her obesity treatment plan along with follow-up of her obesity related diagnoses. [name] is on the Category 2 Plan and states she is following her eating plan approximately 15-20% of the time. [name] states she is doing aerobics 60 minutes 4 times per week and walking 15-20 minutes 4 times per week.  Today's visit was #: 24 Starting weight: 233 lbs Starting date: 02/06/2018 Today's weight: 225 lbs Today's date: 08/23/2019 Total lbs lost to date: 8 Total lbs lost since last in-office visit: 0  Interim History: [name] reports that she is having trouble getting motivated to stay on plan. She states that she has "no willpower" and feels as if she needs a "reset."  Subjective:   Vitamin D deficiency. [name] is on prescription strength Vitamin D supplementation and denies nausea, vomiting, or muscle weakness.    Ref. Range 05/15/2019 12:36  Vitamin D, 25-Hydroxy Latest Ref Range: 30.0 - 100.0 ng/mL 88.8   Other depression, with emotional eating. [name] is struggling with emotional eating and using food for comfort to the extent that it is negatively impacting her health. She has been working on behavior modification techniques to help reduce her emotional eating and has been somewhat successful. She shows no sign of suicidal or homicidal ideations. [name] is on bupropion.  At risk for osteoporosis. [name] is at higher risk of osteopenia and osteoporosis due to Vitamin D deficiency.   Assessment/Plan:   Vitamin D deficiency. Low Vitamin D level contributes to fatigue and are associated with obesity, breast, and colon cancer. She was given a refill on her Cholecalciferol 1.25 MG (50000 UT) TABS every other week #2 with 0 refills and will follow-up for routine testing of Vitamin D, at least 2-3 times per year to avoid over-replacement.   Other depression, with emotional eating. Behavior modification techniques  were discussed today to help [name] deal with her emotional/non-hunger eating behaviors.  Orders and follow up as documented in patient record. Refill was given for buPROPion (WELLBUTRIN SR) 150 MG 12 hr tablet #60 with 0 refills  At risk for osteoporosis. [name] was given approximately 15 minutes of osteoporosis prevention counseling today. [name] is at risk for osteopenia and osteoporosis due to her Vitamin D deficiency. She was encouraged to take her Vitamin D and follow her higher calcium diet and increase strengthening exercise to help strengthen her bones and decrease her risk of osteopenia and osteoporosis.  Repetitive spaced learning was employed today to elicit superior memory formation and behavioral change.  Class 2 severe obesity with serious comorbidity and body mass index (BMI) of 37.0 to 37.9 in adult, unspecified obesity type ([place]).  [name] is currently in the action stage of change. As such, her goal is to continue with weight loss efforts. She has agreed to keeping a food journal and adhering to recommended goals of 1200-1300 calories and 85 grams of protein daily.   Exercise goals: For substantial health benefits, adults should do at least 150 minutes (2 hours and 30 minutes) a week of moderate-intensity, or 75 minutes (1 hour and 15 minutes) a week of vigorous-intensity aerobic physical activity, or an equivalent combination of moderate- and vigorous-intensity aerobic activity. Aerobic activity should be performed in episodes of at least 10 minutes, and preferably, it should be spread throughout the week.  Behavioral modification strategies: increasing lean protein intake and meal planning and cooking strategies.  [name] has agreed to follow-up with our  clinic in 2-3 weeks. She was informed of the importance of frequent follow-up visits to maximize her success with intensive lifestyle modifications for her multiple health conditions.   Objective:   Blood pressure 118/84, pulse  95, temperature 98.4 F (36.9 C), temperature source Oral, height 5\' 5"  (1.651 m), weight 225 lb (102.1 kg), SpO2 96 %, unknown if currently breastfeeding. Body mass index is 37.44 kg/m.  General: Cooperative, alert, well developed, in no acute distress. HEENT: Conjunctivae and lids unremarkable. Cardiovascular: Regular rhythm.  Lungs: Normal work of breathing. Neurologic: No focal deficits.   Lab Results  Component Value Date   CREATININE 0.73 05/15/2019   BUN 13 05/15/2019   NA 138 05/15/2019   K 4.6 05/15/2019   CL 104 05/15/2019   CO2 23 05/15/2019   Lab Results  Component Value Date   ALT 18 05/15/2019   AST 16 05/15/2019   ALKPHOS 51 05/15/2019   BILITOT 0.3 05/15/2019   Lab Results  Component Value Date   HGBA1C 5.7 (H) 05/15/2019   HGBA1C 5.8 (H) 01/29/2019   HGBA1C 5.7 (H) 08/30/2018   HGBA1C 5.8 (H) 12/27/2017   HGBA1C 6.0 (H) 09/27/2017   Lab Results  Component Value Date   INSULIN 19.3 05/15/2019   INSULIN 20.0 01/29/2019   INSULIN 33.0 (H) 08/30/2018   INSULIN 26.2 (H) 02/06/2018   Lab Results  Component Value Date   TSH 1.870 09/13/2018   Lab Results  Component Value Date   CHOL 155 01/29/2019   HDL 49 01/29/2019   LDLCALC 94 01/29/2019   TRIG 58 01/29/2019   CHOLHDL 3.4 09/27/2017   Lab Results  Component Value Date   WBC 6.4 10/02/2018   HGB 11.8 10/02/2018   HCT 35.4 10/02/2018   MCV 86.1 10/02/2018   PLT 360 10/02/2018   Lab Results  Component Value Date   IRON 54 09/30/2017   TIBC 373 09/30/2017   FERRITIN 81 09/30/2017   Attestation Statements:   Reviewed by clinician on day of visit: allergies, medications, problem list, medical history, surgical history, family history, social history, and previous encounter notes.  IMichaelene Song, am acting as transcriptionist for Abby Potash, PA-C   I have reviewed the above documentation for accuracy and completeness, and I agree with the above. Abby Potash, PA-C

## 2019-09-12 ENCOUNTER — Other Ambulatory Visit: Payer: Self-pay

## 2019-09-12 ENCOUNTER — Encounter (INDEPENDENT_AMBULATORY_CARE_PROVIDER_SITE_OTHER): Payer: Self-pay | Admitting: Physician Assistant

## 2019-09-12 ENCOUNTER — Ambulatory Visit (INDEPENDENT_AMBULATORY_CARE_PROVIDER_SITE_OTHER): Payer: BC Managed Care – PPO | Admitting: Professional

## 2019-09-12 ENCOUNTER — Ambulatory Visit (INDEPENDENT_AMBULATORY_CARE_PROVIDER_SITE_OTHER): Payer: BC Managed Care – PPO | Admitting: Physician Assistant

## 2019-09-12 VITALS — BP 110/78 | HR 85 | Temp 98.3°F | Ht 65.0 in | Wt 228.0 lb

## 2019-09-12 DIAGNOSIS — F411 Generalized anxiety disorder: Secondary | ICD-10-CM | POA: Diagnosis not present

## 2019-09-12 DIAGNOSIS — Z6837 Body mass index (BMI) 37.0-37.9, adult: Secondary | ICD-10-CM | POA: Diagnosis not present

## 2019-09-12 DIAGNOSIS — F331 Major depressive disorder, recurrent, moderate: Secondary | ICD-10-CM

## 2019-09-12 DIAGNOSIS — E559 Vitamin D deficiency, unspecified: Secondary | ICD-10-CM | POA: Diagnosis not present

## 2019-09-12 DIAGNOSIS — R7303 Prediabetes: Secondary | ICD-10-CM | POA: Diagnosis not present

## 2019-09-12 DIAGNOSIS — E66812 Obesity, class 2: Secondary | ICD-10-CM

## 2019-09-12 NOTE — Progress Notes (Signed)
Chief Complaint:   OBESITY [name] is here to discuss her progress with her obesity treatment plan along with follow-up of her obesity related diagnoses. [name] is keeping a food journal and adhering to recommended goals of 1200-1300 calories and 85 grams of protein and states she is following her eating plan approximately 20% of the time. [name] states she is exercising 0 minutes 0 times per week.  Today's visit was #: 25 Starting weight: 233 lbs Starting date: 02/06/2018 Today's weight: 228 lbs Today's date: 09/12/2019 Total lbs lost to date: 5 Total lbs lost since last in-office visit: 0  Interim History: [name] states that she has not been journaling and is not motivated to follow on plan. She began having some reflux symptoms and states that she is not feeling well since she has not been eating well.  Subjective:   Prediabetes. [name] has a diagnosis of prediabetes based on her elevated HgA1c and was informed this puts her at greater risk of developing diabetes. She continues to work on diet and exercise to decrease her risk of diabetes. She denies nausea or hypoglycemia. [name] is on no medication. No polyphagia. She is due for labs.  Lab Results  Component Value Date   HGBA1C 5.7 (H) 05/15/2019   Lab Results  Component Value Date   INSULIN 19.3 05/15/2019   INSULIN 20.0 01/29/2019   INSULIN 33.0 (H) 08/30/2018   INSULIN 26.2 (H) 02/06/2018   Vitamin D deficiency. Shirl is on Vitamin D supplementation. No nausea, vomiting, or muscle weakness.    Ref. Range 05/15/2019 12:36  Vitamin D, 25-Hydroxy Latest Ref Range: 30.0 - 100.0 ng/mL 88.8   Assessment/Plan:   Prediabetes. [name] will continue to work on weight loss, exercise, and decreasing simple carbohydrates to help decrease the risk of diabetes.   Vitamin D deficiency. Low Vitamin D level contributes to fatigue and are associated with obesity, breast, and colon cancer. She agrees to continue to take  Vitamin D as directed and will follow-up for routine testing of Vitamin D, at least 2-3 times per year to avoid over-replacement.  Class 2 severe obesity with serious comorbidity and body mass index (BMI) of 37.0 to 37.9 in adult, unspecified obesity type ([place]).  [name] is currently in the action stage of change. As such, her goal is to continue with weight loss efforts. She has agreed to following a lower carbohydrate, vegetable and lean protein rich diet plan.   Exercise goals: For substantial health benefits, adults should do at least 150 minutes (2 hours and 30 minutes) a week of moderate-intensity, or 75 minutes (1 hour and 15 minutes) a week of vigorous-intensity aerobic physical activity, or an equivalent combination of moderate- and vigorous-intensity aerobic activity. Aerobic activity should be performed in episodes of at least 10 minutes, and preferably, it should be spread throughout the week.  Behavioral modification strategies: meal planning and cooking strategies and planning for success.  [name] has agreed to follow-up with our clinic in 3 weeks. She was informed of the importance of frequent follow-up visits to maximize her success with intensive lifestyle modifications for her multiple health conditions.   Objective:   Blood pressure 110/78, pulse 85, temperature 98.3 F (36.8 C), temperature source Oral, height 5\' 5"  (1.651 m), weight 228 lb (103.4 kg), SpO2 98 %, unknown if currently breastfeeding. Body mass index is 37.94 kg/m.  General: Cooperative, alert, well developed, in no acute distress. HEENT: Conjunctivae and lids unremarkable. Cardiovascular: Regular rhythm.  Lungs: Normal  work of breathing. Neurologic: No focal deficits.   Lab Results  Component Value Date   CREATININE 0.73 05/15/2019   BUN 13 05/15/2019   NA 138 05/15/2019   K 4.6 05/15/2019   CL 104 05/15/2019   CO2 23 05/15/2019   Lab Results  Component Value Date   ALT 18 05/15/2019   AST 16  05/15/2019   ALKPHOS 51 05/15/2019   BILITOT 0.3 05/15/2019   Lab Results  Component Value Date   HGBA1C 5.7 (H) 05/15/2019   HGBA1C 5.8 (H) 01/29/2019   HGBA1C 5.7 (H) 08/30/2018   HGBA1C 5.8 (H) 12/27/2017   HGBA1C 6.0 (H) 09/27/2017   Lab Results  Component Value Date   INSULIN 19.3 05/15/2019   INSULIN 20.0 01/29/2019   INSULIN 33.0 (H) 08/30/2018   INSULIN 26.2 (H) 02/06/2018   Lab Results  Component Value Date   TSH 1.870 09/13/2018   Lab Results  Component Value Date   CHOL 155 01/29/2019   HDL 49 01/29/2019   LDLCALC 94 01/29/2019   TRIG 58 01/29/2019   CHOLHDL 3.4 09/27/2017   Lab Results  Component Value Date   WBC 6.4 10/02/2018   HGB 11.8 10/02/2018   HCT 35.4 10/02/2018   MCV 86.1 10/02/2018   PLT 360 10/02/2018   Lab Results  Component Value Date   IRON 54 09/30/2017   TIBC 373 09/30/2017   FERRITIN 81 09/30/2017   Attestation Statements:   Reviewed by clinician on day of visit: allergies, medications, problem list, medical history, surgical history, family history, social history, and previous encounter notes.  Time spent on visit including pre-visit chart review and post-visit charting and care was 30 minutes.   IMichaelene Song, am acting as transcriptionist for Abby Potash, PA-C   I have reviewed the above documentation for accuracy and completeness, and I agree with the above. Abby Potash, PA-C

## 2019-10-01 ENCOUNTER — Ambulatory Visit (INDEPENDENT_AMBULATORY_CARE_PROVIDER_SITE_OTHER): Payer: BC Managed Care – PPO | Admitting: Professional

## 2019-10-01 DIAGNOSIS — F331 Major depressive disorder, recurrent, moderate: Secondary | ICD-10-CM | POA: Diagnosis not present

## 2019-10-01 DIAGNOSIS — F411 Generalized anxiety disorder: Secondary | ICD-10-CM | POA: Diagnosis not present

## 2019-10-03 ENCOUNTER — Encounter (INDEPENDENT_AMBULATORY_CARE_PROVIDER_SITE_OTHER): Payer: Self-pay | Admitting: Physician Assistant

## 2019-10-03 ENCOUNTER — Other Ambulatory Visit: Payer: Self-pay

## 2019-10-03 ENCOUNTER — Ambulatory Visit (INDEPENDENT_AMBULATORY_CARE_PROVIDER_SITE_OTHER): Payer: BC Managed Care – PPO | Admitting: Physician Assistant

## 2019-10-03 VITALS — BP 120/87 | HR 87 | Temp 98.0°F | Ht 65.0 in | Wt 225.0 lb

## 2019-10-03 DIAGNOSIS — E559 Vitamin D deficiency, unspecified: Secondary | ICD-10-CM

## 2019-10-03 DIAGNOSIS — E785 Hyperlipidemia, unspecified: Secondary | ICD-10-CM

## 2019-10-03 DIAGNOSIS — F3289 Other specified depressive episodes: Secondary | ICD-10-CM | POA: Diagnosis not present

## 2019-10-03 DIAGNOSIS — Z9189 Other specified personal risk factors, not elsewhere classified: Secondary | ICD-10-CM

## 2019-10-03 DIAGNOSIS — Z6837 Body mass index (BMI) 37.0-37.9, adult: Secondary | ICD-10-CM

## 2019-10-03 DIAGNOSIS — R7303 Prediabetes: Secondary | ICD-10-CM

## 2019-10-03 DIAGNOSIS — E66812 Obesity, class 2: Secondary | ICD-10-CM

## 2019-10-03 MED ORDER — CHOLECALCIFEROL 1.25 MG (50000 UT) PO TABS: 1.0000 | ORAL_TABLET | ORAL | 0 refills | Status: DC

## 2019-10-03 MED ORDER — BUPROPION HCL ER (SR) 150 MG PO TB12: 150.0000 mg | ORAL_TABLET | Freq: Two times a day (BID) | ORAL | 0 refills | Status: DC

## 2019-10-04 LAB — VITAMIN D 25 HYDROXY (VIT D DEFICIENCY, FRACTURES): Vit D, 25-Hydroxy: 56.1 ng/mL (ref 30.0–100.0)

## 2019-10-04 LAB — HEMOGLOBIN A1C
Est. average glucose Bld gHb Est-mCnc: 117 mg/dL
Hgb A1c MFr Bld: 5.7 % — ABNORMAL HIGH (ref 4.8–5.6)

## 2019-10-04 LAB — COMPREHENSIVE METABOLIC PANEL
ALT: 22 IU/L (ref 0–32)
AST: 13 IU/L (ref 0–40)
Albumin/Globulin Ratio: 1.6 (ref 1.2–2.2)
Albumin: 4.7 g/dL (ref 3.8–4.8)
Alkaline Phosphatase: 51 IU/L (ref 44–121)
BUN/Creatinine Ratio: 17 (ref 9–23)
BUN: 12 mg/dL (ref 6–20)
Bilirubin Total: 0.3 mg/dL (ref 0.0–1.2)
CO2: 23 mmol/L (ref 20–29)
Calcium: 9.8 mg/dL (ref 8.7–10.2)
Chloride: 104 mmol/L (ref 96–106)
Creatinine, Ser: 0.71 mg/dL (ref 0.57–1.00)
GFR calc Af Amer: 124 mL/min/{1.73_m2} (ref >59)
GFR calc non Af Amer: 108 mL/min/{1.73_m2} (ref >59)
Globulin, Total: 3 g/dL (ref 1.5–4.5)
Glucose: 106 mg/dL — ABNORMAL HIGH (ref 65–99)
Potassium: 4.7 mmol/L (ref 3.5–5.2)
Sodium: 140 mmol/L (ref 134–144)
Total Protein: 7.7 g/dL (ref 6.0–8.5)

## 2019-10-04 LAB — LIPID PANEL
Chol/HDL Ratio: 3.1 ratio (ref 0.0–4.4)
Cholesterol, Total: 164 mg/dL (ref 100–199)
HDL: 53 mg/dL (ref >39)
LDL Chol Calc (NIH): 98 mg/dL (ref 0–99)
Triglycerides: 69 mg/dL (ref 0–149)
VLDL Cholesterol Cal: 13 mg/dL (ref 5–40)

## 2019-10-04 LAB — INSULIN, RANDOM: INSULIN: 29.1 u[IU]/mL — ABNORMAL HIGH (ref 2.6–24.9)

## 2019-10-04 NOTE — Progress Notes (Signed)
Chief Complaint:   OBESITY [name] is here to discuss her progress with her obesity treatment plan along with follow-up of her obesity related diagnoses. [name] is on following a lower carbohydrate, vegetable and lean protein rich diet plan and states she is following her eating plan approximately 30% of the time. [name] states she is doing cardio for 60 minutes 3 times per week.  Today's visit was #: 82 Starting weight: 233 lbs Starting date: 02/06/2018 Today's weight: 225 lbs Today's date: 10/03/2019 Total lbs lost to date: 8 Total lbs lost since last in-office visit: 3  Interim History: Jontavia reports that she did not meal prep well and therefore struggled with the Low carbohydrate plan. She did not journal and adapted to following Category 2 when not doing the low carbohydrate plan. She continues to feel unmotivated.  Subjective:   1. Pre-diabetes [name] denies hunger taking metformin, as she did not tolerate. Last A1c was not at goal. She is due for labs.  2. Vitamin D deficiency [name] is on Vit D, and she denies nausea, vomiting, or muscle weakness.  3. Dyslipidemia [name] is not on medications and she is exercising regularly.   4. Other depression, with emotional eating [name] is on Wellbutrin, and she denies suicidal ideas or homicidal ideas.  5. At risk for diabetes mellitus [name] is at higher than average risk for developing diabetes due to her obesity.   Assessment/Plan:   1. Pre-diabetes [name] will continue to work on weight loss, exercise, and decreasing simple carbohydrates to help decrease the risk of diabetes. We will check labs today.  - Hemoglobin A1c - Insulin, random  2. Vitamin D deficiency Low Vitamin D level contributes to fatigue and are associated with obesity, breast, and colon cancer. We will check labs today, and we will refill prescription Vitamin D for 1 month. [name] will follow-up for routine testing of Vitamin D, at least 2-3 times per  year to avoid over-replacement.  - VITAMIN D 25 Hydroxy (Vit-D Deficiency, Fractures) - Cholecalciferol 1.25 MG (50000 UT) TABS; Take 1 capsule by mouth every 14 (fourteen) days. 1 tablet weekly  Dispense: 2 tablet; Refill: 0  3. Dyslipidemia Cardiovascular risk and specific lipid/LDL goals reviewed. We discussed several lifestyle modifications today. We will check labs today. [name] will continue to work on diet, exercise and weight loss efforts. Orders and follow up as documented in patient record.   Counseling Intensive lifestyle modifications are the first line treatment for this issue. . Dietary changes: Increase soluble fiber. Decrease simple carbohydrates. . Exercise changes: Moderate to vigorous-intensity aerobic activity 150 minutes per week if tolerated. . Lipid-lowering medications: see documented in medical record.  - Lipid panel - Comprehensive metabolic panel  4. Other depression, with emotional eating Behavior modification techniques were discussed today to help [name] deal with her emotional/non-hunger eating behaviors. We will refill Wellbutrin SR for 1 month. Orders and follow up as documented in patient record.   - buPROPion (WELLBUTRIN SR) 150 MG 12 hr tablet; Take 1 tablet (150 mg total) by mouth 2 (two) times daily.  Dispense: 60 tablet; Refill: 0  5. At risk for diabetes mellitus [name] was given approximately 15 minutes of diabetes education and counseling today. We discussed intensive lifestyle modifications today with an emphasis on weight loss as well as increasing exercise and decreasing simple carbohydrates in her diet. We also reviewed medication options with an emphasis on risk versus benefit of those discussed.   Repetitive spaced learning was employed today  to elicit superior memory formation and behavioral change.  6. Class 2 severe obesity with serious comorbidity and body mass index (BMI) of 37.0 to 37.9 in adult, unspecified obesity type ([facility]) [name]  is currently in the action stage of change. As such, her goal is to continue with weight loss efforts. She has agreed to the Category 2 Plan.   Exercise goals: As is.  Behavioral modification strategies: increasing lean protein intake and no skipping meals.  [name] has agreed to follow-up with our clinic in 2 weeks. She was informed of the importance of frequent follow-up visits to maximize her success with intensive lifestyle modifications for her multiple health conditions.   [name] was informed we would discuss her lab results at her next visit unless there is a critical issue that needs to be addressed sooner. [name] agreed to keep her next visit at the agreed upon time to discuss these results.  Objective:   Blood pressure 120/87, pulse 87, temperature 98 F (36.7 C), temperature source Oral, height 5\' 5"  (1.651 m), weight 225 lb (102.1 kg), SpO2 99 %, unknown if currently breastfeeding. Body mass index is 37.44 kg/m.  General: Cooperative, alert, well developed, in no acute distress. HEENT: Conjunctivae and lids unremarkable. Cardiovascular: Regular rhythm.  Lungs: Normal work of breathing. Neurologic: No focal deficits.   Lab Results  Component Value Date   CREATININE 0.71 10/03/2019   BUN 12 10/03/2019   NA 140 10/03/2019   K 4.7 10/03/2019   CL 104 10/03/2019   CO2 23 10/03/2019   Lab Results  Component Value Date   ALT 22 10/03/2019   AST 13 10/03/2019   ALKPHOS 51 10/03/2019   BILITOT 0.3 10/03/2019   Lab Results  Component Value Date   HGBA1C 5.7 (H) 10/03/2019   HGBA1C 5.7 (H) 05/15/2019   HGBA1C 5.8 (H) 01/29/2019   HGBA1C 5.7 (H) 08/30/2018   HGBA1C 5.8 (H) 12/27/2017   Lab Results  Component Value Date   INSULIN 29.1 (H) 10/03/2019   INSULIN 19.3 05/15/2019   INSULIN 20.0 01/29/2019   INSULIN 33.0 (H) 08/30/2018   INSULIN 26.2 (H) 02/06/2018   Lab Results  Component Value Date   TSH 1.870 09/13/2018   Lab Results  Component Value Date    CHOL 164 10/03/2019   HDL 53 10/03/2019   LDLCALC 98 10/03/2019   TRIG 69 10/03/2019   CHOLHDL 3.1 10/03/2019   Lab Results  Component Value Date   WBC 6.4 10/02/2018   HGB 11.8 10/02/2018   HCT 35.4 10/02/2018   MCV 86.1 10/02/2018   PLT 360 10/02/2018   Lab Results  Component Value Date   IRON 54 09/30/2017   TIBC 373 09/30/2017   FERRITIN 81 09/30/2017   Attestation Statements:   Reviewed by clinician on day of visit: allergies, medications, problem list, medical history, surgical history, family history, social history, and previous encounter notes.   Wilhemena Durie, am acting as transcriptionist for [employer], PA-C.  I have reviewed the above documentation for accuracy and completeness, and I agree with the above. Abby Potash, PA-C

## 2019-10-22 ENCOUNTER — Ambulatory Visit (INDEPENDENT_AMBULATORY_CARE_PROVIDER_SITE_OTHER): Payer: BC Managed Care – PPO | Admitting: Professional

## 2019-10-22 DIAGNOSIS — F411 Generalized anxiety disorder: Secondary | ICD-10-CM

## 2019-10-22 DIAGNOSIS — F331 Major depressive disorder, recurrent, moderate: Secondary | ICD-10-CM | POA: Diagnosis not present

## 2019-10-25 ENCOUNTER — Ambulatory Visit (INDEPENDENT_AMBULATORY_CARE_PROVIDER_SITE_OTHER): Payer: BC Managed Care – PPO | Admitting: Physician Assistant

## 2019-10-25 ENCOUNTER — Encounter (INDEPENDENT_AMBULATORY_CARE_PROVIDER_SITE_OTHER): Payer: Self-pay | Admitting: Physician Assistant

## 2019-10-25 ENCOUNTER — Other Ambulatory Visit: Payer: Self-pay

## 2019-10-25 VITALS — BP 96/61 | HR 84 | Temp 98.8°F | Ht 65.0 in | Wt 225.0 lb

## 2019-10-25 DIAGNOSIS — E559 Vitamin D deficiency, unspecified: Secondary | ICD-10-CM | POA: Diagnosis not present

## 2019-10-25 DIAGNOSIS — F3289 Other specified depressive episodes: Secondary | ICD-10-CM | POA: Diagnosis not present

## 2019-10-25 DIAGNOSIS — Z9189 Other specified personal risk factors, not elsewhere classified: Secondary | ICD-10-CM

## 2019-10-25 DIAGNOSIS — R7303 Prediabetes: Secondary | ICD-10-CM

## 2019-10-25 DIAGNOSIS — Z6837 Body mass index (BMI) 37.0-37.9, adult: Secondary | ICD-10-CM

## 2019-10-25 MED ORDER — METFORMIN HCL ER 500 MG PO TB24: 500.0000 mg | ORAL_TABLET | Freq: Every day | ORAL | 0 refills | Status: DC

## 2019-10-25 MED ORDER — BUPROPION HCL ER (SR) 150 MG PO TB12: 150.0000 mg | ORAL_TABLET | Freq: Two times a day (BID) | ORAL | 0 refills | Status: DC

## 2019-10-25 NOTE — Progress Notes (Signed)
Chief Complaint:   OBESITY [name] is here to discuss her progress with her obesity treatment plan along with follow-up of her obesity related diagnoses. [name] is on the Category 2 Plan and states she is following her eating plan approximately 70% of the time. [name] states she is walking 20-60 minutes 2-3 times per week.  Today's visit was #: 69 Starting weight: 233 lbs Starting date: 02/06/2018 Today's weight: 225 lbs Today's date: 10/25/2019 Total lbs lost to date: 8 Total lbs lost since last in-office visit: 0  Interim History: [name] states that she is surprised by the lack of weight loss. She notes excessive hunger at night. She has not been weighing her protein.  Subjective:   Vitamin D deficiency. Last Vitamin D level was at goal.   Ref. Range 10/03/2019 11:19  Vitamin D, 25-Hydroxy Latest Ref Range: 30.0 - 100.0 ng/mL 56.1   Other depression, with emotional eating. [name] is struggling with emotional eating and using food for comfort to the extent that it is negatively impacting her health. She has been working on behavior modification techniques to help reduce her emotional eating and has been somewhat successful. She shows no sign of suicidal or homicidal ideations.  Pre-diabetes. [name] has a diagnosis of prediabetes based on her elevated HgA1c and was informed this puts her at greater risk of developing diabetes. She continues to work on diet and exercise to decrease her risk of diabetes. She denies nausea or hypoglycemia. Metformin historically causes nausea and stomach upset.  Lab Results  Component Value Date   HGBA1C 5.7 (H) 10/03/2019   Lab Results  Component Value Date   INSULIN 29.1 (H) 10/03/2019   INSULIN 19.3 05/15/2019   INSULIN 20.0 01/29/2019   INSULIN 33.0 (H) 08/30/2018   INSULIN 26.2 (H) 02/06/2018   At risk for hypoglycemia. Shoshannah is at increased risk for hypoglycemia due to changes in diet, diagnosis of diabetes, and/or insulin  use.  Assessment/Plan:   Vitamin D deficiency. Low Vitamin D level contributes to fatigue and are associated with obesity, breast, and colon cancer. She agrees to change to OTC Vitamin D @ 5,000 units daily and will discontinue 50,000 units. She will follow-up for routine testing of Vitamin D, at least 2-3 times per year to avoid over-replacement.  Other depression, with emotional eating. Behavior modification techniques were discussed today to help [name] deal with her emotional/non-hunger eating behaviors.  Orders and follow up as documented in patient record. Refill was given for buPROPion (WELLBUTRIN SR) 150 MG 12 hr tablet #60 with 0 refills.  Pre-diabetes. [name] will continue to work on weight loss, exercise, and decreasing simple carbohydrates to help decrease the risk of diabetes. [name] will start metFORMIN (GLUCOPHAGE XR) 500 MG 24 hr tablet 1 PO QAM #30.  At risk for hypoglycemia. [name] was given approximately 15 minutes of counseling today regarding prevention of hypoglycemia. She was advised of symptoms of hypoglycemia. [name] was instructed to avoid skipping meals, eat regular protein rich meals and schedule low calorie snacks as needed.   Repetitive spaced learning was employed today to elicit superior memory formation and behavioral change.  Class 2 severe obesity with serious comorbidity and body mass index (BMI) of 37.0 to 37.9 in adult, unspecified obesity type ([place]).  [name] is currently in the action stage of change. As such, her goal is to continue with weight loss efforts. She has agreed to the Category 2 Plan.   IC will be checked at her next office  visit.  Exercise goals: For substantial health benefits, adults should do at least 150 minutes (2 hours and 30 minutes) a week of moderate-intensity, or 75 minutes (1 hour and 15 minutes) a week of vigorous-intensity aerobic physical activity, or an equivalent combination of moderate- and vigorous-intensity aerobic activity.  Aerobic activity should be performed in episodes of at least 10 minutes, and preferably, it should be spread throughout the week.  Behavioral modification strategies: increasing water intake, keeping healthy foods in the home and ways to avoid boredom eating.  [name] has agreed to follow-up with our clinic in 2 weeks. She was informed of the importance of frequent follow-up visits to maximize her success with intensive lifestyle modifications for her multiple health conditions.   Objective:   Blood pressure 96/61, pulse 84, temperature 98.8 F (37.1 C), height 5\' 5"  (1.651 m), weight 225 lb (102.1 kg), SpO2 100 %, unknown if currently breastfeeding. Body mass index is 37.44 kg/m.  General: Cooperative, alert, well developed, in no acute distress. HEENT: Conjunctivae and lids unremarkable. Cardiovascular: Regular rhythm.  Lungs: Normal work of breathing. Neurologic: No focal deficits.   Lab Results  Component Value Date   CREATININE 0.71 10/03/2019   BUN 12 10/03/2019   NA 140 10/03/2019   K 4.7 10/03/2019   CL 104 10/03/2019   CO2 23 10/03/2019   Lab Results  Component Value Date   ALT 22 10/03/2019   AST 13 10/03/2019   ALKPHOS 51 10/03/2019   BILITOT 0.3 10/03/2019   Lab Results  Component Value Date   HGBA1C 5.7 (H) 10/03/2019   HGBA1C 5.7 (H) 05/15/2019   HGBA1C 5.8 (H) 01/29/2019   HGBA1C 5.7 (H) 08/30/2018   HGBA1C 5.8 (H) 12/27/2017   Lab Results  Component Value Date   INSULIN 29.1 (H) 10/03/2019   INSULIN 19.3 05/15/2019   INSULIN 20.0 01/29/2019   INSULIN 33.0 (H) 08/30/2018   INSULIN 26.2 (H) 02/06/2018   Lab Results  Component Value Date   TSH 1.870 09/13/2018   Lab Results  Component Value Date   CHOL 164 10/03/2019   HDL 53 10/03/2019   LDLCALC 98 10/03/2019   TRIG 69 10/03/2019   CHOLHDL 3.1 10/03/2019   Lab Results  Component Value Date   WBC 6.4 10/02/2018   HGB 11.8 10/02/2018   HCT 35.4 10/02/2018   MCV 86.1 10/02/2018   PLT 360  10/02/2018   Lab Results  Component Value Date   IRON 54 09/30/2017   TIBC 373 09/30/2017   FERRITIN 81 09/30/2017   Attestation Statements:   Reviewed by clinician on day of visit: allergies, medications, problem list, medical history, surgical history, family history, social history, and previous encounter notes.  IMichaelene Song, am acting as transcriptionist for Abby Potash, PA-C   I have reviewed the above documentation for accuracy and completeness, and I agree with the above. Abby Potash, PA-C

## 2019-11-12 ENCOUNTER — Other Ambulatory Visit: Payer: Self-pay

## 2019-11-12 ENCOUNTER — Encounter (INDEPENDENT_AMBULATORY_CARE_PROVIDER_SITE_OTHER): Payer: Self-pay | Admitting: Physician Assistant

## 2019-11-12 ENCOUNTER — Telehealth (INDEPENDENT_AMBULATORY_CARE_PROVIDER_SITE_OTHER): Payer: BC Managed Care – PPO | Admitting: Physician Assistant

## 2019-11-12 ENCOUNTER — Ambulatory Visit (INDEPENDENT_AMBULATORY_CARE_PROVIDER_SITE_OTHER): Payer: BC Managed Care – PPO | Admitting: Physician Assistant

## 2019-11-12 VITALS — Ht 65.0 in | Wt 224.0 lb

## 2019-11-12 DIAGNOSIS — E559 Vitamin D deficiency, unspecified: Secondary | ICD-10-CM | POA: Diagnosis not present

## 2019-11-12 DIAGNOSIS — R7303 Prediabetes: Secondary | ICD-10-CM | POA: Diagnosis not present

## 2019-11-12 DIAGNOSIS — Z6837 Body mass index (BMI) 37.0-37.9, adult: Secondary | ICD-10-CM

## 2019-11-14 ENCOUNTER — Ambulatory Visit (INDEPENDENT_AMBULATORY_CARE_PROVIDER_SITE_OTHER): Payer: BC Managed Care – PPO | Admitting: Professional

## 2019-11-14 DIAGNOSIS — F411 Generalized anxiety disorder: Secondary | ICD-10-CM | POA: Diagnosis not present

## 2019-11-14 DIAGNOSIS — F331 Major depressive disorder, recurrent, moderate: Secondary | ICD-10-CM | POA: Diagnosis not present

## 2019-11-14 NOTE — Progress Notes (Signed)
TeleHealth Visit:  Due to the COVID-19 pandemic, this visit was completed with telemedicine (audio/video) technology to reduce patient and provider exposure as well as to preserve personal protective equipment.   [name] has verbally consented to this TeleHealth visit. The patient is located at home, the provider is located at the [facility] office. The participants in this visit include the listed provider and patient. The visit was conducted today via video.  Chief Complaint: OBESITY [name] is here to discuss her progress with her obesity treatment plan along with follow-up of her obesity related diagnoses. [name] is on the Category 2 Plan and states she is following her eating plan approximately 50% of the time. [name] states she is walking/aerobics 30-50 minutes 3-5 times per week.  Today's visit was #: 28 Starting weight: 233 lbs Starting date: 02/06/2018  Interim History: [name] reports her weight at home was 224 lbs. This is down 1 lb from her last visit. She had a girls' trip this past weekend and did not eat on plan. She finds herself hungry after dinner.  Subjective:   Vitamin D deficiency. Nori is on Vitamin D every other week, which she is tolerating well.   Ref. Range 10/03/2019 11:19  Vitamin D, 25-Hydroxy Latest Ref Range: 30.0 - 100.0 ng/mL 56.1   Pre-diabetes. [name] has a diagnosis of prediabetes based on her elevated HgA1c and was informed this puts her at greater risk of developing diabetes. She continues to work on diet and exercise to decrease her risk of diabetes. She denies nausea or hypoglycemia. [name] is on metformin once daily and reports some polyphagia.  Lab Results  Component Value Date   HGBA1C 5.7 (H) 10/03/2019   Lab Results  Component Value Date   INSULIN 29.1 (H) 10/03/2019   INSULIN 19.3 05/15/2019   INSULIN 20.0 01/29/2019   INSULIN 33.0 (H) 08/30/2018   INSULIN 26.2 (H) 02/06/2018   Assessment/Plan:   Vitamin  D deficiency. Low Vitamin D level contributes to fatigue and are associated with obesity, breast, and colon cancer. She will change from taking Vitamin D every other week to Vitamin D 5,000 units daily and will follow-up for routine testing of Vitamin D, at least 2-3 times per year to avoid over-replacement.  Pre-diabetes. [name] will continue to work on weight loss, exercise, and decreasing simple carbohydrates to help decrease the risk of diabetes. She will continue with metformin as directed.   Class 2 severe obesity with serious comorbidity and body mass index (BMI) of 37.0 to 37.9 in adult, unspecified obesity type ([place]).  [name] is currently in the action stage of change. As such, her goal is to continue with weight loss efforts. She has agreed to the Category 2 Plan.    IC will be performed at the time of her next office visit.  Exercise goals: For substantial health benefits, adults should do at least 150 minutes (2 hours and 30 minutes) a week of moderate-intensity, or 75 minutes (1 hour and 15 minutes) a week of vigorous-intensity aerobic physical activity, or an equivalent combination of moderate- and vigorous-intensity aerobic activity. Aerobic activity should be performed in episodes of at least 10 minutes, and preferably, it should be spread throughout the week.  Behavioral modification strategies: meal planning and cooking strategies, keeping healthy foods in the home and better snacking choices.  [name] has agreed to follow-up with our clinic in 2 weeks. She was informed of the importance of frequent follow-up visits to maximize her success  with intensive lifestyle modifications for her multiple health conditions.  Objective:   VITALS: Per patient if applicable, see vitals. GENERAL: Alert and in no acute distress. CARDIOPULMONARY: No increased WOB. Speaking in clear sentences.  PSYCH: Pleasant and cooperative. Speech normal rate and rhythm. Affect is appropriate. Insight and  judgement are appropriate. Attention is focused, linear, and appropriate.  NEURO: Oriented as arrived to appointment on time with no prompting.   Lab Results  Component Value Date   CREATININE 0.71 10/03/2019   BUN 12 10/03/2019   NA 140 10/03/2019   K 4.7 10/03/2019   CL 104 10/03/2019   CO2 23 10/03/2019   Lab Results  Component Value Date   ALT 22 10/03/2019   AST 13 10/03/2019   ALKPHOS 51 10/03/2019   BILITOT 0.3 10/03/2019   Lab Results  Component Value Date   HGBA1C 5.7 (H) 10/03/2019   HGBA1C 5.7 (H) 05/15/2019   HGBA1C 5.8 (H) 01/29/2019   HGBA1C 5.7 (H) 08/30/2018   HGBA1C 5.8 (H) 12/27/2017   Lab Results  Component Value Date   INSULIN 29.1 (H) 10/03/2019   INSULIN 19.3 05/15/2019   INSULIN 20.0 01/29/2019   INSULIN 33.0 (H) 08/30/2018   INSULIN 26.2 (H) 02/06/2018   Lab Results  Component Value Date   TSH 1.870 09/13/2018   Lab Results  Component Value Date   CHOL 164 10/03/2019   HDL 53 10/03/2019   LDLCALC 98 10/03/2019   TRIG 69 10/03/2019   CHOLHDL 3.1 10/03/2019   Lab Results  Component Value Date   WBC 6.4 10/02/2018   HGB 11.8 10/02/2018   HCT 35.4 10/02/2018   MCV 86.1 10/02/2018   PLT 360 10/02/2018   Lab Results  Component Value Date   IRON 54 09/30/2017   TIBC 373 09/30/2017   FERRITIN 81 09/30/2017   Attestation Statements:   Reviewed by clinician on day of visit: allergies, medications, problem list, medical history, surgical history, family history, social history, and previous encounter notes.  IMichaelene Song, am acting as transcriptionist for Abby Potash, PA-C   I have reviewed the above documentation for accuracy and completeness, and I agree with the above. Abby Potash, PA-C

## 2019-11-20 ENCOUNTER — Other Ambulatory Visit: Payer: Self-pay | Admitting: Obstetrics and Gynecology

## 2019-11-20 DIAGNOSIS — Z1231 Encounter for screening mammogram for malignant neoplasm of breast: Secondary | ICD-10-CM

## 2019-11-21 ENCOUNTER — Ambulatory Visit: Payer: Self-pay | Attending: Family

## 2019-11-21 DIAGNOSIS — Z23 Encounter for immunization: Secondary | ICD-10-CM

## 2019-12-03 ENCOUNTER — Encounter (INDEPENDENT_AMBULATORY_CARE_PROVIDER_SITE_OTHER): Payer: Self-pay | Admitting: Physician Assistant

## 2019-12-03 ENCOUNTER — Other Ambulatory Visit: Payer: Self-pay

## 2019-12-03 ENCOUNTER — Ambulatory Visit (INDEPENDENT_AMBULATORY_CARE_PROVIDER_SITE_OTHER): Payer: BC Managed Care – PPO | Admitting: Physician Assistant

## 2019-12-03 VITALS — BP 118/79 | HR 99 | Temp 98.3°F | Ht 65.0 in | Wt 225.0 lb

## 2019-12-03 DIAGNOSIS — R0602 Shortness of breath: Secondary | ICD-10-CM

## 2019-12-03 DIAGNOSIS — F3289 Other specified depressive episodes: Secondary | ICD-10-CM

## 2019-12-03 DIAGNOSIS — Z9189 Other specified personal risk factors, not elsewhere classified: Secondary | ICD-10-CM

## 2019-12-03 DIAGNOSIS — Z6837 Body mass index (BMI) 37.0-37.9, adult: Secondary | ICD-10-CM

## 2019-12-03 NOTE — Progress Notes (Signed)
Chief Complaint:   OBESITY [name] is here to discuss her progress with her obesity treatment plan along with follow-up of her obesity related diagnoses. [name] is on the Category 2 Plan and states she is following her eating plan approximately 75% of the time. [name] states she is walking 15-20 minutes 3 times per week.  Today's visit was #: 13 Starting weight: 233 lbs Starting date: 02/06/2018 Today's weight: 225 lbs Today's date: 12/03/2019 Total lbs lost to date: 8 Total lbs lost since last in-office visit: 0  Interim History: [name] states that she was on vacation with her husband and did not eat on plan. She has been excessively hungry and having cravings, especially during her menstrual cycle. She is not drinking enough water.  Subjective:   Other depression, with emotional eating. [name] is struggling with emotional eating and using food for comfort to the extent that it is negatively impacting her health. She has been working on behavior modification techniques to help reduce her emotional eating and has been somewhat successful. She shows no sign of suicidal or homicidal ideations. [name] is on bupropion, but continues to have cravings especially during her menstrual cycle.  SOB (shortness of breath) on exertion. [name] reports shortness of breath with exertion. No dizziness or lightheadedness.  At risk for dehydration. [name] is at increased risk for dehydration due to not drinking enough water.   Assessment/Plan:   Other depression, with emotional eating. Behavior modification techniques were discussed today to help Deya deal with her emotional/non-hunger eating behaviors.  Orders and follow up as documented in patient record. Refill was given for bupropion #60 with 0 refills.  SOB (shortness of breath) on exertion. [name]'s shortness of breath appears to be obesity related and exercise induced. She has agreed to work on weight loss and gradually increase  exercise to treat her exercise induced shortness of breath. Will continue to monitor closely. IC checked today.  At risk for dehydration. [name] was given approximately 15 minutes dehydration prevention counseling today. [name] is at risk for dehydration due to weight loss and current medication(s). She was encouraged to hydrate and monitor fluid status to avoid dehydration as well as weight loss plateaus.   Class 2 severe obesity with serious comorbidity and body mass index (BMI) of 37.0 to 37.9 in adult, unspecified obesity type ([place]).  [name] is currently in the action stage of change. As such, her goal is to continue with weight loss efforts. She has agreed to change to the Category 3 Plan.   Exercise goals: For substantial health benefits, adults should do at least 150 minutes (2 hours and 30 minutes) a week of moderate-intensity, or 75 minutes (1 hour and 15 minutes) a week of vigorous-intensity aerobic physical activity, or an equivalent combination of moderate- and vigorous-intensity aerobic activity. Aerobic activity should be performed in episodes of at least 10 minutes, and preferably, it should be spread throughout the week.  Behavioral modification strategies: increasing lean protein intake and meal planning and cooking strategies.  [name] has agreed to follow-up with our clinic in 2-3 weeks. She was informed of the importance of frequent follow-up visits to maximize her success with intensive lifestyle modifications for her multiple health conditions.   Objective:   Blood pressure 118/79, pulse 99, temperature 98.3 F (36.8 C), height 5\' 5"  (1.651 m), weight 225 lb (102.1 kg), SpO2 99 %, unknown if currently breastfeeding. Body mass index is 37.44 kg/m.  General: Cooperative, alert, well developed, in no acute  distress. HEENT: Conjunctivae and lids unremarkable. Cardiovascular: Regular rhythm.  Lungs: Normal work of breathing. Neurologic: No focal deficits.   Lab Results    Component Value Date   CREATININE 0.71 10/03/2019   BUN 12 10/03/2019   NA 140 10/03/2019   K 4.7 10/03/2019   CL 104 10/03/2019   CO2 23 10/03/2019   Lab Results  Component Value Date   ALT 22 10/03/2019   AST 13 10/03/2019   ALKPHOS 51 10/03/2019   BILITOT 0.3 10/03/2019   Lab Results  Component Value Date   HGBA1C 5.7 (H) 10/03/2019   HGBA1C 5.7 (H) 05/15/2019   HGBA1C 5.8 (H) 01/29/2019   HGBA1C 5.7 (H) 08/30/2018   HGBA1C 5.8 (H) 12/27/2017   Lab Results  Component Value Date   INSULIN 29.1 (H) 10/03/2019   INSULIN 19.3 05/15/2019   INSULIN 20.0 01/29/2019   INSULIN 33.0 (H) 08/30/2018   INSULIN 26.2 (H) 02/06/2018   Lab Results  Component Value Date   TSH 1.870 09/13/2018   Lab Results  Component Value Date   CHOL 164 10/03/2019   HDL 53 10/03/2019   LDLCALC 98 10/03/2019   TRIG 69 10/03/2019   CHOLHDL 3.1 10/03/2019   Lab Results  Component Value Date   WBC 6.4 10/02/2018   HGB 11.8 10/02/2018   HCT 35.4 10/02/2018   MCV 86.1 10/02/2018   PLT 360 10/02/2018   Lab Results  Component Value Date   IRON 54 09/30/2017   TIBC 373 09/30/2017   FERRITIN 81 09/30/2017   Attestation Statements:   Reviewed by clinician on day of visit: allergies, medications, problem list, medical history, surgical history, family history, social history, and previous encounter notes.  IMichaelene Song, am acting as transcriptionist for Abby Potash, PA-C   I have reviewed the above documentation for accuracy and completeness, and I agree with the above. Abby Potash, PA-C

## 2019-12-26 ENCOUNTER — Ambulatory Visit (INDEPENDENT_AMBULATORY_CARE_PROVIDER_SITE_OTHER): Payer: BC Managed Care – PPO | Admitting: Physician Assistant

## 2019-12-26 ENCOUNTER — Other Ambulatory Visit: Payer: Self-pay

## 2019-12-26 ENCOUNTER — Encounter (INDEPENDENT_AMBULATORY_CARE_PROVIDER_SITE_OTHER): Payer: Self-pay | Admitting: Physician Assistant

## 2019-12-26 VITALS — BP 123/79 | HR 82 | Temp 98.6°F | Ht 65.0 in | Wt 224.0 lb

## 2019-12-26 DIAGNOSIS — Z9189 Other specified personal risk factors, not elsewhere classified: Secondary | ICD-10-CM | POA: Diagnosis not present

## 2019-12-26 DIAGNOSIS — Z6837 Body mass index (BMI) 37.0-37.9, adult: Secondary | ICD-10-CM | POA: Diagnosis not present

## 2019-12-26 DIAGNOSIS — R7303 Prediabetes: Secondary | ICD-10-CM

## 2019-12-26 DIAGNOSIS — F3289 Other specified depressive episodes: Secondary | ICD-10-CM | POA: Diagnosis not present

## 2019-12-26 MED ORDER — BUPROPION HCL ER (SR) 150 MG PO TB12: 150.0000 mg | ORAL_TABLET | Freq: Two times a day (BID) | ORAL | 0 refills | Status: DC

## 2019-12-26 MED ORDER — METFORMIN HCL ER 500 MG PO TB24: 500.0000 mg | ORAL_TABLET | Freq: Every day | ORAL | 0 refills | Status: DC

## 2019-12-26 NOTE — Progress Notes (Signed)
Chief Complaint:   OBESITY [name] is here to discuss her progress with her obesity treatment plan along with follow-up of her obesity related diagnoses. [name] is on the Category 3 Plan and states she is following her eating plan approximately 90% of the time. [name] states she is exercising 0 minutes 0 times per week.  Today's visit was #: 30 Starting weight: 233 lbs Starting date: 02/06/2018 Today's weight: 224 lbs Today's date: 12/26/2019 Total lbs lost to date: 9 Total lbs lost since last in-office visit: 1  Interim History: [name] reports that the change to the Category 3 meal plan was challenging due to the amount of food. She states her hunger is more satisfied. She is breaking up her protein at dinner throughout the day.  Subjective:   Prediabetes. [name] has a diagnosis of prediabetes based on her elevated HgA1c and was informed this puts her at greater risk of developing diabetes. She continues to work on diet and exercise to decrease her risk of diabetes. She denies nausea or hypoglycemia. [name] has not been taking metformin as often due to indulging during celebrations.  Lab Results  Component Value Date   HGBA1C 5.7 (H) 10/03/2019   Lab Results  Component Value Date   INSULIN 29.1 (H) 10/03/2019   INSULIN 19.3 05/15/2019   INSULIN 20.0 01/29/2019   INSULIN 33.0 (H) 08/30/2018   INSULIN 26.2 (H) 02/06/2018   Other depression, with emotional eating. [name] is struggling with emotional eating and using food for comfort to the extent that it is negatively impacting her health. She has been working on behavior modification techniques to help reduce her emotional eating and has been somewhat successful. She shows no sign of suicidal or homicidal ideations. [name] reports intermittent cravings.  At risk for hypoglycemia. [name] is at increased risk for hypoglycemia due to changes in diet, diagnosis of diabetes, and/or insulin use.    Assessment/Plan:    Prediabetes. [name] will continue to work on weight loss, exercise, and decreasing simple carbohydrates to help decrease the risk of diabetes. Refill was given for metFORMIN (GLUCOPHAGE XR) 500 MG 24 hr tablet #60 with 0 refills.  Other depression, with emotional eating. Behavior modification techniques were discussed today to help [name] deal with her emotional/non-hunger eating behaviors.  Orders and follow up as documented in patient record. Refill was given for buPROPion (WELLBUTRIN SR) 150 MG 12 hr tablet #60 with 0 refills.  At risk for hypoglycemia. [name] was given approximately 15 minutes of counseling today regarding prevention of hypoglycemia. She was advised of symptoms of hypoglycemia. [name] was instructed to avoid skipping meals, eat regular protein rich meals and schedule low calorie snacks as needed.   Repetitive spaced learning was employed today to elicit superior memory formation and behavioral change.   Class 2 severe obesity with serious comorbidity and body mass index (BMI) of 37.0 to 37.9 in adult, unspecified obesity type ([place]).  [name] is currently in the action stage of change. As such, her goal is to continue with weight loss efforts. She has agreed to the Category 3 Plan.   Exercise goals: For substantial health benefits, adults should do at least 150 minutes (2 hours and 30 minutes) a week of moderate-intensity, or 75 minutes (1 hour and 15 minutes) a week of vigorous-intensity aerobic physical activity, or an equivalent combination of moderate- and vigorous-intensity aerobic activity. Aerobic activity should be performed in episodes of at least 10 minutes, and preferably, it should be spread throughout the  week.  Behavioral modification strategies: meal planning and cooking strategies and keeping healthy foods in the home.  [name] has agreed to follow-up with our clinic in 4 weeks. She was informed of the importance of frequent follow-up visits to maximize her  success with intensive lifestyle modifications for her multiple health conditions.   Objective:   Blood pressure 123/79, pulse 82, temperature 98.6 F (37 C), height 5\' 5"  (1.651 m), weight 224 lb (101.6 kg), last menstrual period 11/26/2019, SpO2 98 %, unknown if currently breastfeeding. Body mass index is 37.28 kg/m.  General: Cooperative, alert, well developed, in no acute distress. HEENT: Conjunctivae and lids unremarkable. Cardiovascular: Regular rhythm.  Lungs: Normal work of breathing. Neurologic: No focal deficits.   Lab Results  Component Value Date   CREATININE 0.71 10/03/2019   BUN 12 10/03/2019   NA 140 10/03/2019   K 4.7 10/03/2019   CL 104 10/03/2019   CO2 23 10/03/2019   Lab Results  Component Value Date   ALT 22 10/03/2019   AST 13 10/03/2019   ALKPHOS 51 10/03/2019   BILITOT 0.3 10/03/2019   Lab Results  Component Value Date   HGBA1C 5.7 (H) 10/03/2019   HGBA1C 5.7 (H) 05/15/2019   HGBA1C 5.8 (H) 01/29/2019   HGBA1C 5.7 (H) 08/30/2018   HGBA1C 5.8 (H) 12/27/2017   Lab Results  Component Value Date   INSULIN 29.1 (H) 10/03/2019   INSULIN 19.3 05/15/2019   INSULIN 20.0 01/29/2019   INSULIN 33.0 (H) 08/30/2018   INSULIN 26.2 (H) 02/06/2018   Lab Results  Component Value Date   TSH 1.870 09/13/2018   Lab Results  Component Value Date   CHOL 164 10/03/2019   HDL 53 10/03/2019   LDLCALC 98 10/03/2019   TRIG 69 10/03/2019   CHOLHDL 3.1 10/03/2019   Lab Results  Component Value Date   WBC 6.4 10/02/2018   HGB 11.8 10/02/2018   HCT 35.4 10/02/2018   MCV 86.1 10/02/2018   PLT 360 10/02/2018   Lab Results  Component Value Date   IRON 54 09/30/2017   TIBC 373 09/30/2017   FERRITIN 81 09/30/2017   Attestation Statements:   Reviewed by clinician on day of visit: allergies, medications, problem list, medical history, surgical history, family history, social history, and previous encounter notes.  IMichaelene Song, am acting as  transcriptionist for Abby Potash, PA-C   I have reviewed the above documentation for accuracy and completeness, and I agree with the above. Abby Potash, PA-C

## 2020-01-07 ENCOUNTER — Other Ambulatory Visit: Payer: Self-pay

## 2020-01-07 ENCOUNTER — Ambulatory Visit
Admission: RE | Admit: 2020-01-07 | Discharge: 2020-01-07 | Disposition: A | Discharge status: Home / Self Care | Payer: BC Managed Care – PPO | Source: Ambulatory Visit | Attending: Obstetrics and Gynecology | Admitting: Obstetrics and Gynecology

## 2020-01-07 DIAGNOSIS — Z1231 Encounter for screening mammogram for malignant neoplasm of breast: Secondary | ICD-10-CM

## 2020-01-23 ENCOUNTER — Telehealth (INDEPENDENT_AMBULATORY_CARE_PROVIDER_SITE_OTHER): Payer: BC Managed Care – PPO | Admitting: Physician Assistant

## 2020-01-23 ENCOUNTER — Encounter (INDEPENDENT_AMBULATORY_CARE_PROVIDER_SITE_OTHER): Payer: Self-pay | Admitting: Physician Assistant

## 2020-01-23 DIAGNOSIS — F3289 Other specified depressive episodes: Secondary | ICD-10-CM

## 2020-01-23 DIAGNOSIS — Z6837 Body mass index (BMI) 37.0-37.9, adult: Secondary | ICD-10-CM

## 2020-01-23 DIAGNOSIS — E559 Vitamin D deficiency, unspecified: Secondary | ICD-10-CM

## 2020-01-24 NOTE — Progress Notes (Signed)
TeleHealth Visit:  Due to the COVID-19 pandemic, this visit was completed with telemedicine (audio/video) technology to reduce patient and provider exposure as well as to preserve personal protective equipment.   [name] has verbally consented to this TeleHealth visit. The patient is located at home, the provider is located at the [employer] [facility] office. The participants in this visit include the listed provider and patient. The visit was conducted today via MyChart video.   Chief Complaint: OBESITY [name] is here to discuss her progress with her obesity treatment plan along with follow-up of her obesity related diagnoses. [name] is on the Category 3 Plan and states she is following her eating plan approximately 0% of the time. [name] states she is doing 0 minutes 0 times per week.  Today's visit was #: 31 Starting weight: 233 lbs Starting date: 02/06/2018  Interim History: [name] states that during the holidays she was not on the plan at all. She is feeling tired, stressed, achy, and moody. She is planning to go grocery shopping in the next day or 2 and she is ready to get back on track.  Subjective:   1. Vitamin D deficiency Christel is on Bit D 5,000 units daily. Last Vit D level was at goal. She will be due for labs in the next 1-2 months.  2. Other depression, with emotional eating [name] is on bupropion, and she denies suicidal or homicidal ideas. She notes bupropion is helping with cravings.  Assessment/Plan:   1. Vitamin D deficiency Low Vitamin D level contributes to fatigue and are associated with obesity, breast, and colon cancer. [name] agreed to continue taking OTC Vitamin D 5,000 IU daily and we will recheck labs in the next 1-2 months. She will follow-up for routine testing of Vitamin D, at least 2-3 times per year to avoid over-replacement.  2. Other depression, with emotional eating Behavior modification techniques were discussed today to help [name] deal  with her emotional/non-hunger eating behaviors. Miniya will continue her medications and meal plan. Orders and follow up as documented in patient record.   3. Class 2 severe obesity with serious comorbidity and body mass index (BMI) of 37.0 to 37.9 in adult, unspecified obesity type (HCC) [name] is currently in the action stage of change. As such, her goal is to continue with weight loss efforts. She has agreed to the Category 3 Plan.   Exercise goals: No exercise has been prescribed at this time.  Behavioral modification strategies: meal planning and cooking strategies and planning for success.  [name] has agreed to follow-up with our clinic in 2 to 3 weeks. She was informed of the importance of frequent follow-up visits to maximize her success with intensive lifestyle modifications for her multiple health conditions.  Objective:   VITALS: Per patient if applicable, see vitals. GENERAL: Alert and in no acute distress. CARDIOPULMONARY: No increased WOB. Speaking in clear sentences.  PSYCH: Pleasant and cooperative. Speech normal rate and rhythm. Affect is appropriate. Insight and judgement are appropriate. Attention is focused, linear, and appropriate.  NEURO: Oriented as arrived to appointment on time with no prompting.   Lab Results  Component Value Date   CREATININE 0.71 10/03/2019   BUN 12 10/03/2019   NA 140 10/03/2019   K 4.7 10/03/2019   CL 104 10/03/2019   CO2 23 10/03/2019   Lab Results  Component Value Date   ALT 22 10/03/2019   AST 13 10/03/2019   ALKPHOS 51 10/03/2019   BILITOT 0.3 10/03/2019  Lab Results  Component Value Date   HGBA1C 5.7 (H) 10/03/2019   HGBA1C 5.7 (H) 05/15/2019   HGBA1C 5.8 (H) 01/29/2019   HGBA1C 5.7 (H) 08/30/2018   HGBA1C 5.8 (H) 12/27/2017   Lab Results  Component Value Date   INSULIN 29.1 (H) 10/03/2019   INSULIN 19.3 05/15/2019   INSULIN 20.0 01/29/2019   INSULIN 33.0 (H) 08/30/2018   INSULIN 26.2 (H) 02/06/2018   Lab Results   Component Value Date   TSH 1.870 09/13/2018   Lab Results  Component Value Date   CHOL 164 10/03/2019   HDL 53 10/03/2019   LDLCALC 98 10/03/2019   TRIG 69 10/03/2019   CHOLHDL 3.1 10/03/2019   Lab Results  Component Value Date   WBC 6.4 10/02/2018   HGB 11.8 10/02/2018   HCT 35.4 10/02/2018   MCV 86.1 10/02/2018   PLT 360 10/02/2018   Lab Results  Component Value Date   IRON 54 09/30/2017   TIBC 373 09/30/2017   FERRITIN 81 09/30/2017    Attestation Statements:   Reviewed by clinician on day of visit: allergies, medications, problem list, medical history, surgical history, family history, social history, and previous encounter notes.   Wilhemena Durie, am acting as transcriptionist for [employer], PA-C.  I have reviewed the above documentation for accuracy and completeness, and I agree with the above. Abby Potash, PA-C

## 2020-02-02 NOTE — Progress Notes (Signed)
   Covid-19 Vaccination Clinic  Name:  [name]    MRN: [id number] DOB: [birth year]  02/02/2020  [name] was observed post Covid-19 immunization for 15 minutes without incident. She was provided with Vaccine Information Sheet and instruction to access the V-Safe system.   [name] was instructed to call 911 with any severe reactions post vaccine: Marland Kitchen Difficulty breathing  . Swelling of face and throat  . A fast heartbeat  . A bad rash all over body  . Dizziness and weakness   Immunizations Administered    Name Date Dose VIS Date Route   Moderna Covid-19 Booster Vaccine 11/21/2019  9:40 AM 0.25 mL 11/07/2019 Intramuscular   Manufacturer: Moderna   Lot: [id number]   [place]: [id number]

## 2020-02-05 ENCOUNTER — Encounter (INDEPENDENT_AMBULATORY_CARE_PROVIDER_SITE_OTHER): Payer: Self-pay

## 2020-02-06 ENCOUNTER — Other Ambulatory Visit: Payer: Self-pay

## 2020-02-06 ENCOUNTER — Telehealth (INDEPENDENT_AMBULATORY_CARE_PROVIDER_SITE_OTHER): Payer: BC Managed Care – PPO | Admitting: Physician Assistant

## 2020-02-06 DIAGNOSIS — R7303 Prediabetes: Secondary | ICD-10-CM | POA: Diagnosis not present

## 2020-02-06 DIAGNOSIS — Z6837 Body mass index (BMI) 37.0-37.9, adult: Secondary | ICD-10-CM | POA: Diagnosis not present

## 2020-02-06 DIAGNOSIS — F3289 Other specified depressive episodes: Secondary | ICD-10-CM | POA: Diagnosis not present

## 2020-02-06 MED ORDER — BUPROPION HCL ER (SR) 150 MG PO TB12: 150.0000 mg | ORAL_TABLET | Freq: Two times a day (BID) | ORAL | 0 refills | Status: DC

## 2020-02-07 NOTE — Progress Notes (Signed)
TeleHealth Visit:  Due to the COVID-19 pandemic, this visit was completed with telemedicine (audio/video) technology to reduce patient and provider exposure as well as to preserve personal protective equipment.   [name] has verbally consented to this TeleHealth visit. The patient is located at home, the provider is located at the [facility] office. The participants in this visit include the listed provider and patient. The visit was conducted today via MyChart video.   Chief Complaint: OBESITY [name] is here to discuss her progress with her obesity treatment plan along with follow-up of her obesity related diagnoses. [name] is on the Category 3 Plan and states she is following her eating plan approximately 0% of the time. [name] states she is doing  0 minutes 0 times per week.  Today's visit was #: 12 Starting weight: 233 lbs Starting date: 02/06/2018  Interim History: [name] reports a great deal of stress recently, and she has been doing a lot of stress eating. She is not prepping breakfast or lunch, but she is eating on the plan for dinner.  Subjective:   1. Pre-diabetes [name] is on metformin, and she denies nausea, vomiting, diarrhea, or polyphagia.  2. Other depression, with emotional eating [name] states that she is having cravings, but she is not eating on the plan. She is taking her bupropion as prescribed.  Assessment/Plan:   1. Pre-diabetes [name] will continue metformin, and her meal plan, and will continue to work on weight loss, exercise, and decreasing simple carbohydrates to help decrease the risk of diabetes.   2. Other depression, with emotional eating Behavior modification techniques were discussed today to help [name] deal with her emotional/non-hunger eating behaviors. We will refill Wellbutrin Sr for 1 month. Orders and follow up as documented in patient record.   - buPROPion (WELLBUTRIN SR) 150 MG 12 hr tablet; Take 1 tablet (150 mg total) by  mouth 2 (two) times daily.  Dispense: 60 tablet; Refill: 0  3. Class 2 severe obesity with serious comorbidity and body mass index (BMI) of 37.0 to 37.9 in adult, unspecified obesity type (HCC) [name] is currently in the action stage of change. As such, her goal is to continue with weight loss efforts. She has agreed to the Category 3 Plan.   Exercise goals: No exercise has been prescribed at this time.  Behavioral modification strategies: no skipping meals and meal planning and cooking strategies.  [name] has agreed to follow-up with our clinic in 2 weeks. She was informed of the importance of frequent follow-up visits to maximize her success with intensive lifestyle modifications for her multiple health conditions.  Objective:   VITALS: Per patient if applicable, see vitals. GENERAL: Alert and in no acute distress. CARDIOPULMONARY: No increased WOB. Speaking in clear sentences.  PSYCH: Pleasant and cooperative. Speech normal rate and rhythm. Affect is appropriate. Insight and judgement are appropriate. Attention is focused, linear, and appropriate.  NEURO: Oriented as arrived to appointment on time with no prompting.   Lab Results  Component Value Date   CREATININE 0.71 10/03/2019   BUN 12 10/03/2019   NA 140 10/03/2019   K 4.7 10/03/2019   CL 104 10/03/2019   CO2 23 10/03/2019   Lab Results  Component Value Date   ALT 22 10/03/2019   AST 13 10/03/2019   ALKPHOS 51 10/03/2019   BILITOT 0.3 10/03/2019   Lab Results  Component Value Date   HGBA1C 5.7 (H) 10/03/2019   HGBA1C 5.7 (H) 05/15/2019   HGBA1C 5.8 (  H) 01/29/2019   HGBA1C 5.7 (H) 08/30/2018   HGBA1C 5.8 (H) 12/27/2017   Lab Results  Component Value Date   INSULIN 29.1 (H) 10/03/2019   INSULIN 19.3 05/15/2019   INSULIN 20.0 01/29/2019   INSULIN 33.0 (H) 08/30/2018   INSULIN 26.2 (H) 02/06/2018   Lab Results  Component Value Date   TSH 1.870 09/13/2018   Lab Results  Component Value Date   CHOL 164  10/03/2019   HDL 53 10/03/2019   LDLCALC 98 10/03/2019   TRIG 69 10/03/2019   CHOLHDL 3.1 10/03/2019   Lab Results  Component Value Date   WBC 6.4 10/02/2018   HGB 11.8 10/02/2018   HCT 35.4 10/02/2018   MCV 86.1 10/02/2018   PLT 360 10/02/2018   Lab Results  Component Value Date   IRON 54 09/30/2017   TIBC 373 09/30/2017   FERRITIN 81 09/30/2017    Attestation Statements:   Reviewed by clinician on day of visit: allergies, medications, problem list, medical history, surgical history, family history, social history, and previous encounter notes.   Wilhemena Durie, am acting as transcriptionist for [employer], PA-C.  I have reviewed the above documentation for accuracy and completeness, and I agree with the above. Abby Potash, PA-C

## 2020-02-19 ENCOUNTER — Telehealth: Payer: Self-pay | Admitting: Internal Medicine

## 2020-02-19 NOTE — Telephone Encounter (Signed)
[name] [phone]  Khylie called to say for about 3 weeks her left arm has been hurting and is getting worse, constant ache, hurts to move it, weak, this morning it even hurt to drive.

## 2020-02-19 NOTE — Telephone Encounter (Signed)
Called talk to patient, she does have a ortho doctor at [facility] so she is going to call them or just go to their walk in clinic.

## 2020-02-19 NOTE — Telephone Encounter (Signed)
I would suggest she call orthopedist of choice. If has no one,  she may call Dr. Trevor Mace office or Raliegh Ip.

## 2020-02-27 ENCOUNTER — Encounter (INDEPENDENT_AMBULATORY_CARE_PROVIDER_SITE_OTHER): Payer: Self-pay | Admitting: Physician Assistant

## 2020-02-27 ENCOUNTER — Ambulatory Visit (INDEPENDENT_AMBULATORY_CARE_PROVIDER_SITE_OTHER): Payer: BC Managed Care – PPO | Admitting: Physician Assistant

## 2020-02-27 ENCOUNTER — Other Ambulatory Visit: Payer: Self-pay

## 2020-02-27 VITALS — BP 141/83 | HR 92 | Temp 98.6°F | Ht 65.0 in | Wt 230.0 lb

## 2020-02-27 DIAGNOSIS — R7303 Prediabetes: Secondary | ICD-10-CM | POA: Diagnosis not present

## 2020-02-27 DIAGNOSIS — R5383 Other fatigue: Secondary | ICD-10-CM | POA: Diagnosis not present

## 2020-02-27 DIAGNOSIS — Z6838 Body mass index (BMI) 38.0-38.9, adult: Secondary | ICD-10-CM

## 2020-02-27 NOTE — Progress Notes (Unsigned)
Chief Complaint:   OBESITY [name] is here to discuss her progress with her obesity treatment plan along with follow-up of her obesity related diagnoses. [name] is on the Category 3 Plan and states she is following her eating plan approximately 0% of the time. [name] states she is doing 0 minutes 0 times per week.  Today's visit was #: 65 Starting weight: 233 lbs Starting date: 02/06/2018 Today's weight: 230 lbs Today's date: 02/27/2020 Total lbs lost to date: 3 Total lbs lost since last in-office visit: 0  Interim History: [name] reports that she has not been following the plan at all due to stressors at home and lack of motivation. She is feeling tired all the time and is much more sedentary with her working from home.  Subjective:   1. Other fatigue [name] not taking Vit D daily. She is off her regimen with her medications.  2. Pre-diabetes [name]'s last A1c was 5.7. She has not been taking metformin for the last month because she feels "bad" when she doesn't eat on the plan while taking it.  Assessment/Plan:   1. Other fatigue [name] is to take Vit D daily as ordered, and she will continue to follow up.  2. Pre-diabetes [name] will restart metformin as prescribed, and will continue to work on weight loss, exercise, and decreasing simple carbohydrates to help decrease the risk of diabetes.   3. Class 2 severe obesity with serious comorbidity and body mass index (BMI) of 38.0 to 38.9 in adult, unspecified obesity type (HCC) [name] is currently in the action stage of change. As such, her goal is to continue with weight loss efforts. She has agreed to the Category 3 Plan. She is to start by getting breakfast on track.  Exercise goals: No exercise has been prescribed at this time.  Behavioral modification strategies: meal planning and cooking strategies and keeping healthy foods in the home.  [name] has agreed to follow-up with our clinic in 2 weeks. She was informed of the  importance of frequent follow-up visits to maximize her success with intensive lifestyle modifications for her multiple health conditions.   Objective:   Blood pressure (!) 141/83, pulse 92, temperature 98.6 F (37 C), height 5\' 5"  (1.651 m), weight 230 lb (104.3 kg), SpO2 99 %, unknown if currently breastfeeding. Body mass index is 38.27 kg/m.  General: Cooperative, alert, well developed, in no acute distress. HEENT: Conjunctivae and lids unremarkable. Cardiovascular: Regular rhythm.  Lungs: Normal work of breathing. Neurologic: No focal deficits.   Lab Results  Component Value Date   CREATININE 0.71 10/03/2019   BUN 12 10/03/2019   NA 140 10/03/2019   K 4.7 10/03/2019   CL 104 10/03/2019   CO2 23 10/03/2019   Lab Results  Component Value Date   ALT 22 10/03/2019   AST 13 10/03/2019   ALKPHOS 51 10/03/2019   BILITOT 0.3 10/03/2019   Lab Results  Component Value Date   HGBA1C 5.7 (H) 10/03/2019   HGBA1C 5.7 (H) 05/15/2019   HGBA1C 5.8 (H) 01/29/2019   HGBA1C 5.7 (H) 08/30/2018   HGBA1C 5.8 (H) 12/27/2017   Lab Results  Component Value Date   INSULIN 29.1 (H) 10/03/2019   INSULIN 19.3 05/15/2019   INSULIN 20.0 01/29/2019   INSULIN 33.0 (H) 08/30/2018   INSULIN 26.2 (H) 02/06/2018   Lab Results  Component Value Date   TSH 1.870 09/13/2018   Lab Results  Component Value Date   CHOL 164 10/03/2019   HDL  53 10/03/2019   LDLCALC 98 10/03/2019   TRIG 69 10/03/2019   CHOLHDL 3.1 10/03/2019   Lab Results  Component Value Date   WBC 6.4 10/02/2018   HGB 11.8 10/02/2018   HCT 35.4 10/02/2018   MCV 86.1 10/02/2018   PLT 360 10/02/2018   Lab Results  Component Value Date   IRON 54 09/30/2017   TIBC 373 09/30/2017   FERRITIN 81 09/30/2017   Attestation Statements:   Reviewed by clinician on day of visit: allergies, medications, problem list, medical history, surgical history, family history, social history, and previous encounter notes.  Time spent on  visit including pre-visit chart review and post-visit care and charting was 30 minutes.    Wilhemena Durie, am acting as transcriptionist for [employer], PA-C.  I have reviewed the above documentation for accuracy and completeness, and I agree with the above. Abby Potash, PA-C

## 2020-03-08 ENCOUNTER — Ambulatory Visit (INDEPENDENT_AMBULATORY_CARE_PROVIDER_SITE_OTHER): Payer: BC Managed Care – PPO | Admitting: Professional

## 2020-03-08 DIAGNOSIS — F331 Major depressive disorder, recurrent, moderate: Secondary | ICD-10-CM

## 2020-03-08 DIAGNOSIS — F411 Generalized anxiety disorder: Secondary | ICD-10-CM

## 2020-03-12 ENCOUNTER — Telehealth: Payer: Self-pay | Admitting: Internal Medicine

## 2020-03-12 NOTE — Telephone Encounter (Signed)
Best if we see her next week.

## 2020-03-12 NOTE — Telephone Encounter (Signed)
[name] [phone]  Minda called to see if you could give her something stronger than below medication for depression, she is taking the below medication for stress eating but does not feel like it is helping her with the depression she is going thru and they can not prescribe her anything else. I let her know if you could she would need to be seen and it would be next week probably Tuesday would be the earliest.  Last seen CPE 10/06/2018  buPROPion (WELLBUTRIN SR) 150 MG 12 hr tablet prescribed by Abby Potash, PA-C  [place]

## 2020-03-13 NOTE — Telephone Encounter (Signed)
scheduled

## 2020-03-15 ENCOUNTER — Ambulatory Visit (INDEPENDENT_AMBULATORY_CARE_PROVIDER_SITE_OTHER): Payer: BC Managed Care – PPO | Admitting: Professional

## 2020-03-15 DIAGNOSIS — F331 Major depressive disorder, recurrent, moderate: Secondary | ICD-10-CM | POA: Diagnosis not present

## 2020-03-15 DIAGNOSIS — F411 Generalized anxiety disorder: Secondary | ICD-10-CM | POA: Diagnosis not present

## 2020-03-17 ENCOUNTER — Ambulatory Visit (INDEPENDENT_AMBULATORY_CARE_PROVIDER_SITE_OTHER): Payer: BC Managed Care – PPO | Admitting: Physician Assistant

## 2020-03-18 ENCOUNTER — Encounter: Payer: Self-pay | Admitting: Internal Medicine

## 2020-03-18 ENCOUNTER — Telehealth: Payer: Self-pay | Admitting: Internal Medicine

## 2020-03-18 ENCOUNTER — Ambulatory Visit (INDEPENDENT_AMBULATORY_CARE_PROVIDER_SITE_OTHER): Payer: BC Managed Care – PPO | Admitting: Internal Medicine

## 2020-03-18 ENCOUNTER — Other Ambulatory Visit: Payer: Self-pay

## 2020-03-18 ENCOUNTER — Encounter (INDEPENDENT_AMBULATORY_CARE_PROVIDER_SITE_OTHER): Payer: Self-pay | Admitting: Physician Assistant

## 2020-03-18 VITALS — BP 120/80 | HR 96 | Ht 65.0 in | Wt 230.0 lb

## 2020-03-18 DIAGNOSIS — F320 Major depressive disorder, single episode, mild: Secondary | ICD-10-CM | POA: Diagnosis not present

## 2020-03-18 DIAGNOSIS — R5383 Other fatigue: Secondary | ICD-10-CM | POA: Diagnosis not present

## 2020-03-18 DIAGNOSIS — Z6838 Body mass index (BMI) 38.0-38.9, adult: Secondary | ICD-10-CM | POA: Diagnosis not present

## 2020-03-18 MED ORDER — FLUOXETINE HCL 10 MG PO CAPS: 10.0000 mg | ORAL_CAPSULE | Freq: Every day | ORAL | 1 refills | Status: DC

## 2020-03-18 MED ORDER — FLUOXETINE HCL 20 MG PO TABS: 20.0000 mg | ORAL_TABLET | Freq: Every day | ORAL | 0 refills | Status: DC

## 2020-03-18 NOTE — Telephone Encounter (Signed)
Faxed Medical Records request for last office visit and any imaging. Fax number [phone], or [phone]

## 2020-03-18 NOTE — Telephone Encounter (Signed)
Medical records [facility]

## 2020-03-18 NOTE — ED Provider Notes (Incomplete)
I provided a substantive portion of the care of this patient.  I personally performed the entirety of the history, exam and medical decision making for this encounter.

## 2020-03-18 NOTE — Progress Notes (Signed)
   Subjective:    Patient ID: [name], female    DOB: August 26, 1980, 40 y.o.   MRN: [id number]  HPI 40 year old Female seen regarding depression.  She currently is being seen at [facility] healthy weight [facility].  Was placed by this clinic on Wellbutrin 150 mg SR every 12 hours.  Continues to feel depressed.  Not really motivated to diet exercise and lose weight.  Works a full-time job and has a family.  History of impaired glucose tolerance and was on Metformin but stopped that before Christmas.  Obesity and glucose intolerance runs in her family.  CBC on March 1 was within normal limits as well as TSH.  Hemoglobin A1c slightly elevated at 5.8%.  Vitamin D level normal at 34 but I believe the clinic would like for her to be a bit higher.  C-Met was normal.  Discussion regarding reasons for not losing weight.  Healthy food choices discussed.  Possibility of counseling discussed.    Review of Systems see above     Objective:   Physical Exam  Vital signs reviewed.  Blood pressure excellent 120/80, pulse 96 regular pulse oximetry 99% weight 230 pounds BMI 38.27  In September 2021 she weighed 225 pounds and in July 2021 she weighed 223 pounds.  Neck is supple without JVD thyromegaly or carotid bruits.  Chest is clear to auscultation.  Cardiac exam: Regular rate and rhythm.      Assessment & Plan:  Situational depression regarding obesity and lack of progress with weight loss.  She is already on Wellbutrin.  We can add low-dose Prozac and follow-up with her in early April.  Have recommended counseling.  Labs are within normal limits.

## 2020-03-19 ENCOUNTER — Other Ambulatory Visit (INDEPENDENT_AMBULATORY_CARE_PROVIDER_SITE_OTHER): Payer: Self-pay | Admitting: Physician Assistant

## 2020-03-19 DIAGNOSIS — F3289 Other specified depressive episodes: Secondary | ICD-10-CM

## 2020-03-19 LAB — CBC WITH DIFFERENTIAL/PLATELET
Absolute Monocytes: 721 cells/uL (ref 200–950)
Basophils Absolute: 48 cells/uL (ref 0–200)
Basophils Relative: 0.7 %
Eosinophils Absolute: 150 cells/uL (ref 15–500)
Eosinophils Relative: 2.2 %
HCT: 35.6 % (ref 35.0–45.0)
Hemoglobin: 12.2 g/dL (ref 11.7–15.5)
Lymphs Abs: 2414 cells/uL (ref 850–3900)
MCH: 29.1 pg (ref 27.0–33.0)
MCHC: 34.3 g/dL (ref 32.0–36.0)
MCV: 85 fL (ref 80.0–100.0)
MPV: 10.8 fL (ref 7.5–12.5)
Monocytes Relative: 10.6 %
Neutro Abs: 3468 cells/uL (ref 1500–7800)
Neutrophils Relative %: 51 %
Platelets: 406 10*3/uL — ABNORMAL HIGH (ref 140–400)
RBC: 4.19 10*6/uL (ref 3.80–5.10)
RDW: 12.5 % (ref 11.0–15.0)
Total Lymphocyte: 35.5 %
WBC: 6.8 10*3/uL (ref 3.8–10.8)

## 2020-03-19 LAB — COMPLETE METABOLIC PANEL WITH GFR
AG Ratio: 1.3 (calc) (ref 1.0–2.5)
ALT: 14 U/L (ref 6–29)
AST: 12 U/L (ref 10–30)
Albumin: 4.1 g/dL (ref 3.6–5.1)
Alkaline phosphatase (APISO): 44 U/L (ref 31–125)
BUN: 12 mg/dL (ref 7–25)
CO2: 26 mmol/L (ref 20–32)
Calcium: 9.6 mg/dL (ref 8.6–10.2)
Chloride: 105 mmol/L (ref 98–110)
Creat: 0.73 mg/dL (ref 0.50–1.10)
GFR, Est African American: 119 mL/min/{1.73_m2} (ref >=60)
GFR, Est Non African American: 103 mL/min/{1.73_m2} (ref >=60)
Globulin: 3.1 g/dL (calc) (ref 1.9–3.7)
Glucose, Bld: 88 mg/dL (ref 65–99)
Potassium: 4.6 mmol/L (ref 3.5–5.3)
Sodium: 139 mmol/L (ref 135–146)
Total Bilirubin: 0.2 mg/dL (ref 0.2–1.2)
Total Protein: 7.2 g/dL (ref 6.1–8.1)

## 2020-03-19 LAB — HEMOGLOBIN A1C
Hgb A1c MFr Bld: 5.8 % of total Hgb — ABNORMAL HIGH (ref <5.7)
Mean Plasma Glucose: 120 mg/dL
eAG (mmol/L): 6.6 mmol/L

## 2020-03-19 LAB — TSH: TSH: 1.97 mIU/L

## 2020-03-19 LAB — VITAMIN D 25 HYDROXY (VIT D DEFICIENCY, FRACTURES): Vit D, 25-Hydroxy: 34 ng/mL (ref 30–100)

## 2020-03-19 MED ORDER — BUPROPION HCL ER (SR) 150 MG PO TB12: 150.0000 mg | ORAL_TABLET | Freq: Two times a day (BID) | ORAL | 0 refills | Status: DC

## 2020-03-19 NOTE — Telephone Encounter (Signed)
I sent her in another. Thank you!

## 2020-03-19 NOTE — Telephone Encounter (Signed)
Refill after she schedules appt or wait and refill at her next appt?  Tyah Acord, [place]

## 2020-03-20 ENCOUNTER — Other Ambulatory Visit: Payer: Self-pay

## 2020-03-20 MED ORDER — ERGOCALCIFEROL 1.25 MG (50000 UT) PO CAPS: 50000.0000 [IU] | ORAL_CAPSULE | ORAL | 1 refills | Status: DC

## 2020-03-27 ENCOUNTER — Other Ambulatory Visit (INDEPENDENT_AMBULATORY_CARE_PROVIDER_SITE_OTHER): Payer: Self-pay | Admitting: Physician Assistant

## 2020-03-27 DIAGNOSIS — F3289 Other specified depressive episodes: Secondary | ICD-10-CM

## 2020-03-27 NOTE — Telephone Encounter (Signed)
Last seen by Tracey Aguilar, PA-C. 

## 2020-03-27 NOTE — Telephone Encounter (Signed)
I just sent a refill on 3/2 so she does not need another. thanks

## 2020-03-27 NOTE — Telephone Encounter (Signed)
Received records from [facility]

## 2020-03-29 ENCOUNTER — Ambulatory Visit (INDEPENDENT_AMBULATORY_CARE_PROVIDER_SITE_OTHER): Payer: BC Managed Care – PPO | Admitting: Professional

## 2020-03-29 DIAGNOSIS — F331 Major depressive disorder, recurrent, moderate: Secondary | ICD-10-CM | POA: Diagnosis not present

## 2020-03-29 DIAGNOSIS — F411 Generalized anxiety disorder: Secondary | ICD-10-CM | POA: Diagnosis not present

## 2020-03-31 ENCOUNTER — Other Ambulatory Visit (INDEPENDENT_AMBULATORY_CARE_PROVIDER_SITE_OTHER): Payer: Self-pay | Admitting: Physician Assistant

## 2020-03-31 DIAGNOSIS — F3289 Other specified depressive episodes: Secondary | ICD-10-CM

## 2020-04-01 NOTE — Telephone Encounter (Signed)
Last seen by Tracey Aguilar, PA-C. 

## 2020-04-02 ENCOUNTER — Encounter (INDEPENDENT_AMBULATORY_CARE_PROVIDER_SITE_OTHER): Payer: Self-pay | Admitting: Physician Assistant

## 2020-04-02 ENCOUNTER — Other Ambulatory Visit: Payer: Self-pay

## 2020-04-02 ENCOUNTER — Ambulatory Visit (INDEPENDENT_AMBULATORY_CARE_PROVIDER_SITE_OTHER): Payer: BC Managed Care – PPO | Admitting: Physician Assistant

## 2020-04-02 VITALS — BP 116/75 | HR 87 | Temp 98.6°F | Ht 65.0 in | Wt 225.0 lb

## 2020-04-02 DIAGNOSIS — F3289 Other specified depressive episodes: Secondary | ICD-10-CM | POA: Diagnosis not present

## 2020-04-02 DIAGNOSIS — Z6838 Body mass index (BMI) 38.0-38.9, adult: Secondary | ICD-10-CM

## 2020-04-02 DIAGNOSIS — E559 Vitamin D deficiency, unspecified: Secondary | ICD-10-CM | POA: Diagnosis not present

## 2020-04-02 DIAGNOSIS — Z9189 Other specified personal risk factors, not elsewhere classified: Secondary | ICD-10-CM

## 2020-04-02 MED ORDER — BUPROPION HCL ER (SR) 150 MG PO TB12: 150.0000 mg | ORAL_TABLET | Freq: Two times a day (BID) | ORAL | 0 refills | Status: DC

## 2020-04-04 LAB — HM PAP SMEAR

## 2020-04-05 ENCOUNTER — Ambulatory Visit (INDEPENDENT_AMBULATORY_CARE_PROVIDER_SITE_OTHER): Payer: BC Managed Care – PPO | Admitting: Professional

## 2020-04-05 DIAGNOSIS — F331 Major depressive disorder, recurrent, moderate: Secondary | ICD-10-CM

## 2020-04-05 DIAGNOSIS — F411 Generalized anxiety disorder: Secondary | ICD-10-CM

## 2020-04-08 NOTE — Progress Notes (Signed)
Chief Complaint:   OBESITY [name] is here to discuss her progress with her obesity treatment plan along with follow-up of her obesity related diagnoses. [name] is on the Category 3 Plan and states she is following her eating plan approximately 10% of the time. [name] states she is walking for 20-30 minutes 7 times this week only.  Today's visit was #: 82 Starting weight: 233 lbs Starting date: 02/06/2018 Today's weight: 225 lbs Today's date: 04/02/2020 Total lbs lost to date: 8 Total lbs lost since last in-office visit: 5  Interim History: [name] did well with weight loss. She got back on the plan last week after her primary care physician put her on Prozac. She is eating more protein. She has added yogurt and eating more berries.  Subjective:   1. Vitamin D deficiency [name] is on Vit D, and she is tolerating it well.  2. Other depression, with emotional eating [name] is on bupropion twice daily. She notes she ran out 1 week ago.  3. At risk for osteoporosis [name] is at higher risk of osteopenia and osteoporosis due to Vitamin D deficiency.   Assessment/Plan:   1. Vitamin D deficiency Low Vitamin D level contributes to fatigue and are associated with obesity, breast, and colon cancer. Langston agreed to continue taking prescription Vitamin D 50,000 IU every week and will follow-up for routine testing of Vitamin D, at least 2-3 times per year to avoid over-replacement.  2. Other depression, with emotional eating Behavior modification techniques were discussed today to help [name] deal with her emotional/non-hunger eating behaviors. We will refill Wellbutrin SR for 1 month. Orders and follow up as documented in patient record.   - buPROPion (WELLBUTRIN SR) 150 MG 12 hr tablet; Take 1 tablet (150 mg total) by mouth 2 (two) times daily.  Dispense: 60 tablet; Refill: 0  3. At risk for osteoporosis [name] was given approximately 15 minutes of osteoporosis prevention counseling  today. [name] is at risk for osteopenia and osteoporosis due to her Vitamin D deficiency. She was encouraged to take her Vitamin D and follow her higher calcium diet and increase strengthening exercise to help strengthen her bones and decrease her risk of osteopenia and osteoporosis.  Repetitive spaced learning was employed today to elicit superior memory formation and behavioral change.  4. Class 2 severe obesity with serious comorbidity and body mass index (BMI) of 38.0 to 38.9 in adult, unspecified obesity type ([place]); today BMI was 37.52 [name] is currently in the action stage of change. As such, her goal is to continue with weight loss efforts. She has agreed to the Category 3 Plan.   Exercise goals: As is.  Behavioral modification strategies: meal planning and cooking strategies and keeping healthy foods in the home.  [name] has agreed to follow-up with our clinic in 2 weeks. She was informed of the importance of frequent follow-up visits to maximize her success with intensive lifestyle modifications for her multiple health conditions.   Objective:   Blood pressure 116/75, pulse 87, temperature 98.6 F (37 C), height 5\' 5"  (1.651 m), weight 225 lb (102.1 kg), last menstrual period 03/11/2020, SpO2 98 %, unknown if currently breastfeeding. Body mass index is 37.44 kg/m.  General: Cooperative, alert, well developed, in no acute distress. HEENT: Conjunctivae and lids unremarkable. Cardiovascular: Regular rhythm.  Lungs: Normal work of breathing. Neurologic: No focal deficits.   Lab Results  Component Value Date   CREATININE 0.73 03/18/2020   BUN 12 03/18/2020   NA 139  03/18/2020   K 4.6 03/18/2020   CL 105 03/18/2020   CO2 26 03/18/2020   Lab Results  Component Value Date   ALT 14 03/18/2020   AST 12 03/18/2020   ALKPHOS 51 10/03/2019   BILITOT 0.2 03/18/2020   Lab Results  Component Value Date   HGBA1C 5.8 (H) 03/18/2020   HGBA1C 5.7 (H) 10/03/2019   HGBA1C 5.7 (H)  05/15/2019   HGBA1C 5.8 (H) 01/29/2019   HGBA1C 5.7 (H) 08/30/2018   Lab Results  Component Value Date   INSULIN 29.1 (H) 10/03/2019   INSULIN 19.3 05/15/2019   INSULIN 20.0 01/29/2019   INSULIN 33.0 (H) 08/30/2018   INSULIN 26.2 (H) 02/06/2018   Lab Results  Component Value Date   TSH 1.97 03/18/2020   Lab Results  Component Value Date   CHOL 164 10/03/2019   HDL 53 10/03/2019   LDLCALC 98 10/03/2019   TRIG 69 10/03/2019   CHOLHDL 3.1 10/03/2019   Lab Results  Component Value Date   WBC 6.8 03/18/2020   HGB 12.2 03/18/2020   HCT 35.6 03/18/2020   MCV 85.0 03/18/2020   PLT 406 (H) 03/18/2020   Lab Results  Component Value Date   IRON 54 09/30/2017   TIBC 373 09/30/2017   FERRITIN 81 09/30/2017   Attestation Statements:   Reviewed by clinician on day of visit: allergies, medications, problem list, medical history, surgical history, family history, social history, and previous encounter notes.   [name], am acting as transcriptionist for [employer], PA-C.  I have reviewed the above documentation for accuracy and completeness, and I agree with the above. Courtney Potash, PA-C

## 2020-04-09 ENCOUNTER — Ambulatory Visit: Payer: 59 | Admitting: Professional

## 2020-04-14 ENCOUNTER — Ambulatory Visit (INDEPENDENT_AMBULATORY_CARE_PROVIDER_SITE_OTHER): Payer: BC Managed Care – PPO | Admitting: Physician Assistant

## 2020-04-14 ENCOUNTER — Other Ambulatory Visit: Payer: Self-pay

## 2020-04-14 VITALS — BP 131/78 | HR 97 | Temp 98.6°F | Ht 65.0 in | Wt 229.0 lb

## 2020-04-14 DIAGNOSIS — F3289 Other specified depressive episodes: Secondary | ICD-10-CM

## 2020-04-14 DIAGNOSIS — R7303 Prediabetes: Secondary | ICD-10-CM

## 2020-04-14 DIAGNOSIS — Z9189 Other specified personal risk factors, not elsewhere classified: Secondary | ICD-10-CM

## 2020-04-14 DIAGNOSIS — Z6838 Body mass index (BMI) 38.0-38.9, adult: Secondary | ICD-10-CM

## 2020-04-14 MED ORDER — BUPROPION HCL ER (SR) 150 MG PO TB12: 150.0000 mg | ORAL_TABLET | Freq: Two times a day (BID) | ORAL | 0 refills | Status: DC

## 2020-04-16 ENCOUNTER — Ambulatory Visit (INDEPENDENT_AMBULATORY_CARE_PROVIDER_SITE_OTHER): Payer: BC Managed Care – PPO | Admitting: Professional

## 2020-04-16 DIAGNOSIS — F411 Generalized anxiety disorder: Secondary | ICD-10-CM | POA: Diagnosis not present

## 2020-04-16 DIAGNOSIS — F331 Major depressive disorder, recurrent, moderate: Secondary | ICD-10-CM | POA: Diagnosis not present

## 2020-04-16 NOTE — Patient Instructions (Signed)
It was a pleasure to see you today.  Start Prozac 10 mg daily.  Follow-up in early April.

## 2020-04-16 NOTE — Progress Notes (Signed)
Chief Complaint:   OBESITY [name] is here to discuss her progress with her obesity treatment plan along with follow-up of her obesity related diagnoses. [name] is on the Category 3 Plan and states she is following her eating plan approximately 50% of the time. [name] states she is walking for 15-20 minutes 2 times per week.  Today's visit was #: 88 Starting weight: 233 lbs Starting date: 02/06/2018 Today's weight: 229 lbs Today's date: 04/14/2020 Total lbs lost to date: 4 Total lbs lost since last in-office visit: 0  Interim History: [name] reports that the last few weeks she ate out more often due to family stressors. She has not been walking as much.  Subjective:   1. Pre-diabetes [name] is not on medications, and she denies polyphagia.  2. Other depression, with emotional eating [name] is on Wellbutrin 150 mg BID, and she notes reduced cravings.  3. At risk for diabetes mellitus [name] is at higher than average risk for developing diabetes due to obesity.   Assessment/Plan:   1. Pre-diabetes Tekela will continue her meal plan, increase exercise, and decreasing simple carbohydrates to help decrease the risk of diabetes.   2. Other depression, with emotional eating Behavior modification techniques were discussed today to help Mack deal with her emotional/non-hunger eating behaviors. We will refill Wellbutrin SR for 1 month. Orders and follow up as documented in patient record.   - buPROPion (WELLBUTRIN SR) 150 MG 12 hr tablet; Take 1 tablet (150 mg total) by mouth 2 (two) times daily.  Dispense: 60 tablet; Refill: 0  3. At risk for diabetes mellitus [name] was given approximately 15 minutes of diabetes education and counseling today. We discussed intensive lifestyle modifications today with an emphasis on weight loss as well as increasing exercise and decreasing simple carbohydrates in her diet. We also reviewed medication options with an emphasis on risk versus benefit  of those discussed.   Repetitive spaced learning was employed today to elicit superior memory formation and behavioral change.  4. Class 2 severe obesity with serious comorbidity and body mass index (BMI) of 38.0 to 38.9 in adult, unspecified obesity type ([place]); today BMI was 37.52 [name] is currently in the action stage of change. As such, her goal is to continue with weight loss efforts. She has agreed to the Category 3 Plan.   Exercise goals: As is.  Behavioral modification strategies: increasing lean protein intake and meal planning and cooking strategies.  [name] has agreed to follow-up with our clinic in 3 weeks. She was informed of the importance of frequent follow-up visits to maximize her success with intensive lifestyle modifications for her multiple health conditions.   Objective:   Blood pressure 131/78, pulse 97, temperature 98.6 F (37 C), height 5\' 5"  (1.651 m), weight 229 lb (103.9 kg), SpO2 97 %, unknown if currently breastfeeding. Body mass index is 38.11 kg/m.  General: Cooperative, alert, well developed, in no acute distress. HEENT: Conjunctivae and lids unremarkable. Cardiovascular: Regular rhythm.  Lungs: Normal work of breathing. Neurologic: No focal deficits.   Lab Results  Component Value Date   CREATININE 0.73 03/18/2020   BUN 12 03/18/2020   NA 139 03/18/2020   K 4.6 03/18/2020   CL 105 03/18/2020   CO2 26 03/18/2020   Lab Results  Component Value Date   ALT 14 03/18/2020   AST 12 03/18/2020   ALKPHOS 51 10/03/2019   BILITOT 0.2 03/18/2020   Lab Results  Component Value Date   HGBA1C 5.8 (H)  03/18/2020   HGBA1C 5.7 (H) 10/03/2019   HGBA1C 5.7 (H) 05/15/2019   HGBA1C 5.8 (H) 01/29/2019   HGBA1C 5.7 (H) 08/30/2018   Lab Results  Component Value Date   INSULIN 29.1 (H) 10/03/2019   INSULIN 19.3 05/15/2019   INSULIN 20.0 01/29/2019   INSULIN 33.0 (H) 08/30/2018   INSULIN 26.2 (H) 02/06/2018   Lab Results  Component Value Date   TSH  1.97 03/18/2020   Lab Results  Component Value Date   CHOL 164 10/03/2019   HDL 53 10/03/2019   LDLCALC 98 10/03/2019   TRIG 69 10/03/2019   CHOLHDL 3.1 10/03/2019   Lab Results  Component Value Date   WBC 6.8 03/18/2020   HGB 12.2 03/18/2020   HCT 35.6 03/18/2020   MCV 85.0 03/18/2020   PLT 406 (H) 03/18/2020   Lab Results  Component Value Date   IRON 54 09/30/2017   TIBC 373 09/30/2017   FERRITIN 81 09/30/2017   Attestation Statements:   Reviewed by clinician on day of visit: allergies, medications, problem list, medical history, surgical history, family history, social history, and previous encounter notes.   [name], am acting as transcriptionist for [employer], PA-C.  I have reviewed the above documentation for accuracy and completeness, and I agree with the above. Courtney Potash, PA-C

## 2020-04-21 ENCOUNTER — Ambulatory Visit: Payer: BC Managed Care – PPO | Admitting: Internal Medicine

## 2020-04-23 ENCOUNTER — Ambulatory Visit (INDEPENDENT_AMBULATORY_CARE_PROVIDER_SITE_OTHER): Payer: BC Managed Care – PPO | Admitting: Professional

## 2020-04-23 DIAGNOSIS — F411 Generalized anxiety disorder: Secondary | ICD-10-CM

## 2020-04-23 DIAGNOSIS — F331 Major depressive disorder, recurrent, moderate: Secondary | ICD-10-CM

## 2020-04-24 ENCOUNTER — Encounter: Payer: Self-pay | Admitting: Internal Medicine

## 2020-04-24 ENCOUNTER — Ambulatory Visit (INDEPENDENT_AMBULATORY_CARE_PROVIDER_SITE_OTHER): Payer: BC Managed Care – PPO | Admitting: Internal Medicine

## 2020-04-24 ENCOUNTER — Other Ambulatory Visit: Payer: Self-pay

## 2020-04-24 ENCOUNTER — Telehealth: Payer: Self-pay | Admitting: Internal Medicine

## 2020-04-24 VITALS — BP 120/80 | HR 101 | Temp 98.8°F | Wt 232.0 lb

## 2020-04-24 DIAGNOSIS — R059 Cough, unspecified: Secondary | ICD-10-CM | POA: Diagnosis not present

## 2020-04-24 DIAGNOSIS — Z8659 Personal history of other mental and behavioral disorders: Secondary | ICD-10-CM

## 2020-04-24 DIAGNOSIS — R0989 Other specified symptoms and signs involving the circulatory and respiratory systems: Secondary | ICD-10-CM

## 2020-04-24 DIAGNOSIS — J069 Acute upper respiratory infection, unspecified: Secondary | ICD-10-CM | POA: Diagnosis not present

## 2020-04-24 DIAGNOSIS — J029 Acute pharyngitis, unspecified: Secondary | ICD-10-CM

## 2020-04-24 MED ORDER — AZITHROMYCIN 250 MG PO TABS: ORAL_TABLET | ORAL | 0 refills | Status: DC

## 2020-04-24 MED ORDER — HYDROCODONE-HOMATROPINE 5-1.5 MG/5ML PO SYRP: 5.0000 mL | ORAL_SOLUTION | Freq: Four times a day (QID) | ORAL | 0 refills | Status: DC | PRN

## 2020-04-24 NOTE — Telephone Encounter (Signed)
Scheduled

## 2020-04-24 NOTE — Telephone Encounter (Signed)
Sevana Grandinetti [phone]  [name] called to say a few days ago she started having headache, cough, runny nose and sore throat, she tested for COVID at her job on Tuesday and it was negative. Would like to be seen today.  She also said she sorry she missed her appointment on Monday her dad had open heart surgery and she tried to call on Tuesday but did not get answer.

## 2020-04-24 NOTE — Telephone Encounter (Signed)
We can see her.

## 2020-04-29 LAB — RESPIRATORY VIRUS PANEL
Adenovirus B: NOT DETECTED
HUMAN PARAINFLU VIRUS 1: NOT DETECTED
HUMAN PARAINFLU VIRUS 2: NOT DETECTED
HUMAN PARAINFLU VIRUS 3: NOT DETECTED
INFLUENZA A SUBTYPE H1: NOT DETECTED
INFLUENZA A SUBTYPE H3: NOT DETECTED
Influenza A: NOT DETECTED
Influenza B: NOT DETECTED
Metapneumovirus: NOT DETECTED
Respiratory Syncytial Virus A: NOT DETECTED
Respiratory Syncytial Virus B: NOT DETECTED
Rhinovirus: DETECTED — AB

## 2020-04-30 ENCOUNTER — Ambulatory Visit (INDEPENDENT_AMBULATORY_CARE_PROVIDER_SITE_OTHER): Payer: BC Managed Care – PPO | Admitting: Professional

## 2020-04-30 DIAGNOSIS — F411 Generalized anxiety disorder: Secondary | ICD-10-CM | POA: Diagnosis not present

## 2020-04-30 DIAGNOSIS — F330 Major depressive disorder, recurrent, mild: Secondary | ICD-10-CM

## 2020-05-07 ENCOUNTER — Ambulatory Visit (INDEPENDENT_AMBULATORY_CARE_PROVIDER_SITE_OTHER): Payer: BC Managed Care – PPO | Admitting: Professional

## 2020-05-07 ENCOUNTER — Other Ambulatory Visit: Payer: Self-pay

## 2020-05-07 ENCOUNTER — Encounter (INDEPENDENT_AMBULATORY_CARE_PROVIDER_SITE_OTHER): Payer: Self-pay | Admitting: Physician Assistant

## 2020-05-07 ENCOUNTER — Ambulatory Visit (INDEPENDENT_AMBULATORY_CARE_PROVIDER_SITE_OTHER): Payer: BC Managed Care – PPO | Admitting: Physician Assistant

## 2020-05-07 VITALS — BP 116/79 | HR 81 | Temp 98.8°F | Ht 65.0 in | Wt 224.0 lb

## 2020-05-07 DIAGNOSIS — Z9189 Other specified personal risk factors, not elsewhere classified: Secondary | ICD-10-CM

## 2020-05-07 DIAGNOSIS — Z6838 Body mass index (BMI) 38.0-38.9, adult: Secondary | ICD-10-CM

## 2020-05-07 DIAGNOSIS — F3289 Other specified depressive episodes: Secondary | ICD-10-CM

## 2020-05-07 DIAGNOSIS — F331 Major depressive disorder, recurrent, moderate: Secondary | ICD-10-CM

## 2020-05-07 DIAGNOSIS — F411 Generalized anxiety disorder: Secondary | ICD-10-CM | POA: Diagnosis not present

## 2020-05-07 DIAGNOSIS — E559 Vitamin D deficiency, unspecified: Secondary | ICD-10-CM

## 2020-05-07 MED ORDER — BUPROPION HCL ER (SR) 150 MG PO TB12: 150.0000 mg | ORAL_TABLET | Freq: Two times a day (BID) | ORAL | 0 refills | Status: DC

## 2020-05-12 NOTE — Progress Notes (Signed)
Chief Complaint:   OBESITY [name] is here to discuss her progress with her obesity treatment plan along with follow-up of her obesity related diagnoses. [name] is on the Category 3 Plan and states she is following her eating plan approximately 10% of the time. [name] states she is doing 0 minutes 0 times per week.  Today's visit was #: 72 Starting weight: 233 lbs Starting date: 02/06/2018 Today's weight: 224 lbs Today's date: 05/07/2020 Total lbs lost to date: 9 Total lbs lost since last in-office visit: 5  Interim History: [name] reports that she has been doing better overall with the plan, despite some stress at home. She is eating about 5 oz of protein at dinner.  Subjective:   1. Vitamin D deficiency [name] denies nausea, vomiting, or muscle weakness on Vit D.  2. Other depression, with emotional eating [name] is on bupropion, and she denies suicidal or homicidal ideas.  3. At risk for osteoporosis [name] is at higher risk of osteopenia and osteoporosis due to Vitamin D deficiency.   Assessment/Plan:   1. Vitamin D deficiency Low Vitamin D level contributes to fatigue and are associated with obesity, breast, and colon cancer. [name] agreed to continue taking prescription Vitamin D 50,000 IU every week and will follow-up for routine testing of Vitamin D, at least 2-3 times per year to avoid over-replacement.  2. Other depression, with emotional eating Behavior modification techniques were discussed today to help [name] deal with her emotional/non-hunger eating behaviors. We will refill Wellbutrin SR for 1 month. Orders and follow up as documented in patient record.   - buPROPion (WELLBUTRIN SR) 150 MG 12 hr tablet; Take 1 tablet (150 mg total) by mouth 2 (two) times daily.  Dispense: 60 tablet; Refill: 0  3. At risk for osteoporosis [name] was given approximately 15 minutes of osteoporosis prevention counseling today. [name] is at risk for osteopenia and osteoporosis due  to her Vitamin D deficiency. She was encouraged to take her Vitamin D and follow her higher calcium diet and increase strengthening exercise to help strengthen her bones and decrease her risk of osteopenia and osteoporosis.  Repetitive spaced learning was employed today to elicit superior memory formation and behavioral change.  4. Class 2 severe obesity with serious comorbidity and body mass index (BMI) of 38.0 to 38.9 in adult, unspecified obesity type ([name]); today BMI was 37.52 [name] is currently in the action stage of change. As such, her goal is to continue with weight loss efforts. She has agreed to the Category 3 Plan.   Exercise goals: No exercise has been prescribed at this time.  Behavioral modification strategies: increasing lean protein intake and meal planning and cooking strategies.  [name] has agreed to follow-up with our clinic in 2 weeks. She was informed of the importance of frequent follow-up visits to maximize her success with intensive lifestyle modifications for her multiple health conditions.   Objective:   Blood pressure 116/79, pulse 81, temperature 98.8 F (37.1 C), height 5\' 5"  (1.651 m), weight 224 lb (101.6 kg), last menstrual period 04/17/2020, SpO2 99 %, unknown if currently breastfeeding. Body mass index is 37.28 kg/m.  General: Cooperative, alert, well developed, in no acute distress. HEENT: Conjunctivae and lids unremarkable. Cardiovascular: Regular rhythm.  Lungs: Normal work of breathing. Neurologic: No focal deficits.   Lab Results  Component Value Date   CREATININE 0.73 03/18/2020   BUN 12 03/18/2020   NA 139 03/18/2020   K 4.6 03/18/2020   CL 105 03/18/2020  CO2 26 03/18/2020   Lab Results  Component Value Date   ALT 14 03/18/2020   AST 12 03/18/2020   ALKPHOS 51 10/03/2019   BILITOT 0.2 03/18/2020   Lab Results  Component Value Date   HGBA1C 5.8 (H) 03/18/2020   HGBA1C 5.7 (H) 10/03/2019   HGBA1C 5.7 (H) 05/15/2019   HGBA1C 5.8  (H) 01/29/2019   HGBA1C 5.7 (H) 08/30/2018   Lab Results  Component Value Date   INSULIN 29.1 (H) 10/03/2019   INSULIN 19.3 05/15/2019   INSULIN 20.0 01/29/2019   INSULIN 33.0 (H) 08/30/2018   INSULIN 26.2 (H) 02/06/2018   Lab Results  Component Value Date   TSH 1.97 03/18/2020   Lab Results  Component Value Date   CHOL 164 10/03/2019   HDL 53 10/03/2019   LDLCALC 98 10/03/2019   TRIG 69 10/03/2019   CHOLHDL 3.1 10/03/2019   Lab Results  Component Value Date   WBC 6.8 03/18/2020   HGB 12.2 03/18/2020   HCT 35.6 03/18/2020   MCV 85.0 03/18/2020   PLT 406 (H) 03/18/2020   Lab Results  Component Value Date   IRON 54 09/30/2017   TIBC 373 09/30/2017   FERRITIN 81 09/30/2017   Attestation Statements:   Reviewed by clinician on day of visit: allergies, medications, problem list, medical history, surgical history, family history, social history, and previous encounter notes.   [name], am acting as transcriptionist for [employer], PA-C.  I have reviewed the above documentation for accuracy and completeness, and I agree with the above. Courtney Potash, PA-C

## 2020-05-14 ENCOUNTER — Ambulatory Visit: Payer: 59 | Admitting: Professional

## 2020-05-16 ENCOUNTER — Emergency Department (HOSPITAL_COMMUNITY): Admission: EM | Admit: 2020-05-16 | Discharge: 2020-05-16 | Discharge status: Absent without leave | Payer: Self-pay

## 2020-05-16 ENCOUNTER — Other Ambulatory Visit: Payer: Self-pay

## 2020-05-16 ENCOUNTER — Encounter: Payer: Self-pay | Admitting: Internal Medicine

## 2020-05-16 ENCOUNTER — Ambulatory Visit (INDEPENDENT_AMBULATORY_CARE_PROVIDER_SITE_OTHER): Payer: BC Managed Care – PPO | Admitting: Internal Medicine

## 2020-05-16 VITALS — BP 130/80 | HR 103 | Temp 98.8°F | Ht 65.0 in | Wt 225.0 lb

## 2020-05-16 DIAGNOSIS — R059 Cough, unspecified: Secondary | ICD-10-CM | POA: Diagnosis not present

## 2020-05-16 LAB — CBC WITH DIFFERENTIAL/PLATELET
Absolute Monocytes: 755 cells/uL (ref 200–950)
Basophils Absolute: 30 cells/uL (ref 0–200)
Basophils Relative: 0.6 %
Eosinophils Absolute: 0 cells/uL — ABNORMAL LOW (ref 15–500)
Eosinophils Relative: 0 %
HCT: 37.7 % (ref 35.0–45.0)
Hemoglobin: 12.3 g/dL (ref 11.7–15.5)
Lymphs Abs: 1625 cells/uL (ref 850–3900)
MCH: 28.5 pg (ref 27.0–33.0)
MCHC: 32.6 g/dL (ref 32.0–36.0)
MCV: 87.3 fL (ref 80.0–100.0)
MPV: 11.1 fL (ref 7.5–12.5)
Monocytes Relative: 15.1 %
Neutro Abs: 2590 cells/uL (ref 1500–7800)
Neutrophils Relative %: 51.8 %
Platelets: 406 10*3/uL — ABNORMAL HIGH (ref 140–400)
RBC: 4.32 10*6/uL (ref 3.80–5.10)
RDW: 12.3 % (ref 11.0–15.0)
Total Lymphocyte: 32.5 %
WBC: 5 10*3/uL (ref 3.8–10.8)

## 2020-05-16 MED ORDER — CEFTRIAXONE SODIUM 1 G IJ SOLR
1.0000 g | Freq: Once | INTRAMUSCULAR | Status: AC
  Administered 2020-05-16: 1 g via INTRAMUSCULAR

## 2020-05-16 MED ORDER — DOXYCYCLINE HYCLATE 100 MG PO TABS: 100.0000 mg | ORAL_TABLET | Freq: Two times a day (BID) | ORAL | 0 refills | Status: DC

## 2020-05-16 MED ORDER — HYDROCODONE BIT-HOMATROP MBR 5-1.5 MG/5ML PO SOLN: 5.0000 mL | Freq: Three times a day (TID) | ORAL | 0 refills | Status: DC | PRN

## 2020-05-16 MED ORDER — PREDNISONE 10 MG PO TABS: ORAL_TABLET | ORAL | 0 refills | Status: DC

## 2020-05-16 NOTE — Progress Notes (Signed)
   Subjective:    Patient ID: [name], female    DOB: 15-Nov-1980, 40 y.o.   MRN: [id number]  HPI 40 year old Female seen with respiratory congestion. A few days ago, developed headache,cough, runny nose and sore throat. Had negative Covid test at work.  I records indicates she has had 3 COVID-19 immunizations.  Her father has recently had cardiac bypass surgery.  Her husband and children are healthy.  She has a history of allergic rhinitis which is seasonal during the spring.  Her general health is good.  She had a Cesarean section in 2010 and 2017.  Had carpal tunnel surgery in 2011 by Dr. Daylene Katayama.  History of tonsillectomy.  Her OB/GYN is [facility].  She is on oral contraceptives.  She was seen March 1 regarding depression and was placed on low-dose Prozac.  She also takes Wellbutrin per healthy weight clinic.  Says she is feeling better and more energetic on Prozac which is encouraging.   Review of Systems denies fever or shaking chills.  No nausea or vomiting.     Objective:   Physical Exam Blood pressure 120/80 pulse 101 temperature 98.8 degrees pulse oximetry 98% weight 232 pounds BMI 38.61  Skin warm and dry.  No cervical adenopathy.  COVID-19 test was not performed since she had had 1 at work.  Respiratory virus panel was obtained.  She sounded nasally congested.  Pharynx was slightly injected.  TMs clear.  Neck supple.  Chest clear to auscultation.       Assessment & Plan:  Acute upper respiratory infection-respiratory virus panel was positive for rhinovirus  Plan: Patient will be continued on Prozac 10 mg daily.  She will continue with Wellbutrin per healthy weight clinic.  Was given Hycodan 1 teaspoon every 6 hours as needed for cough.  Was treated with Zithromax Z-PAK 2 tabs day 1 followed by 1 tab days 2 through 5.  Rest and drink fluids.  Call if not improving in  48 hours or sooner if worse.

## 2020-05-16 NOTE — Progress Notes (Addendum)
   Subjective:    Patient ID: [name], female    DOB: Jun 08, 1980, 40 y.o.   MRN: [id number]  HPI 40 year old Black Female seen April 7th for a cough. Had tested negative for Covid at work before coming to office. Respiratory virus panel obtained at that visit was positive for Rhinovirus. Her daughter also had a cough around that time.She was treated with a Zithromax Z pak and improved.   Patient called today and said she was worse. Had cough and malaise but no shaking chills. 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 Cough has gotten worse since that time. Says home Covid test this am was negative.    Review of Systems main complaint is cough. Denies headache, dysgeusia, chills, nausea or vomiting     Objective:   Physical Exam T 98.8 degrees, pulse 103, BP 130/80 pulse ox 98% weight 225 pounds BMI 37.44 Looks fatigued and has congested cough,RR 12  No audible wheezing on chest exam but rales and coarse breath sounds LLL.. TMs are clear. Pharynx is clear.      Assessment & Plan:  Possible pneumonia  Acute Lower Respiratory Infection  Hx Rhinovirus on nasal swab testing April 7  Plan:   Rocephin 1 g IM given in office.  Prescribed doxycycline 100 mg twice daily for 10 days.  CBC with diff drawn in office    Monitor pulse oximetry at home with home pulse oximeter.  Have chest x-ray at [facility] to rule out pneumonia.   I have refilled Hycodan: 1 teaspoon every 8 hours as needed for cough.  Rest and drink plenty of fluids.    Quarantine at home until test results are back. Monitor pulse oximetry at home with a portable pulse ox device obtained at pharmacy.    I have tested her for COVID-19 and obtained another Respiratory virus panel.   Prescribed a  short course of prednisone 10 mg tablets starting with 4 tablets day 1 and decreasing by 1 tablet daily- i.e. 4-3-2-1 taper  Time spent reviewing  chart, examining patient, medical decision making and history taking as well as reviewing CBC and CXR results is 30 minutes.   Addendum: April 30th: Phone call to patient:  Radiology at [facility]  has changed their hours. Must present before 5 pm or go through ED.  Patient resting at home. Feels better. Would like for her to go tomorrow and get CXR there at [facility].

## 2020-05-16 NOTE — Patient Instructions (Signed)
Please go for chest x-ray at [facility].  Presented to Radiology Department.  Do not go to the emergency department.  1 g IM Rocephin given in office.  CBC with differential drawn in office and pending.  COVID-19 and respiratory virus panels obtained by nasal swab.  Take doxycycline 100 mg twice daily for 10 days.  Have refilled Hycodan to take 1 teaspoon by mouth every 8 hours as needed for cough.  Rest and drink fluids.  Quarantine at home until test results are back.  We will contact you by phone with chest x-ray results once we receive them.  Take prednisone in tapering course starting with 4 tabs day 1 and decreasing by 1 tablet daily i.e. 4-3-2-1 taper to help with respiratory congestion and cough.

## 2020-05-16 NOTE — Patient Instructions (Signed)
Respiratory virus panel positive for rhinovirus which is the common cold.  Continue on Prozac 10 mg daily for depression as well as Wellbutrin.  Take Zithromax Z-PAK 2 tabs day 1 followed by 1 tab days 2 through 5.  Rest and drink fluids.  Call if not improving in 48 hours or sooner if worse May take Hycodan 1 teaspoon every 6 hours as needed for cough.

## 2020-05-17 ENCOUNTER — Telehealth: Payer: Self-pay | Admitting: Internal Medicine

## 2020-05-17 ENCOUNTER — Encounter: Payer: Self-pay | Admitting: Internal Medicine

## 2020-05-17 LAB — SARS-COV-2 RNA,(COVID-19) QUALITATIVE NAAT: SARS CoV2 RNA: DETECTED — AB

## 2020-05-17 NOTE — Telephone Encounter (Signed)
Tried to call patient this evening. Got voice mail. Left message that Covid-19 test was positive. Needs to quarantine. Has children and husband in the home. Needs to get CXR tomorrow at [facility]. Did not feel well enough to go today. Was tired when I spoke with her earlier today. She tried to go yesterday for CXR after leaving my office but Radiology had closed at [facility] for outpatients. Needs to monitor pulse ox at home.Stay well hydrated. MJB,MD

## 2020-05-17 NOTE — Telephone Encounter (Signed)
I called results of Covid test which is positive to her this evening and left voice mail. She did not answer cell phone. I very much encouraged her to watch pulse ox and get CXR tomorrow at [facility] early in the morning. MJB,MD

## 2020-05-19 ENCOUNTER — Telehealth: Payer: Self-pay

## 2020-05-19 ENCOUNTER — Telehealth: Payer: Self-pay | Admitting: Internal Medicine

## 2020-05-19 ENCOUNTER — Other Ambulatory Visit: Payer: Self-pay

## 2020-05-19 ENCOUNTER — Ambulatory Visit (HOSPITAL_COMMUNITY)
Admission: RE | Admit: 2020-05-19 | Discharge: 2020-05-19 | Disposition: A | Discharge status: Home / Self Care | Payer: BC Managed Care – PPO | Source: Ambulatory Visit | Attending: Internal Medicine | Admitting: Internal Medicine

## 2020-05-19 DIAGNOSIS — R059 Cough, unspecified: Secondary | ICD-10-CM | POA: Diagnosis not present

## 2020-05-19 NOTE — Telephone Encounter (Signed)
Faxed Positive COVID Lab Results and Demographics to [facility] [phone], phone [phone]

## 2020-05-19 NOTE — Telephone Encounter (Signed)
Transition Care Management Unsuccessful Follow-up Telephone Call  Date of discharge and from where:  05/16/2020 from [facility]  Attempts:  1st Attempt  Reason for unsuccessful TCM follow-up call:  Left voice message

## 2020-05-20 LAB — RESPIRATORY VIRUS PANEL

## 2020-05-20 NOTE — Telephone Encounter (Signed)
Transition Care Management Follow-up Telephone Call  Date of discharge and from where: 05/16/2020 from [place]  How have you been since you were released from the hospital? Pt stated that she is feeling okay, she is coughing but no other symptoms  Any questions or concerns? No  Items Reviewed:  Did the pt receive and understand the discharge instructions provided? Yes   Medications obtained and verified? Yes   Other? No   Any new allergies since your discharge? No   Dietary orders reviewed?  n/a  Do you have support at home? Yes   Functional Questionnaire: (I = Independent and D = Dependent) ADLs: I  Bathing/Dressing- I  Meal Prep- I  Eating- I  Maintaining continence- I  Transferring/Ambulation- I  Managing Meds- I   Follow up appointments reviewed:   PCP Hospital f/u appt confirmed? No    Specialist Hospital f/u appt confirmed? No    Are transportation arrangements needed? No   If their condition worsens, is the pt aware to call PCP or go to the Emergency Dept.? Yes  Was the patient provided with contact information for the PCP's office or ED? Yes  Was to pt encouraged to call back with questions or concerns? Yes

## 2020-05-21 ENCOUNTER — Ambulatory Visit: Payer: 59 | Admitting: Professional

## 2020-05-28 ENCOUNTER — Ambulatory Visit (INDEPENDENT_AMBULATORY_CARE_PROVIDER_SITE_OTHER): Payer: BC Managed Care – PPO | Admitting: Physician Assistant

## 2020-05-28 ENCOUNTER — Ambulatory Visit (INDEPENDENT_AMBULATORY_CARE_PROVIDER_SITE_OTHER): Payer: 59 | Admitting: Professional

## 2020-05-28 DIAGNOSIS — F331 Major depressive disorder, recurrent, moderate: Secondary | ICD-10-CM | POA: Diagnosis not present

## 2020-05-28 DIAGNOSIS — F411 Generalized anxiety disorder: Secondary | ICD-10-CM

## 2020-06-04 ENCOUNTER — Ambulatory Visit (INDEPENDENT_AMBULATORY_CARE_PROVIDER_SITE_OTHER): Payer: 59 | Admitting: Professional

## 2020-06-04 DIAGNOSIS — F411 Generalized anxiety disorder: Secondary | ICD-10-CM

## 2020-06-04 DIAGNOSIS — F331 Major depressive disorder, recurrent, moderate: Secondary | ICD-10-CM

## 2020-06-11 ENCOUNTER — Ambulatory Visit: Payer: 59 | Admitting: Professional

## 2020-06-11 ENCOUNTER — Other Ambulatory Visit: Payer: Self-pay

## 2020-06-11 ENCOUNTER — Ambulatory Visit (INDEPENDENT_AMBULATORY_CARE_PROVIDER_SITE_OTHER): Payer: BC Managed Care – PPO | Admitting: Physician Assistant

## 2020-06-11 VITALS — BP 125/84 | HR 95 | Temp 98.1°F | Ht 65.0 in | Wt 217.0 lb

## 2020-06-11 DIAGNOSIS — Z9189 Other specified personal risk factors, not elsewhere classified: Secondary | ICD-10-CM | POA: Diagnosis not present

## 2020-06-11 DIAGNOSIS — Z6838 Body mass index (BMI) 38.0-38.9, adult: Secondary | ICD-10-CM | POA: Diagnosis not present

## 2020-06-11 DIAGNOSIS — F3289 Other specified depressive episodes: Secondary | ICD-10-CM

## 2020-06-11 DIAGNOSIS — R7303 Prediabetes: Secondary | ICD-10-CM | POA: Diagnosis not present

## 2020-06-11 MED ORDER — BUPROPION HCL ER (SR) 150 MG PO TB12: 150.0000 mg | ORAL_TABLET | Freq: Two times a day (BID) | ORAL | 0 refills | Status: DC

## 2020-06-11 NOTE — Progress Notes (Signed)
Chief Complaint:   OBESITY [name] is here to discuss her progress with her obesity treatment plan along with follow-up of her obesity related diagnoses. [name] is on the Category 3 Plan and states she is following her eating plan approximately 80% of the time. [name] states she is walking for 15-45 minutes 3 times per week.  Today's visit was #: 48 Starting weight: 233 lbs Starting date: 02/06/2018 Today's weight: 217 lbs Today's date: 06/11/2020 Total lbs lost to date: 16 Total lbs lost since last in-office visit: 7  Interim History: [name] did well with weight loss. She had COVID last month but she is fully recovered. She is eating breakfast regularly instead of skipping it. She has been exercising more often.  Subjective:   1. Pre-diabetes [name] is not on medications, and her last A1c was 5.8. She denies polyphagia.  2. Other depression, with emotional eating [name] is on bupropion, and her cravings have improved.  3. At risk for diabetes mellitus [name] is at higher than average risk for developing diabetes due to obesity.   Assessment/Plan:   1. Pre-diabetes Ski will continue her meal plan, and will continue to work on weight loss, exercise, and decreasing simple carbohydrates to help decrease the risk of diabetes. We will recheck labs at her next visit.  2. Other depression, with emotional eating Behavior modification techniques were discussed today to help [name] deal with her emotional/non-hunger eating behaviors. We will refill Wellbutrin SR for 1 month. Orders and follow up as documented in patient record.   - buPROPion (WELLBUTRIN SR) 150 MG 12 hr tablet; Take 1 tablet (150 mg total) by mouth 2 (two) times daily.  Dispense: 60 tablet; Refill: 0  3. At risk for diabetes mellitus [name] was given approximately 15 minutes of diabetes education and counseling today. We discussed intensive lifestyle modifications today with an emphasis on weight loss as well as  increasing exercise and decreasing simple carbohydrates in her diet. We also reviewed medication options with an emphasis on risk versus benefit of those discussed.   Repetitive spaced learning was employed today to elicit superior memory formation and behavioral change.  4. Class 2 severe obesity with serious comorbidity and body mass index (BMI) of 38.0 to 38.9 in adult, unspecified obesity type ([place]); today BMI was 37.52 [name] is currently in the action stage of change. As such, her goal is to continue with weight loss efforts. She has agreed to the Category 3 Plan.   Exercise goals: As is.  Behavioral modification strategies: no skipping meals and meal planning and cooking strategies.  [name] has agreed to follow-up with our clinic in 4 weeks. She was informed of the importance of frequent follow-up visits to maximize her success with intensive lifestyle modifications for her multiple health conditions.   Objective:   Blood pressure 125/84, pulse 95, temperature 98.1 F (36.7 C), height 5\' 5"  (1.651 m), weight 217 lb (98.4 kg), SpO2 98 %, unknown if currently breastfeeding. Body mass index is 36.11 kg/m.  General: Cooperative, alert, well developed, in no acute distress. HEENT: Conjunctivae and lids unremarkable. Cardiovascular: Regular rhythm.  Lungs: Normal work of breathing. Neurologic: No focal deficits.   Lab Results  Component Value Date   CREATININE 0.73 03/18/2020   BUN 12 03/18/2020   NA 139 03/18/2020   K 4.6 03/18/2020   CL 105 03/18/2020   CO2 26 03/18/2020   Lab Results  Component Value Date   ALT 14 03/18/2020   AST 12 03/18/2020  ALKPHOS 51 10/03/2019   BILITOT 0.2 03/18/2020   Lab Results  Component Value Date   HGBA1C 5.8 (H) 03/18/2020   HGBA1C 5.7 (H) 10/03/2019   HGBA1C 5.7 (H) 05/15/2019   HGBA1C 5.8 (H) 01/29/2019   HGBA1C 5.7 (H) 08/30/2018   Lab Results  Component Value Date   INSULIN 29.1 (H) 10/03/2019   INSULIN 19.3 05/15/2019    INSULIN 20.0 01/29/2019   INSULIN 33.0 (H) 08/30/2018   INSULIN 26.2 (H) 02/06/2018   Lab Results  Component Value Date   TSH 1.97 03/18/2020   Lab Results  Component Value Date   CHOL 164 10/03/2019   HDL 53 10/03/2019   LDLCALC 98 10/03/2019   TRIG 69 10/03/2019   CHOLHDL 3.1 10/03/2019   Lab Results  Component Value Date   WBC 5.0 05/16/2020   HGB 12.3 05/16/2020   HCT 37.7 05/16/2020   MCV 87.3 05/16/2020   PLT 406 (H) 05/16/2020   Lab Results  Component Value Date   IRON 54 09/30/2017   TIBC 373 09/30/2017   FERRITIN 81 09/30/2017   Attestation Statements:   Reviewed by clinician on day of visit: allergies, medications, problem list, medical history, surgical history, family history, social history, and previous encounter notes.   Wilhemena Durie, am acting as transcriptionist for [employer], PA-C.  I have reviewed the above documentation for accuracy and completeness, and I agree with the above. Abby Potash, PA-C

## 2020-06-17 ENCOUNTER — Other Ambulatory Visit: Payer: Self-pay | Admitting: Internal Medicine

## 2020-06-24 ENCOUNTER — Ambulatory Visit (INDEPENDENT_AMBULATORY_CARE_PROVIDER_SITE_OTHER): Payer: BC Managed Care – PPO | Admitting: Professional

## 2020-06-24 DIAGNOSIS — F411 Generalized anxiety disorder: Secondary | ICD-10-CM | POA: Diagnosis not present

## 2020-06-24 DIAGNOSIS — F331 Major depressive disorder, recurrent, moderate: Secondary | ICD-10-CM

## 2020-07-09 ENCOUNTER — Ambulatory Visit (INDEPENDENT_AMBULATORY_CARE_PROVIDER_SITE_OTHER): Payer: BC Managed Care – PPO | Admitting: Professional

## 2020-07-09 DIAGNOSIS — F411 Generalized anxiety disorder: Secondary | ICD-10-CM

## 2020-07-09 DIAGNOSIS — F331 Major depressive disorder, recurrent, moderate: Secondary | ICD-10-CM

## 2020-07-15 ENCOUNTER — Other Ambulatory Visit: Payer: Self-pay

## 2020-07-15 ENCOUNTER — Encounter (INDEPENDENT_AMBULATORY_CARE_PROVIDER_SITE_OTHER): Payer: Self-pay | Admitting: Physician Assistant

## 2020-07-15 ENCOUNTER — Ambulatory Visit (INDEPENDENT_AMBULATORY_CARE_PROVIDER_SITE_OTHER): Payer: BC Managed Care – PPO | Admitting: Physician Assistant

## 2020-07-15 VITALS — BP 120/74 | HR 101 | Temp 98.0°F | Ht 65.0 in | Wt 216.0 lb

## 2020-07-15 DIAGNOSIS — F3289 Other specified depressive episodes: Secondary | ICD-10-CM

## 2020-07-15 DIAGNOSIS — Z6837 Body mass index (BMI) 37.0-37.9, adult: Secondary | ICD-10-CM

## 2020-07-15 DIAGNOSIS — E785 Hyperlipidemia, unspecified: Secondary | ICD-10-CM

## 2020-07-15 DIAGNOSIS — Z9189 Other specified personal risk factors, not elsewhere classified: Secondary | ICD-10-CM | POA: Diagnosis not present

## 2020-07-15 DIAGNOSIS — R7303 Prediabetes: Secondary | ICD-10-CM | POA: Diagnosis not present

## 2020-07-15 DIAGNOSIS — E559 Vitamin D deficiency, unspecified: Secondary | ICD-10-CM | POA: Diagnosis not present

## 2020-07-15 MED ORDER — ERGOCALCIFEROL 1.25 MG (50000 UT) PO CAPS: 50000.0000 [IU] | ORAL_CAPSULE | ORAL | 1 refills | Status: DC

## 2020-07-15 MED ORDER — BUPROPION HCL ER (SR) 150 MG PO TB12: 150.0000 mg | ORAL_TABLET | Freq: Two times a day (BID) | ORAL | 0 refills | Status: DC

## 2020-07-16 LAB — COMPREHENSIVE METABOLIC PANEL
ALT: 20 IU/L (ref 0–32)
AST: 17 IU/L (ref 0–40)
Albumin/Globulin Ratio: 1.7 (ref 1.2–2.2)
Albumin: 4.5 g/dL (ref 3.8–4.8)
Alkaline Phosphatase: 57 IU/L (ref 44–121)
BUN/Creatinine Ratio: 15 (ref 9–23)
BUN: 12 mg/dL (ref 6–24)
Bilirubin Total: 0.4 mg/dL (ref 0.0–1.2)
CO2: 21 mmol/L (ref 20–29)
Calcium: 9.7 mg/dL (ref 8.7–10.2)
Chloride: 103 mmol/L (ref 96–106)
Creatinine, Ser: 0.78 mg/dL (ref 0.57–1.00)
Globulin, Total: 2.7 g/dL (ref 1.5–4.5)
Glucose: 110 mg/dL — ABNORMAL HIGH (ref 65–99)
Potassium: 4.3 mmol/L (ref 3.5–5.2)
Sodium: 138 mmol/L (ref 134–144)
Total Protein: 7.2 g/dL (ref 6.0–8.5)
eGFR: 98 mL/min/{1.73_m2} (ref >59)

## 2020-07-16 LAB — LIPID PANEL
Chol/HDL Ratio: 2.6 ratio (ref 0.0–4.4)
Cholesterol, Total: 140 mg/dL (ref 100–199)
HDL: 54 mg/dL (ref >39)
LDL Chol Calc (NIH): 72 mg/dL (ref 0–99)
Triglycerides: 67 mg/dL (ref 0–149)
VLDL Cholesterol Cal: 14 mg/dL (ref 5–40)

## 2020-07-16 LAB — HEMOGLOBIN A1C
Est. average glucose Bld gHb Est-mCnc: 114 mg/dL
Hgb A1c MFr Bld: 5.6 % (ref 4.8–5.6)

## 2020-07-16 LAB — INSULIN, RANDOM: INSULIN: 18.4 u[IU]/mL (ref 2.6–24.9)

## 2020-07-16 LAB — VITAMIN D 25 HYDROXY (VIT D DEFICIENCY, FRACTURES): Vit D, 25-Hydroxy: 71 ng/mL (ref 30.0–100.0)

## 2020-07-17 NOTE — Progress Notes (Signed)
Chief Complaint:   OBESITY [name] is here to discuss her progress with her obesity treatment plan along with follow-up of her obesity related diagnoses. [name] is on the Category 3 Plan and states she is following her eating plan approximately 90% of the time. [name] states she is walking for 30 minutes 3 times per week, and doing HIT for 30-60 minutes 2 times per week.  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 She is drinking less water. She has not been prepping her meals and has been skipping meals.  Subjective:   1. Dyslipidemia [name] is not on medication. She has a family history of hyperlipidemia. She is exercising regularly.  2. Pre-diabetes [name] is not on medications, and she denies polyphagia. She is exercising regularly.  3. Vitamin D deficiency [name] is on Vit D, and she denies nausea, vomiting, or muscle weakness.  4. Other depression, with emotional eating [name] notes her cravings are worse around her menstrual cycle.  5. At risk for impaired metabolic function [name] is at increased risk for impaired metabolic function due to skipping meals.  Assessment/Plan:   1. Dyslipidemia Cardiovascular risk and specific lipid/LDL goals reviewed. We discussed several lifestyle modifications today. We will check labs today. Shena will continue to work on diet, exercise and weight loss efforts. Orders and follow up as documented in patient record.   Counseling Intensive lifestyle modifications are the first line treatment for this issue. Dietary changes: Increase soluble fiber. Decrease simple carbohydrates. Exercise changes: Moderate to vigorous-intensity aerobic activity 150 minutes per week if tolerated. Lipid-lowering medications: see  documented in medical record.  - Lipid panel  2. Pre-diabetes Avital will continue her meal plan, exercise, and decreasing simple carbohydrates to help decrease the risk of diabetes. We will check labs today.  - Comprehensive metabolic panel - Hemoglobin A1c - Insulin, random  3. Vitamin D deficiency Low Vitamin D level contributes to fatigue and are associated with obesity, breast, and colon cancer. We will check labs today, and we will refill prescription Vitamin D for 90 days with 1 refill. [name] will follow-up for routine testing of Vitamin D, at least 2-3 times per year to avoid over-replacement.  - ergocalciferol (DRISDOL) 1.25 MG (50000 UT) capsule; Take 1 capsule (50,000 Units total) by mouth once a week.  Dispense: 12 capsule; Refill: 1 - VITAMIN D 25 Hydroxy (Vit-D Deficiency, Fractures)  4. Other depression, with emotional eating Behavior modification techniques were discussed today to help [name] deal with her emotional/non-hunger eating behaviors. We will refill Wellbutrin SR for 1 month. Orders and follow up as documented in patient record.   - buPROPion (WELLBUTRIN SR) 150 MG 12 hr tablet; Take 1 tablet (150 mg total) by mouth 2 (two) times daily.  Dispense: 60 tablet; Refill: 0  5. At risk for impaired metabolic function [name] was given approximately 15 minutes of impaired  metabolic function prevention counseling today. We discussed intensive lifestyle modifications today with an emphasis on specific nutrition and exercise instructions and strategies.   Repetitive spaced learning was employed today to elicit superior memory formation and behavioral change.  6. Class 2 severe obesity with serious comorbidity and body mass index (BMI) of 37.0 to 37.9 in adult, unspecified obesity type (HCC) [name] is  currently in the action stage of change. As such, her goal is to continue with weight loss efforts. She has agreed to the Category 3 Plan.   Exercise goals: As  is.  Behavioral modification strategies: no skipping meals and meal planning and cooking strategies.  [name] has agreed to follow-up with our clinic in 3 to 4 weeks. She was informed of the importance of frequent follow-up visits to maximize her success with intensive lifestyle modifications for her multiple health conditions.   [name] was informed we would discuss her lab results at her next visit unless there is a critical issue that needs to be addressed sooner. [name] agreed to keep her next visit at the agreed upon time to discuss these results.  Objective:   Blood pressure 120/74, pulse (!) 101, temperature 98 F (36.7 C), height 5\' 5"  (1.651 m), weight 216 lb (98 kg), last menstrual period 06/29/2020, SpO2 97 %, unknown if currently breastfeeding. Body mass index is 35.94 kg/m.  General: Cooperative, alert, well developed, in no acute distress. HEENT: Conjunctivae and lids unremarkable. Cardiovascular: Regular rhythm.  Lungs: Normal work of breathing. Neurologic: No focal deficits.   Lab Results  Component Value Date   CREATININE 0.78 07/15/2020   BUN 12 07/15/2020   NA 138 07/15/2020   K 4.3 07/15/2020   CL 103 07/15/2020   CO2 21 07/15/2020   Lab Results  Component Value Date   ALT 20 07/15/2020   AST 17 07/15/2020   ALKPHOS 57 07/15/2020   BILITOT 0.4 07/15/2020   Lab Results  Component Value Date   HGBA1C 5.6 07/15/2020   HGBA1C 5.8 (H) 03/18/2020   HGBA1C 5.7 (H) 10/03/2019   HGBA1C 5.7 (H) 05/15/2019   HGBA1C 5.8 (H) 01/29/2019   Lab Results  Component Value Date   INSULIN 18.4 07/15/2020   INSULIN 29.1 (H) 10/03/2019   INSULIN 19.3 05/15/2019   INSULIN 20.0 01/29/2019   INSULIN 33.0 (H) 08/30/2018   Lab Results  Component Value Date   TSH 1.97 03/18/2020   Lab Results  Component Value Date   CHOL 140 07/15/2020   HDL 54 07/15/2020   LDLCALC 72 07/15/2020   TRIG 67 07/15/2020   CHOLHDL 2.6 07/15/2020   Lab Results  Component Value Date    VD25OH 71.0 07/15/2020   VD25OH 34 03/18/2020   VD25OH 56.1 10/03/2019   Lab Results  Component Value Date   WBC 5.0 05/16/2020   HGB 12.3 05/16/2020   HCT 37.7 05/16/2020   MCV 87.3 05/16/2020   PLT 406 (H) 05/16/2020   Lab Results  Component Value Date   IRON 54 09/30/2017   TIBC 373 09/30/2017   FERRITIN 81 09/30/2017   Attestation Statements:   Reviewed by clinician on day of visit: allergies, medications, problem list, medical history, surgical history, family history, social history, and previous encounter notes.   Wilhemena Durie, am acting as transcriptionist for [employer], PA-C.  I have reviewed the above documentation for accuracy and completeness, and I agree with the above. Abby Potash, PA-C

## 2020-07-23 ENCOUNTER — Ambulatory Visit (INDEPENDENT_AMBULATORY_CARE_PROVIDER_SITE_OTHER): Payer: BC Managed Care – PPO | Admitting: Professional

## 2020-07-23 DIAGNOSIS — F331 Major depressive disorder, recurrent, moderate: Secondary | ICD-10-CM | POA: Diagnosis not present

## 2020-07-23 DIAGNOSIS — F411 Generalized anxiety disorder: Secondary | ICD-10-CM | POA: Diagnosis not present

## 2020-08-06 ENCOUNTER — Ambulatory Visit (INDEPENDENT_AMBULATORY_CARE_PROVIDER_SITE_OTHER): Payer: BC Managed Care – PPO | Admitting: Physician Assistant

## 2020-08-06 ENCOUNTER — Other Ambulatory Visit: Payer: Self-pay

## 2020-08-06 ENCOUNTER — Ambulatory Visit: Payer: BC Managed Care – PPO | Admitting: Professional

## 2020-08-06 ENCOUNTER — Encounter (INDEPENDENT_AMBULATORY_CARE_PROVIDER_SITE_OTHER): Payer: Self-pay | Admitting: Physician Assistant

## 2020-08-06 VITALS — BP 114/72 | HR 86 | Temp 98.1°F | Ht 65.0 in | Wt 217.0 lb

## 2020-08-06 DIAGNOSIS — R7303 Prediabetes: Secondary | ICD-10-CM | POA: Diagnosis not present

## 2020-08-06 DIAGNOSIS — Z6838 Body mass index (BMI) 38.0-38.9, adult: Secondary | ICD-10-CM | POA: Diagnosis not present

## 2020-08-06 DIAGNOSIS — E559 Vitamin D deficiency, unspecified: Secondary | ICD-10-CM | POA: Diagnosis not present

## 2020-08-06 DIAGNOSIS — Z9189 Other specified personal risk factors, not elsewhere classified: Secondary | ICD-10-CM

## 2020-08-06 MED ORDER — VITAMIN D3 125 MCG (5000 UT) PO CAPS: 5000.0000 [IU] | ORAL_CAPSULE | Freq: Every day | ORAL | 0 refills | Status: AC

## 2020-08-13 NOTE — Progress Notes (Signed)
Chief Complaint:   OBESITY [name] is here to discuss her progress with her obesity treatment plan along with follow-up of her obesity related diagnoses. [name] is on the Category 3 Plan and states she is following her eating plan approximately 50% of the time. [name] states she is walking for 30-45 minutes 4 times per week.  Today's visit was #: 49 Starting weight: 233 lbs Starting date: 02/06/2018 Today's weight: 217 lbs Today's date: 08/06/2020 Total lbs lost to date: 16 Total lbs lost since last in-office visit: 0  Interim History: [name] was on vacation for a week and she did not follow the plan well. She has been back for a week and she has been challenged with getting back on the plan.  Subjective:   1. Pre-diabetes [name]'s last A1c was 5.6, which improved from 5.7. She is not on medications, and she denies polyphagia. I discussed labs with the patient today.  2. Vitamin D deficiency [name] is on Vit D weekly, and she is at risk for over-replacement. I discussed labs with the patient today.  3. At risk for diabetes mellitus [name] is at higher than average risk for developing diabetes due to obesity.   Assessment/Plan:   1. Pre-diabetes [name] will continue her meal plan, exercise, and decreasing simple carbohydrates to help decrease the risk of diabetes.   2. Vitamin D deficiency Low Vitamin D level contributes to fatigue and are associated with obesity, breast, and colon cancer. [name] agreed to change to OTC Vitamin D 5,000 units daily and she will discontinue prescription Vit D weekly. She will follow-up for routine testing of Vitamin D, at least 2-3 times per year to avoid over-replacement.  - Cholecalciferol (VITAMIN D3) 125 MCG (5000 UT) CAPS; Take 1 capsule (5,000 Units total) by mouth daily.  Dispense: 30 capsule; Refill: 0  3. At risk for diabetes mellitus [name] was given approximately 15 minutes of diabetes education and counseling today. We discussed  intensive lifestyle modifications today with an emphasis on weight loss as well as increasing exercise and decreasing simple carbohydrates in her diet. We also reviewed medication options with an emphasis on risk versus benefit of those discussed.   Repetitive spaced learning was employed today to elicit superior memory formation and behavioral change.  4. Class 2 severe obesity with serious comorbidity and body mass index (BMI) of 38.0 to 38.9 in adult, unspecified obesity type ([name]); today BMI was 36.11 [name] is currently in the action stage of change. As such, her goal is to continue with weight loss efforts. She has agreed to the Category 3 Plan.   Exercise goals: As is.  Behavioral modification strategies: no skipping meals and meal planning and cooking strategies.  [name] has agreed to follow-up with our clinic in 3 weeks. She was informed of the importance of frequent follow-up visits to maximize her success with intensive lifestyle modifications for her multiple health conditions.   Objective:   Blood pressure 114/72, pulse 86, temperature 98.1 F (36.7 C), height '5\' 5"'$  (1.651 m), weight 217 lb (98.4 kg), last menstrual period 07/25/2020, SpO2 98 %, unknown if currently breastfeeding. Body mass index is 36.11 kg/m.  General: Cooperative, alert, well developed, in no acute distress. HEENT: Conjunctivae and lids unremarkable. Cardiovascular: Regular rhythm.  Lungs: Normal work of breathing. Neurologic: No focal deficits.   Lab Results  Component Value Date   CREATININE 0.78 07/15/2020   BUN 12 07/15/2020   NA 138 07/15/2020   K 4.3 07/15/2020  CL 103 07/15/2020   CO2 21 07/15/2020   Lab Results  Component Value Date   ALT 20 07/15/2020   AST 17 07/15/2020   ALKPHOS 57 07/15/2020   BILITOT 0.4 07/15/2020   Lab Results  Component Value Date   HGBA1C 5.6 07/15/2020   HGBA1C 5.8 (H) 03/18/2020   HGBA1C 5.7 (H) 10/03/2019   HGBA1C 5.7 (H) 05/15/2019   HGBA1C 5.8 (H)  01/29/2019   Lab Results  Component Value Date   INSULIN 18.4 07/15/2020   INSULIN 29.1 (H) 10/03/2019   INSULIN 19.3 05/15/2019   INSULIN 20.0 01/29/2019   INSULIN 33.0 (H) 08/30/2018   Lab Results  Component Value Date   TSH 1.97 03/18/2020   Lab Results  Component Value Date   CHOL 140 07/15/2020   HDL 54 07/15/2020   LDLCALC 72 07/15/2020   TRIG 67 07/15/2020   CHOLHDL 2.6 07/15/2020   Lab Results  Component Value Date   VD25OH 71.0 07/15/2020   VD25OH 34 03/18/2020   VD25OH 56.1 10/03/2019   Lab Results  Component Value Date   WBC 5.0 05/16/2020   HGB 12.3 05/16/2020   HCT 37.7 05/16/2020   MCV 87.3 05/16/2020   PLT 406 (H) 05/16/2020   Lab Results  Component Value Date   IRON 54 09/30/2017   TIBC 373 09/30/2017   FERRITIN 81 09/30/2017   Attestation Statements:   Reviewed by clinician on day of visit: allergies, medications, problem list, medical history, surgical history, family history, social history, and previous encounter notes.   Wilhemena Durie, am acting as transcriptionist for [employer], PA-C.  I have reviewed the above documentation for accuracy and completeness, and I agree with the above. Abby Potash, PA-C

## 2020-08-25 ENCOUNTER — Ambulatory Visit (INDEPENDENT_AMBULATORY_CARE_PROVIDER_SITE_OTHER): Payer: BC Managed Care – PPO | Admitting: Professional

## 2020-08-25 DIAGNOSIS — F411 Generalized anxiety disorder: Secondary | ICD-10-CM

## 2020-08-25 DIAGNOSIS — F331 Major depressive disorder, recurrent, moderate: Secondary | ICD-10-CM | POA: Diagnosis not present

## 2020-08-28 ENCOUNTER — Telehealth: Payer: Self-pay | Admitting: Internal Medicine

## 2020-08-28 NOTE — Telephone Encounter (Signed)
[name] [phone]  [name] called to say that her insurance was saying that her visit for her xray was billed as out patient and was not paying. I have looked into it, not sure why, have advised patient to call [place], we are not responsible for their billing.

## 2020-09-03 ENCOUNTER — Ambulatory Visit (INDEPENDENT_AMBULATORY_CARE_PROVIDER_SITE_OTHER): Payer: BC Managed Care – PPO | Admitting: Physician Assistant

## 2020-09-08 ENCOUNTER — Encounter (INDEPENDENT_AMBULATORY_CARE_PROVIDER_SITE_OTHER): Payer: Self-pay | Admitting: Bariatrics

## 2020-09-08 ENCOUNTER — Other Ambulatory Visit: Payer: Self-pay

## 2020-09-08 ENCOUNTER — Ambulatory Visit (INDEPENDENT_AMBULATORY_CARE_PROVIDER_SITE_OTHER): Payer: BC Managed Care – PPO | Admitting: Bariatrics

## 2020-09-08 VITALS — BP 110/75 | HR 89 | Temp 98.4°F | Ht 65.0 in | Wt 215.0 lb

## 2020-09-08 DIAGNOSIS — R7303 Prediabetes: Secondary | ICD-10-CM | POA: Diagnosis not present

## 2020-09-08 DIAGNOSIS — F3289 Other specified depressive episodes: Secondary | ICD-10-CM | POA: Diagnosis not present

## 2020-09-08 DIAGNOSIS — Z6838 Body mass index (BMI) 38.0-38.9, adult: Secondary | ICD-10-CM | POA: Diagnosis not present

## 2020-09-08 DIAGNOSIS — Z9189 Other specified personal risk factors, not elsewhere classified: Secondary | ICD-10-CM

## 2020-09-08 MED ORDER — BUPROPION HCL ER (SR) 150 MG PO TB12: 150.0000 mg | ORAL_TABLET | Freq: Two times a day (BID) | ORAL | 0 refills | Status: DC

## 2020-09-09 NOTE — Progress Notes (Signed)
Chief Complaint:   OBESITY [name] is here to discuss her progress with her obesity treatment plan along with follow-up of her obesity related diagnoses. [name] is on the Category 3 Plan and states she is following her eating plan approximately 80% of the time. [name] states she is doing walking for 30-45 minutes 3 times per week and extra moving or 45 minutes 2 times per week.  Today's visit was #: 20 Starting weight: 233 lbs Starting date: 02/06/2018 Today's weight: 215 lbs Today's date: 09/08/2020 Total lbs lost to date: 18 lbs Total lbs lost since last in-office visit: 2 lbs  Interim History: [name] is down an additional 2 lbs  since her last visit. She has not been drinking enough water.  Subjective:   1. Other depression, with emotional eating [name] is currently taking her medication as directed.  2. Pre-diabetes [name] is currently not on medications.   Lab Results  Component Value Date   HGBA1C 5.6 07/15/2020   Lab Results  Component Value Date   INSULIN 18.4 07/15/2020   INSULIN 29.1 (H) 10/03/2019   INSULIN 19.3 05/15/2019   INSULIN 20.0 01/29/2019   INSULIN 33.0 (H) 08/30/2018    3. At risk of diabetes mellitus [name] is at risk of diabetes mellitus due to Pre-diabetes.  Assessment/Plan:   1. Other depression, with emotional eating Behavior modification techniques were discussed today to help [name] deal with her emotional/non-hunger eating behaviors.  Orders and follow up as documented in patient record.   We will refill Wellbutrin SR 150 mg 1 tablet twice daily for 1 month with no refills. She will have healthy snacks on hand.  - buPROPion (WELLBUTRIN SR) 150 MG 12 hr tablet; Take 1 tablet (150 mg total) by mouth 2 (two) times daily.  Dispense: 60 tablet; Refill: 0  2. Pre-diabetes [name] will continue to work on weight loss, exercise, and decreasing simple carbohydrates to help decrease the risk of diabetes.   3. At risk of diabetes mellitus Good  blood sugar control is important to decrease the likelihood of diabetic complications such as nephropathy, neuropathy, limb loss, blindness, coronary artery disease, and death. Intensive lifestyle modification including diet, exercise and weight loss are the first line of treatment for diabetes.    4. Obesity, current BMI 35.8 [name] is currently in the action stage of change. As such, her goal is to continue with weight loss efforts. She has agreed to the Category 3 Plan.   [name] will continue meal planning. She will remain adherent to the plan (80-85%). She will increase H2O and protein.  Exercise goals:  As is.  Behavioral modification strategies: increasing lean protein intake, decreasing simple carbohydrates, increasing vegetables, increasing water intake, decreasing eating out, no skipping meals, meal planning and cooking strategies, keeping healthy foods in the home, and planning for success.  [name] has agreed to follow-up with our clinic in 3-4 weeks with myself or Mina Marble, NP. She was informed of the importance of frequent follow-up visits to maximize her success with intensive lifestyle modifications for her multiple health conditions.   Objective:   Blood pressure 110/75, pulse 89, temperature 98.4 F (36.9 C), height '5\' 5"'$  (1.651 m), weight 215 lb (97.5 kg), SpO2 98 %, unknown if currently breastfeeding. Body mass index is 35.78 kg/m.  General: Cooperative, alert, well developed, in no acute distress. HEENT: Conjunctivae and lids unremarkable. Cardiovascular: Regular rhythm.  Lungs: Normal work of breathing. Neurologic: No focal deficits.   Lab Results  Component Value  Date   CREATININE 0.78 07/15/2020   BUN 12 07/15/2020   NA 138 07/15/2020   K 4.3 07/15/2020   CL 103 07/15/2020   CO2 21 07/15/2020   Lab Results  Component Value Date   ALT 20 07/15/2020   AST 17 07/15/2020   ALKPHOS 57 07/15/2020   BILITOT 0.4 07/15/2020   Lab Results  Component Value  Date   HGBA1C 5.6 07/15/2020   HGBA1C 5.8 (H) 03/18/2020   HGBA1C 5.7 (H) 10/03/2019   HGBA1C 5.7 (H) 05/15/2019   HGBA1C 5.8 (H) 01/29/2019   Lab Results  Component Value Date   INSULIN 18.4 07/15/2020   INSULIN 29.1 (H) 10/03/2019   INSULIN 19.3 05/15/2019   INSULIN 20.0 01/29/2019   INSULIN 33.0 (H) 08/30/2018   Lab Results  Component Value Date   TSH 1.97 03/18/2020   Lab Results  Component Value Date   CHOL 140 07/15/2020   HDL 54 07/15/2020   LDLCALC 72 07/15/2020   TRIG 67 07/15/2020   CHOLHDL 2.6 07/15/2020   Lab Results  Component Value Date   VD25OH 71.0 07/15/2020   VD25OH 34 03/18/2020   VD25OH 56.1 10/03/2019   Lab Results  Component Value Date   WBC 5.0 05/16/2020   HGB 12.3 05/16/2020   HCT 37.7 05/16/2020   MCV 87.3 05/16/2020   PLT 406 (H) 05/16/2020   Lab Results  Component Value Date   IRON 54 09/30/2017   TIBC 373 09/30/2017   FERRITIN 81 09/30/2017   Attestation Statements:   Reviewed by clinician on day of visit: allergies, medications, problem list, medical history, surgical history, family history, social history, and previous encounter notes.  I, Lizbeth Bark, RMA, am acting as Location manager for [employer], DO.   I have reviewed the above documentation for accuracy and completeness, and I agree with the above. Jearld Lesch, DO

## 2020-09-10 ENCOUNTER — Encounter (INDEPENDENT_AMBULATORY_CARE_PROVIDER_SITE_OTHER): Payer: Self-pay | Admitting: Bariatrics

## 2020-09-17 ENCOUNTER — Ambulatory Visit (INDEPENDENT_AMBULATORY_CARE_PROVIDER_SITE_OTHER): Payer: BC Managed Care – PPO | Admitting: Professional

## 2020-09-17 DIAGNOSIS — F331 Major depressive disorder, recurrent, moderate: Secondary | ICD-10-CM

## 2020-09-17 DIAGNOSIS — F411 Generalized anxiety disorder: Secondary | ICD-10-CM

## 2020-10-06 ENCOUNTER — Other Ambulatory Visit: Payer: Self-pay

## 2020-10-06 ENCOUNTER — Ambulatory Visit (INDEPENDENT_AMBULATORY_CARE_PROVIDER_SITE_OTHER): Payer: BC Managed Care – PPO | Admitting: Adult Health

## 2020-10-06 ENCOUNTER — Encounter (INDEPENDENT_AMBULATORY_CARE_PROVIDER_SITE_OTHER): Payer: Self-pay | Admitting: Adult Health

## 2020-10-06 VITALS — BP 119/65 | HR 80 | Temp 98.0°F | Ht 65.0 in | Wt 215.0 lb

## 2020-10-06 DIAGNOSIS — F3289 Other specified depressive episodes: Secondary | ICD-10-CM | POA: Diagnosis not present

## 2020-10-06 DIAGNOSIS — Z6838 Body mass index (BMI) 38.0-38.9, adult: Secondary | ICD-10-CM

## 2020-10-06 DIAGNOSIS — R7303 Prediabetes: Secondary | ICD-10-CM

## 2020-10-06 DIAGNOSIS — F32A Depression, unspecified: Secondary | ICD-10-CM | POA: Insufficient documentation

## 2020-10-06 NOTE — Progress Notes (Signed)
Chief Complaint:   OBESITY [name] is here to discuss her progress with her obesity treatment plan along with follow-up of her obesity related diagnoses. [name] is on the Category 3 Plan and states she is following her eating plan approximately 60% of the time. [name] states she is walking for 30-60 minutes 3 times per week.  Today's visit was #: 2 Starting weight: 233 lbs Starting date: 02/06/2018 Today's weight: 215 lbs Today's date: 10/06/2020 Total lbs lost to date: 18 lbs Total lbs lost since last in-office visit: 0  Interim History:  [name] says that since the fall semester has started, she has been challenged to remain consistent with the Category 3 meal plan.   She is works at [employer].   Both her kids are in school- kindergarten and 5th grade. Her husband is Capt in the [employer]. Breakfast - apple, lean cuisine.  Lunch - meat sandwich or lean cuisine or [employer] when in office.   Dinner - take out or grilled meat with vegetables.   Snacks - yogurt or nuts (cashews).  Subjective:   1. Pre-diabetes On 07/15/2020, A1c was 5.6, insulin level 18.4, BG 110. She reports less polyphagia with bupropion SR 150 mg BID.  Lab Results  Component Value Date   HGBA1C 5.6 07/15/2020   Lab Results  Component Value Date   INSULIN 18.4 07/15/2020   INSULIN 29.1 (H) 10/03/2019   INSULIN 19.3 05/15/2019   INSULIN 20.0 01/29/2019   INSULIN 33.0 (H) 08/30/2018   2. Other depression, with emotional eating She is on bupropion SR 150 mg BID - she reports decrease in cravings. She denies SI/HI.  Assessment/Plan:   1. Pre-diabetes Check labs at next office visit.  2. Other depression, with emotional eating Check labs at next office visit.  3. Obesity, current BMI 35.8  [name] is currently in the action stage of change. As such, her goal is to continue with weight loss efforts. She has agreed to the Category 3 Plan.   Exercise goals:  As is.  Behavioral  modification strategies: increasing lean protein intake, decreasing simple carbohydrates, increasing water intake, meal planning and cooking strategies, keeping healthy foods in the home, and planning for success.  Check fasting labs at next office visit.  [name] has agreed to follow-up with our clinic in 3-4 weeks, fasting. She was informed of the importance of frequent follow-up visits to maximize her success with intensive lifestyle modifications for her multiple health conditions.   Objective:   Blood pressure 119/65, pulse 80, temperature 98 F (36.7 C), height '5\' 5"'$  (1.651 m), weight 215 lb (97.5 kg), SpO2 98 %, unknown if currently breastfeeding. Body mass index is 35.78 kg/m.  General: Cooperative, alert, well developed, in no acute distress. HEENT: Conjunctivae and lids unremarkable. Cardiovascular: Regular rhythm.  Lungs: Normal work of breathing. Neurologic: No focal deficits.   Lab Results  Component Value Date   CREATININE 0.78 07/15/2020   BUN 12 07/15/2020   NA 138 07/15/2020   K 4.3 07/15/2020   CL 103 07/15/2020   CO2 21 07/15/2020   Lab Results  Component Value Date   ALT 20 07/15/2020   AST 17 07/15/2020   ALKPHOS 57 07/15/2020   BILITOT 0.4 07/15/2020   Lab Results  Component Value Date   HGBA1C 5.6 07/15/2020   HGBA1C 5.8 (H) 03/18/2020   HGBA1C 5.7 (H) 10/03/2019   HGBA1C 5.7 (H) 05/15/2019   HGBA1C 5.8 (H) 01/29/2019   Lab Results  Component Value Date   INSULIN 18.4 07/15/2020   INSULIN 29.1 (H) 10/03/2019   INSULIN 19.3 05/15/2019   INSULIN 20.0 01/29/2019   INSULIN 33.0 (H) 08/30/2018   Lab Results  Component Value Date   TSH 1.97 03/18/2020   Lab Results  Component Value Date   CHOL 140 07/15/2020   HDL 54 07/15/2020   LDLCALC 72 07/15/2020   TRIG 67 07/15/2020   CHOLHDL 2.6 07/15/2020   Lab Results  Component Value Date   VD25OH 71.0 07/15/2020   VD25OH 34 03/18/2020   VD25OH 56.1 10/03/2019   Lab Results  Component  Value Date   WBC 5.0 05/16/2020   HGB 12.3 05/16/2020   HCT 37.7 05/16/2020   MCV 87.3 05/16/2020   PLT 406 (H) 05/16/2020   Lab Results  Component Value Date   IRON 54 09/30/2017   TIBC 373 09/30/2017   FERRITIN 81 09/30/2017   Attestation Statements:   Reviewed by clinician on day of visit: allergies, medications, problem list, medical history, surgical history, family history, social history, and previous encounter notes.  Time spent on visit including pre-visit chart review and post-visit care and charting was 30 minutes.   I, Water quality scientist, CMA, am acting as Location manager for Mina Marble, NP.  I have reviewed the above documentation for accuracy and completeness, and I agree with the above. -  Reinaldo Helt d. Barbee Mamula, NP-C

## 2020-10-08 ENCOUNTER — Ambulatory Visit (INDEPENDENT_AMBULATORY_CARE_PROVIDER_SITE_OTHER): Payer: BC Managed Care – PPO | Admitting: Professional

## 2020-10-08 DIAGNOSIS — F331 Major depressive disorder, recurrent, moderate: Secondary | ICD-10-CM

## 2020-10-08 DIAGNOSIS — F411 Generalized anxiety disorder: Secondary | ICD-10-CM | POA: Diagnosis not present

## 2020-10-29 ENCOUNTER — Ambulatory Visit (INDEPENDENT_AMBULATORY_CARE_PROVIDER_SITE_OTHER): Payer: BC Managed Care – PPO | Admitting: Professional

## 2020-10-29 DIAGNOSIS — F331 Major depressive disorder, recurrent, moderate: Secondary | ICD-10-CM

## 2020-10-29 DIAGNOSIS — F411 Generalized anxiety disorder: Secondary | ICD-10-CM

## 2020-11-05 ENCOUNTER — Other Ambulatory Visit: Payer: Self-pay

## 2020-11-05 ENCOUNTER — Ambulatory Visit (INDEPENDENT_AMBULATORY_CARE_PROVIDER_SITE_OTHER): Payer: BC Managed Care – PPO | Admitting: Adult Health

## 2020-11-05 ENCOUNTER — Encounter (INDEPENDENT_AMBULATORY_CARE_PROVIDER_SITE_OTHER): Payer: Self-pay | Admitting: Adult Health

## 2020-11-05 VITALS — BP 107/71 | HR 67 | Temp 98.5°F | Ht 65.0 in | Wt 215.0 lb

## 2020-11-05 DIAGNOSIS — E559 Vitamin D deficiency, unspecified: Secondary | ICD-10-CM | POA: Diagnosis not present

## 2020-11-05 DIAGNOSIS — Z9189 Other specified personal risk factors, not elsewhere classified: Secondary | ICD-10-CM | POA: Diagnosis not present

## 2020-11-05 DIAGNOSIS — F3289 Other specified depressive episodes: Secondary | ICD-10-CM

## 2020-11-05 DIAGNOSIS — Z6838 Body mass index (BMI) 38.0-38.9, adult: Secondary | ICD-10-CM

## 2020-11-05 DIAGNOSIS — R7303 Prediabetes: Secondary | ICD-10-CM | POA: Diagnosis not present

## 2020-11-05 MED ORDER — BUPROPION HCL ER (SR) 150 MG PO TB12: 150.0000 mg | ORAL_TABLET | Freq: Two times a day (BID) | ORAL | 0 refills | Status: DC

## 2020-11-05 NOTE — Progress Notes (Signed)
Chief Complaint:   OBESITY [name] is here to discuss her progress with her obesity treatment plan along with follow-up of her obesity related diagnoses. [name] is on the Category 3 Plan and states she is following her eating plan approximately 50% of the time. [name] states she is not exercising regularly at this time.  Today's visit was #: 52 Starting weight: 233 lbs Starting date: 02/06/2018 Today's weight: 215 lbs Today's date: 11/05/2020 Total lbs lost to date: 18 lbs Total lbs lost since last in-office visit: 0  Interim History: [name] says that due to increased depression symptoms, her appetite has decreased. She is followed by a therapist every 2-3 weeks.  She denies SI/HI. She has been slowly increasing regular exercise.  Subjective:   1. Pre-diabetes She is not on any BG lowering medications currently. Previously on metformin-unable to tolerate due to nausea. Family history of T2D - father and her brother.  2. Vitamin D deficiency She is finishing her last few doses of ergocalciferol and plans to convert ot OTC after completion.  3. Other depression, with emotional eating BP/HR excellent at office visit. Due to increased depression symptoms (ie: increased fatigue, wants to be left alone) she reports decreased appetite.  She denies SI/HI.   She is on Bupropion SR 150mg  BID and Fluoxetine 10mg  QD. She is followed by a therapist every 2-3 weeks.  4. At risk for diabetes mellitus [name] is at higher than average risk for developing diabetes due to prediabetes, family history of T2D, and obesity.    Assessment/Plan:   1. Pre-diabetes Check labs today.  - Comprehensive metabolic panel - Hemoglobin A1c - Insulin, random  2. Vitamin D deficiency Check vitamin D level today. MyChart patient with results tomorrow.  - VITAMIN D 25 Hydroxy (Vit-D Deficiency, Fractures)  3. Other depression, with emotional eating Refill bupropion SR 150 mg BID, as per  below.  - Refill buPROPion (WELLBUTRIN SR) 150 MG 12 hr tablet; Take 1 tablet (150 mg total) by mouth 2 (two) times daily.  Dispense: 60 tablet; Refill: 0  4. At risk for diabetes mellitus [name] was given approximately 15 minutes of diabetes education and counseling today. We discussed intensive lifestyle modifications today with an emphasis on weight loss as well as increasing exercise and decreasing simple carbohydrates in her diet. We also reviewed medication options with an emphasis on risk versus benefit of those discussed.   Repetitive spaced learning was employed today to elicit superior memory formation and behavioral change.   5. Obesity, current BMI 35.9  [name] is currently in the action stage of change. As such, her goal is to continue with weight loss efforts. She has agreed to the Category 3 Plan.   Exercise goals:  Walk daily.  HIIT 3 times per week.  Behavioral modification strategies: increasing lean protein intake, decreasing simple carbohydrates, meal planning and cooking strategies, keeping healthy foods in the home, and planning for success.  [name] has agreed to follow-up with our clinic in 3 weeks. She was informed of the importance of frequent follow-up visits to maximize her success with intensive lifestyle modifications for her multiple health conditions.   Objective:   Blood pressure 107/71, pulse 67, temperature 98.5 F (36.9 C), height 5\' 5"  (1.651 m), weight 215 lb (97.5 kg), SpO2 96 %, unknown if currently breastfeeding. Body mass index is 35.78 kg/m.  General: Cooperative, alert, well developed, in no acute distress. HEENT: Conjunctivae and lids unremarkable. Cardiovascular: Regular rhythm.  Lungs: Normal work of  breathing. Neurologic: No focal deficits.   Lab Results  Component Value Date   CREATININE 0.78 07/15/2020   BUN 12 07/15/2020   NA 138 07/15/2020   K 4.3 07/15/2020   CL 103 07/15/2020   CO2 21 07/15/2020   Lab Results  Component  Value Date   ALT 20 07/15/2020   AST 17 07/15/2020   ALKPHOS 57 07/15/2020   BILITOT 0.4 07/15/2020   Lab Results  Component Value Date   HGBA1C 5.6 07/15/2020   HGBA1C 5.8 (H) 03/18/2020   HGBA1C 5.7 (H) 10/03/2019   HGBA1C 5.7 (H) 05/15/2019   HGBA1C 5.8 (H) 01/29/2019   Lab Results  Component Value Date   INSULIN 18.4 07/15/2020   INSULIN 29.1 (H) 10/03/2019   INSULIN 19.3 05/15/2019   INSULIN 20.0 01/29/2019   INSULIN 33.0 (H) 08/30/2018   Lab Results  Component Value Date   TSH 1.97 03/18/2020   Lab Results  Component Value Date   CHOL 140 07/15/2020   HDL 54 07/15/2020   LDLCALC 72 07/15/2020   TRIG 67 07/15/2020   CHOLHDL 2.6 07/15/2020   Lab Results  Component Value Date   VD25OH 71.0 07/15/2020   VD25OH 34 03/18/2020   VD25OH 56.1 10/03/2019   Lab Results  Component Value Date   WBC 5.0 05/16/2020   HGB 12.3 05/16/2020   HCT 37.7 05/16/2020   MCV 87.3 05/16/2020   PLT 406 (H) 05/16/2020   Lab Results  Component Value Date   IRON 54 09/30/2017   TIBC 373 09/30/2017   FERRITIN 81 09/30/2017   Attestation Statements:   Reviewed by clinician on day of visit: allergies, medications, problem list, medical history, surgical history, family history, social history, and previous encounter notes.  I, Water quality scientist, CMA, am acting as Location manager for Mina Marble, NP.  I have reviewed the above documentation for accuracy and completeness, and I agree with the above. -  Demerius Podolak d. Shaneka Efaw, NP-C

## 2020-11-06 ENCOUNTER — Encounter (INDEPENDENT_AMBULATORY_CARE_PROVIDER_SITE_OTHER): Payer: Self-pay | Admitting: Adult Health

## 2020-11-07 LAB — HEMOGLOBIN A1C
Est. average glucose Bld gHb Est-mCnc: 117 mg/dL
Hgb A1c MFr Bld: 5.7 % — ABNORMAL HIGH (ref 4.8–5.6)

## 2020-11-07 LAB — COMPREHENSIVE METABOLIC PANEL
ALT: 11 IU/L (ref 0–32)
AST: 11 IU/L (ref 0–40)
Albumin/Globulin Ratio: 1.7 (ref 1.2–2.2)
Albumin: 4.6 g/dL (ref 3.8–4.8)
Alkaline Phosphatase: 53 IU/L (ref 44–121)
BUN/Creatinine Ratio: 13 (ref 9–23)
BUN: 10 mg/dL (ref 6–24)
Bilirubin Total: 0.4 mg/dL (ref 0.0–1.2)
CO2: 19 mmol/L — ABNORMAL LOW (ref 20–29)
Calcium: 10 mg/dL (ref 8.7–10.2)
Chloride: 104 mmol/L (ref 96–106)
Creatinine, Ser: 0.79 mg/dL (ref 0.57–1.00)
Globulin, Total: 2.7 g/dL (ref 1.5–4.5)
Glucose: 96 mg/dL (ref 70–99)
Potassium: 4.8 mmol/L (ref 3.5–5.2)
Sodium: 141 mmol/L (ref 134–144)
Total Protein: 7.3 g/dL (ref 6.0–8.5)
eGFR: 97 mL/min/{1.73_m2} (ref >59)

## 2020-11-07 LAB — VITAMIN D 25 HYDROXY (VIT D DEFICIENCY, FRACTURES): Vit D, 25-Hydroxy: 61.5 ng/mL (ref 30.0–100.0)

## 2020-11-07 LAB — INSULIN, RANDOM: INSULIN: 20.4 u[IU]/mL (ref 2.6–24.9)

## 2020-11-19 ENCOUNTER — Ambulatory Visit (INDEPENDENT_AMBULATORY_CARE_PROVIDER_SITE_OTHER): Payer: BC Managed Care – PPO | Admitting: Professional

## 2020-11-19 DIAGNOSIS — F411 Generalized anxiety disorder: Secondary | ICD-10-CM | POA: Diagnosis not present

## 2020-11-19 DIAGNOSIS — F331 Major depressive disorder, recurrent, moderate: Secondary | ICD-10-CM | POA: Diagnosis not present

## 2020-12-03 ENCOUNTER — Other Ambulatory Visit: Payer: Self-pay

## 2020-12-03 ENCOUNTER — Ambulatory Visit (INDEPENDENT_AMBULATORY_CARE_PROVIDER_SITE_OTHER): Payer: BC Managed Care – PPO | Admitting: Adult Health

## 2020-12-03 ENCOUNTER — Encounter (INDEPENDENT_AMBULATORY_CARE_PROVIDER_SITE_OTHER): Payer: Self-pay | Admitting: Adult Health

## 2020-12-03 VITALS — BP 113/75 | HR 84 | Temp 98.3°F | Ht 65.0 in | Wt 220.0 lb

## 2020-12-03 DIAGNOSIS — Z6838 Body mass index (BMI) 38.0-38.9, adult: Secondary | ICD-10-CM

## 2020-12-03 DIAGNOSIS — Z9189 Other specified personal risk factors, not elsewhere classified: Secondary | ICD-10-CM | POA: Diagnosis not present

## 2020-12-03 DIAGNOSIS — E559 Vitamin D deficiency, unspecified: Secondary | ICD-10-CM

## 2020-12-03 DIAGNOSIS — R7303 Prediabetes: Secondary | ICD-10-CM | POA: Diagnosis not present

## 2020-12-03 DIAGNOSIS — F3289 Other specified depressive episodes: Secondary | ICD-10-CM | POA: Diagnosis not present

## 2020-12-03 MED ORDER — METFORMIN HCL 500 MG PO TABS: ORAL_TABLET | ORAL | 3 refills | Status: DC

## 2020-12-03 NOTE — Progress Notes (Signed)
Chief Complaint:   OBESITY [name] is here to discuss her progress with her obesity treatment plan along with follow-up of her obesity related diagnoses. [name] is on the Category 3 Plan and states she is following her eating plan approximately 10% of the time. [name] states she is not exercising regularly at this time.  Today's visit was #: 65 Starting weight: 233 lbs Starting date: 02/06/2018 Today's weight: 220 lbs Today's date: 12/03/2020 Total lbs lost to date: 13 lbs Total lbs lost since last in-office visit: 0  Interim History:  [name] says she has been under increased stress due to [employer] and her 67-year-old son recently acutely ill with RSV. She wakes without an appetites and consumes her first meal 1300 or later.  Drinking little water.  Dinner is usually take-out.  Of note:  She has two children- 12 (g), 5 (b)  Subjective:   1. Pre-diabetes Worsening.  Discussed labs with patient today.  On 11/05/2020, BG 96, A1c 5.7, insulin 20.4. 11/05/20 CMP GFR 97 Her father and brother have T2D.  2. Vitamin D deficiency Discussed labs with patient today.  Vitamin D level on 11/05/2020 - 61.5 - at goal. She is on OTC vitamin D3 5,000 IU daily.  3. Other depression, with emotional eating She is on bupropion SR 150 mg BID and fluoxetine 10 mg daily. She report stable mood and therapy every 3 weeks.  4. At risk for diarrhea [name] is at higher risk of diarrhea due to starting metformin, worsening A1c.  Assessment/Plan:   1. Pre-diabetes Start on metformin 500 mg 1/2 tablet with breakfast for 1 week, then increase to 1 full tablet.  - Start metFORMIN (GLUCOPHAGE) 500 MG tablet; 1/2 tab with breakfast one week, then increase to 1 full tab second week- hold at this dose  Dispense: 30 tablet; Refill: 3  2. Vitamin D deficiency Continue OTC vitamin D2 5,000 IU daily.  3. Other depression, with emotional eating Continue current medicaiton regimen and  therapy.  4. At risk for diarrhea [name] was given approximately 15 minutes of diarrhea prevention counseling today. She is 40 y.o. female and has risk factors for diarrhea including medications and changes in diet. We discussed intensive lifestyle modifications today with an emphasis on specific weight loss instructions including dietary strategies.   Repetitive spaced learning was employed today to elicit superior memory formation and behavioral change.   5. Obesity, current BMI 36.6  [name] is currently in the action stage of change. As such, her goal is to continue with weight loss efforts. She has agreed to the Category 3 Plan.   Exercise goals:  As is.  Behavioral modification strategies: increasing lean protein intake, decreasing simple carbohydrates, meal planning and cooking strategies, keeping healthy foods in the home, and keeping a strict food journal.  [name] has agreed to follow-up with our clinic in 4 weeks. She was informed of the importance of frequent follow-up visits to maximize her success with intensive lifestyle modifications for her multiple health conditions.   Objective:   Blood pressure 113/75, pulse 84, temperature 98.3 F (36.8 C), height 5\' 5"  (1.651 m), weight 220 lb (99.8 kg), SpO2 99 %, unknown if currently breastfeeding. Body mass index is 36.61 kg/m.  General: Cooperative, alert, well developed, in no acute distress. HEENT: Conjunctivae and lids unremarkable. Cardiovascular: Regular rhythm.  Lungs: Normal work of breathing. Neurologic: No focal deficits.   Lab Results  Component Value Date   CREATININE 0.79 11/05/2020   BUN 10  11/05/2020   NA 141 11/05/2020   K 4.8 11/05/2020   CL 104 11/05/2020   CO2 19 (L) 11/05/2020   Lab Results  Component Value Date   ALT 11 11/05/2020   AST 11 11/05/2020   ALKPHOS 53 11/05/2020   BILITOT 0.4 11/05/2020   Lab Results  Component Value Date   HGBA1C 5.7 (H) 11/05/2020   HGBA1C 5.6 07/15/2020    HGBA1C 5.8 (H) 03/18/2020   HGBA1C 5.7 (H) 10/03/2019   HGBA1C 5.7 (H) 05/15/2019   Lab Results  Component Value Date   INSULIN 20.4 11/05/2020   INSULIN 18.4 07/15/2020   INSULIN 29.1 (H) 10/03/2019   INSULIN 19.3 05/15/2019   INSULIN 20.0 01/29/2019   Lab Results  Component Value Date   TSH 1.97 03/18/2020   Lab Results  Component Value Date   CHOL 140 07/15/2020   HDL 54 07/15/2020   LDLCALC 72 07/15/2020   TRIG 67 07/15/2020   CHOLHDL 2.6 07/15/2020   Lab Results  Component Value Date   VD25OH 61.5 11/05/2020   VD25OH 71.0 07/15/2020   VD25OH 34 03/18/2020   Lab Results  Component Value Date   WBC 5.0 05/16/2020   HGB 12.3 05/16/2020   HCT 37.7 05/16/2020   MCV 87.3 05/16/2020   PLT 406 (H) 05/16/2020   Lab Results  Component Value Date   IRON 54 09/30/2017   TIBC 373 09/30/2017   FERRITIN 81 09/30/2017   Attestation Statements:   Reviewed by clinician on day of visit: allergies, medications, problem list, medical history, surgical history, family history, social history, and previous encounter notes.  I, Water quality scientist, CMA, am acting as Location manager for Mina Marble, NP.  I have reviewed the above documentation for accuracy and completeness, and I agree with the above. -  Yesenia Locurto d. Dyke Weible, NP-C

## 2020-12-10 ENCOUNTER — Ambulatory Visit (INDEPENDENT_AMBULATORY_CARE_PROVIDER_SITE_OTHER): Payer: BC Managed Care – PPO | Admitting: Professional

## 2020-12-10 DIAGNOSIS — F411 Generalized anxiety disorder: Secondary | ICD-10-CM

## 2020-12-10 DIAGNOSIS — F331 Major depressive disorder, recurrent, moderate: Secondary | ICD-10-CM

## 2020-12-22 ENCOUNTER — Other Ambulatory Visit: Payer: Self-pay | Admitting: Obstetrics and Gynecology

## 2020-12-22 DIAGNOSIS — Z1231 Encounter for screening mammogram for malignant neoplasm of breast: Secondary | ICD-10-CM

## 2020-12-26 ENCOUNTER — Ambulatory Visit: Payer: BC Managed Care – PPO | Attending: Family

## 2020-12-26 DIAGNOSIS — Z23 Encounter for immunization: Secondary | ICD-10-CM

## 2020-12-26 NOTE — Progress Notes (Signed)
   Covid-19 Vaccination Clinic  Name:  [name]    MRN: [id number] DOB: 1980-08-11  12/26/2020  [name] was observed post Covid-19 immunization for 15 minutes without incident. She was provided with Vaccine Information Sheet and instruction to access the V-Safe system.   [name] was instructed to call 911 with any severe reactions post vaccine: Difficulty breathing  Swelling of face and throat  A fast heartbeat  A bad rash all over body  Dizziness and weakness   Immunizations Administered     Name Date Dose VIS Date Route   Moderna Covid-19 vaccine Bivalent Booster 12/26/2020  3:30 PM 0.5 mL 08/30/2020 Intramuscular   Manufacturer: Levan Hurst   Lot: [id number]   [place]: [id number]

## 2020-12-30 ENCOUNTER — Other Ambulatory Visit: Payer: Self-pay

## 2020-12-30 ENCOUNTER — Encounter (INDEPENDENT_AMBULATORY_CARE_PROVIDER_SITE_OTHER): Payer: Self-pay | Admitting: Adult Health

## 2020-12-30 ENCOUNTER — Ambulatory Visit (INDEPENDENT_AMBULATORY_CARE_PROVIDER_SITE_OTHER): Payer: BC Managed Care – PPO | Admitting: Adult Health

## 2020-12-30 VITALS — BP 123/75 | HR 86 | Temp 98.4°F | Ht 65.0 in | Wt 215.0 lb

## 2020-12-30 DIAGNOSIS — Z9189 Other specified personal risk factors, not elsewhere classified: Secondary | ICD-10-CM | POA: Diagnosis not present

## 2020-12-30 DIAGNOSIS — R7303 Prediabetes: Secondary | ICD-10-CM | POA: Diagnosis not present

## 2020-12-30 DIAGNOSIS — Z6838 Body mass index (BMI) 38.0-38.9, adult: Secondary | ICD-10-CM | POA: Diagnosis not present

## 2020-12-30 MED ORDER — METFORMIN HCL 500 MG PO TABS: ORAL_TABLET | ORAL | 0 refills | Status: DC

## 2020-12-30 NOTE — Progress Notes (Signed)
Chief Complaint:   OBESITY [name] is here to discuss her progress with her obesity treatment plan along with follow-up of her obesity related diagnoses. [name] is on the Category 3 Plan and states she is following her eating plan approximately 20% of the time. [name] states she is not exercising regularly.  Today's visit was #: 102 Starting weight: 233 lbs Starting date: 02/06/2018 Today's weight: 215 lbs Today's date: 12/30/2020 Total lbs lost to date: 18 lbs Total lbs lost since last in-office visit: 5 lbs  Interim History:  [name] was started on metformin at last office visit.  She has remained on 500 mg 1/2 tablet due to missing breakfast at times. She denies GI upset, reports less cravings on current dosage of biguanide.   Subjective:   1. Pre-diabetes [name] was started on metformin at last office visit.  She has remained on 500 mg 1/2 tablet due to missing breakfast at times. She denies GI upset, reports less cravings on current dosage of bigunaide.   2. At risk for diarrhea [name] is at risk for diarrhea due to increasing metformin for prediabetes.  Assessment/Plan:   1. Pre-diabetes Increase to full tablet of metformin 500mg  at lunch daily.  - Refill metFORMIN (GLUCOPHAGE) 500 MG tablet; 1 tab with lunch  Dispense: 30 tablet; Refill: 0  2. At risk for diarrhea [name] was given approximately 15 minutes of diarrhea prevention counseling today. She is 40 y.o. female and has risk factors for diarrhea including medications and changes in diet. We discussed intensive lifestyle modifications today with an emphasis on specific weight loss instructions including dietary strategies.   Repetitive spaced learning was employed today to elicit superior memory formation and behavioral change.  3. Obesity, current BMI 35.8  [name] is currently in the action stage of change. As such, her goal is to continue with weight loss efforts. She has agreed to the Category 3 Plan.    Discussed resuming regular regimen.  Exercise goals:  NEAT.  Behavioral modification strategies: increasing lean protein intake, decreasing simple carbohydrates, increasing water intake, meal planning and cooking strategies, keeping healthy foods in the home, and planning for success.  [name] has agreed to follow-up with our clinic in 4 weeks. She was informed of the importance of frequent follow-up visits to maximize her success with intensive lifestyle modifications for her multiple health conditions.   Objective:   Blood pressure 123/75, pulse 86, temperature 98.4 F (36.9 C), height 5\' 5"  (1.651 m), weight 215 lb (97.5 kg), SpO2 100 %, unknown if currently breastfeeding. Body mass index is 35.78 kg/m.  General: Cooperative, alert, well developed, in no acute distress. HEENT: Conjunctivae and lids unremarkable. Cardiovascular: Regular rhythm.  Lungs: Normal work of breathing. Neurologic: No focal deficits.   Lab Results  Component Value Date   CREATININE 0.79 11/05/2020   BUN 10 11/05/2020   NA 141 11/05/2020   K 4.8 11/05/2020   CL 104 11/05/2020   CO2 19 (L) 11/05/2020   Lab Results  Component Value Date   ALT 11 11/05/2020   AST 11 11/05/2020   ALKPHOS 53 11/05/2020   BILITOT 0.4 11/05/2020   Lab Results  Component Value Date   HGBA1C 5.7 (H) 11/05/2020   HGBA1C 5.6 07/15/2020   HGBA1C 5.8 (H) 03/18/2020   HGBA1C 5.7 (H) 10/03/2019   HGBA1C 5.7 (H) 05/15/2019   Lab Results  Component Value Date   INSULIN 20.4 11/05/2020   INSULIN 18.4 07/15/2020   INSULIN 29.1 (H) 10/03/2019  INSULIN 19.3 05/15/2019   INSULIN 20.0 01/29/2019   Lab Results  Component Value Date   TSH 1.97 03/18/2020   Lab Results  Component Value Date   CHOL 140 07/15/2020   HDL 54 07/15/2020   LDLCALC 72 07/15/2020   TRIG 67 07/15/2020   CHOLHDL 2.6 07/15/2020   Lab Results  Component Value Date   VD25OH 61.5 11/05/2020   VD25OH 71.0 07/15/2020   VD25OH 34 03/18/2020    Lab Results  Component Value Date   WBC 5.0 05/16/2020   HGB 12.3 05/16/2020   HCT 37.7 05/16/2020   MCV 87.3 05/16/2020   PLT 406 (H) 05/16/2020   Lab Results  Component Value Date   IRON 54 09/30/2017   TIBC 373 09/30/2017   FERRITIN 81 09/30/2017   Attestation Statements:   Reviewed by clinician on day of visit: allergies, medications, problem list, medical history, surgical history, family history, social history, and previous encounter notes.  I, Water quality scientist, CMA, am acting as Location manager for Mina Marble, NP.  I have reviewed the above documentation for accuracy and completeness, and I agree with the above. -  Seini Lannom d. Makana Rostad, NP-C

## 2020-12-31 ENCOUNTER — Encounter: Payer: Self-pay | Admitting: Professional

## 2020-12-31 ENCOUNTER — Encounter: Payer: Self-pay | Admitting: Internal Medicine

## 2020-12-31 ENCOUNTER — Ambulatory Visit (INDEPENDENT_AMBULATORY_CARE_PROVIDER_SITE_OTHER): Payer: BC Managed Care – PPO | Admitting: Professional

## 2020-12-31 DIAGNOSIS — F331 Major depressive disorder, recurrent, moderate: Secondary | ICD-10-CM

## 2020-12-31 DIAGNOSIS — F411 Generalized anxiety disorder: Secondary | ICD-10-CM

## 2020-12-31 NOTE — Progress Notes (Signed)
[name] Counselor/Therapist Progress Note  Patient ID: [name], MRN: [id number],    Date: 12/31/2020  Time Spent: 51 minutes [id number] am  Treatment Type: Individual Therapy  Reported Symptoms: depressed and tearful  Mental Status Exam: Appearance:  Neat     Behavior: Sharing  Motor: Normal  Speech/Language:  Clear and Coherent and Normal Rate  Affect: Full Range and Tearful  Mood: sad  Thought process: normal  Thought content:   WNL  Sensory/Perceptual disturbances:   WNL  Orientation: oriented to person, place, time/date, and situation  Attention: Good  Concentration: Good  Memory: WNL  Fund of knowledge:  Good  Insight:   Good  Judgment:  Good  Impulse Control: Good   Risk Assessment: Danger to Self:  No Self-injurious Behavior: No Danger to Others: No Duty to Warn:no Physical Aggression / Violence:No  Access to Firearms a concern: No  Gang Involvement:No   Subjective:  This session was held via video teletherapy due to the coronavirus risk at this time. The patient consented to video teletherapy and was located at her home during this session. She is aware it is the responsibility of the patient to secure confidentiality on her end of the session. The provider was in a private home office for the duration of this session.   The patient arrived on time for her webex appointment appearing nicely groomed and easily engaged  Issues addressed: 1-marital -pt is going to make marital therapy appt since [place] agreed yesterday -pt comments that she thinks that they need to separate   -discussed potential ways in which that could occur -pt wants him to wake up and thinks this would help him appreciate her -pt sees no difference since she had told him what was wrong   -she does not see any investment on his part   -she states "it feels hurtful" -pt stated they are just roommates -pt admits part of her wants her marriage to work   [employer] it  going to do if I do all this work" -pt shared example of his lack of participation in the family   -does nothing without being told -pt tearful and reports she feels the marriage is already over   -she does not know how to say it or help him understand just how bad things are for her -encouraged pt to be transparent in marital therapy -pt admits in the past she would have been the "people pleaser" having the nice cars and house   -she has realized how much Liliane Channel appears concerned about what things look like than what they really are   -she thinks if he really was concerned that he would be working on "[place]" -pt has been wondering how he would react if she were to say they need to separate -she has told him that she does not trust him to work on their marriage -she plans to tell him that he needs to be open to this, participate and hear and that she will do the same -pt has admitted to him that she was feeling resentful toward him -he will get an attitude and withdrawal and get quiet and just pass each other -pt does not recall when a time was that she was happy, content, and fulfilled   -she is unsure because they have so much   -the sex thing has been an issue since they had Lauren     -it is an issue for her that they will go for extended periods  of time, up to two years       -Liliane Channel has never been interested in sex       -she thinks it has impacted her self-esteem       -pt thinks that is the base of all their issues   -she does not feel she can be herself around him -pt feels unsupported and alone -she wants to meet new people and he goes to work and comes home; Liliane Channel is a Warehouse manager -pt questions if he likes her being her authentic self 2-relationship with mom -baking cookies the other day her mom commented that she was so frustrated with pt's dad -pt commented to her that she doesn't want to be in the same position at 27 as she is at 73 -her mom commented that the pt has been saying  "marriage is hard" a lot lately     Interventions: Cognitive Behavioral Therapy, Assertiveness/Communication, Motivational Interviewing, and Narrative  Diagnosis:Major depressive disorder, recurrent episode, moderate ([place])  Generalized anxiety disorder  Plan:  -meet again   Francie Massing, [facility]

## 2021-01-01 NOTE — Telephone Encounter (Signed)
scheduled

## 2021-01-06 ENCOUNTER — Other Ambulatory Visit: Payer: Self-pay

## 2021-01-06 ENCOUNTER — Encounter: Payer: Self-pay | Admitting: Internal Medicine

## 2021-01-06 ENCOUNTER — Ambulatory Visit (INDEPENDENT_AMBULATORY_CARE_PROVIDER_SITE_OTHER): Payer: BC Managed Care – PPO | Admitting: Internal Medicine

## 2021-01-06 VITALS — BP 118/72 | HR 83 | Temp 97.8°F | Ht 65.0 in | Wt 220.0 lb

## 2021-01-06 DIAGNOSIS — F320 Major depressive disorder, single episode, mild: Secondary | ICD-10-CM

## 2021-01-06 MED ORDER — FLUOXETINE HCL 20 MG PO TABS: 20.0000 mg | ORAL_TABLET | Freq: Every day | ORAL | 1 refills | Status: DC

## 2021-01-06 MED ORDER — BUPROPION HCL ER (XL) 300 MG PO TB24: 300.0000 mg | ORAL_TABLET | Freq: Every day | ORAL | 2 refills | Status: DC

## 2021-01-06 NOTE — Progress Notes (Signed)
° °  Subjective:    Patient ID: [name], female    DOB: [birth year], 40 y.o.   MRN: [id number]  HPI 40 year old Female in today to discuss depression and treatment of depression.  She continues to be seen at [facility].  Has been on Prozac 10 mg daily but feels that it is not working.  Is also taking Wellbutrin SR 150 mg previously.  Some situational stress at home discussed.  She will be seeing a counselor in the near future. She would like to have medication adjustment.  Explained to her what these various antidepressants do in terms of raising serotonin as in taking Prozac and raising dopamine as an taking Wellbutrin.  She is agreeable to trying this.   Review of Systems some stress at work discussed     Objective:   Physical Exam Her affect is slightly sad.  Her energy level appears to be low.  Her thought process is normal.       Assessment & Plan:  Depression  Situational stress  Plan: She will try Wellbutrin XL 300 mg daily and Prozac 20 mg daily.  Needs to try these for 4 to 6 weeks and see if symptoms improved.  Counseling planned for the first of the year she says.

## 2021-01-17 NOTE — Patient Instructions (Signed)
Take Prozac 20 mg daily and Wellbutrin XL 300 mg daily.  Agree with counseling.  Please let me know if symptoms not improving in 6 weeks

## 2021-01-22 ENCOUNTER — Ambulatory Visit: Payer: BC Managed Care – PPO

## 2021-01-26 ENCOUNTER — Ambulatory Visit
Admission: RE | Admit: 2021-01-26 | Discharge: 2021-01-26 | Disposition: A | Discharge status: Home / Self Care | Payer: BC Managed Care – PPO | Source: Ambulatory Visit | Attending: Obstetrics and Gynecology | Admitting: Obstetrics and Gynecology

## 2021-01-26 ENCOUNTER — Ambulatory Visit: Payer: BC Managed Care – PPO | Admitting: Professional

## 2021-01-26 DIAGNOSIS — Z1231 Encounter for screening mammogram for malignant neoplasm of breast: Secondary | ICD-10-CM

## 2021-01-28 ENCOUNTER — Ambulatory Visit (INDEPENDENT_AMBULATORY_CARE_PROVIDER_SITE_OTHER): Payer: BC Managed Care – PPO | Admitting: Adult Health

## 2021-01-28 ENCOUNTER — Other Ambulatory Visit: Payer: Self-pay

## 2021-01-28 ENCOUNTER — Encounter (INDEPENDENT_AMBULATORY_CARE_PROVIDER_SITE_OTHER): Payer: Self-pay | Admitting: Adult Health

## 2021-01-28 VITALS — BP 115/72 | HR 98 | Temp 98.2°F | Ht 65.0 in | Wt 213.0 lb

## 2021-01-28 DIAGNOSIS — Z6835 Body mass index (BMI) 35.0-35.9, adult: Secondary | ICD-10-CM | POA: Diagnosis not present

## 2021-01-28 DIAGNOSIS — F320 Major depressive disorder, single episode, mild: Secondary | ICD-10-CM | POA: Diagnosis not present

## 2021-01-28 DIAGNOSIS — R7303 Prediabetes: Secondary | ICD-10-CM

## 2021-01-28 DIAGNOSIS — Z9189 Other specified personal risk factors, not elsewhere classified: Secondary | ICD-10-CM | POA: Diagnosis not present

## 2021-01-28 MED ORDER — METFORMIN HCL 500 MG PO TABS: ORAL_TABLET | ORAL | 0 refills | Status: DC

## 2021-01-28 NOTE — Progress Notes (Signed)
Chief Complaint:   OBESITY [name] is here to discuss her progress with her obesity treatment plan along with follow-up of her obesity related diagnoses. [name] is on the Category 3 Plan and states she is following her eating plan approximately 10% of the time. [name] states she is not exercising regularly at this time.  Today's visit was #: 13 Starting weight: 233 lbs Starting date: 02/06/2018 Today's weight: 213 lbs Today's date: 01/28/2021 Total lbs lost to date: 20 lbs Total lbs lost since last in-office visit: 2 lbs  Interim History:  Low plan compliance over the holidays - 10%. Now into the New Year- ready re-focus and increase plan compliance and increase regular exercise.  2023 goal - Lose another 14-15 pounds, which would bring her under the 200 pounds mark.  Subjective:   1. Pre-diabetes On 11/05/2020, CMP - GFR 97, A1c 5.7. She has increased metformin to full 500 mg tablet. Tolerating well.  2. Depression, major, single episode, mild ([facility]) Previously on bupropion SR 150 mg daily, fluoxetine 10 mg daily. Seen by PCP on 01/06/2021 - bupropion changed from SR to XL, dosage increased to 300 mg, and fluoxetine increased from 10 mg to 20 mg.  3. At risk for diabetes mellitus [name] is at higher than average risk for developing diabetes due to prediabetes and obesity.   Assessment/Plan:   1. Pre-diabetes Check labs at next office visit. Refill metformin 500 mg at lunch.  - Refill metFORMIN (GLUCOPHAGE) 500 MG tablet; 1 tab with lunch  Dispense: 30 tablet; Refill: 0  2. Depression, major, single episode, mild ([place]) Follow-up with PCP as directed.  Request refills from PCP.  3. At risk for diabetes mellitus [name] was given approximately 15 minutes of diabetes education and counseling today. We discussed intensive lifestyle modifications today with an emphasis on weight loss as well as increasing exercise and decreasing simple carbohydrates in her diet. We also  reviewed medication options with an emphasis on risk versus benefit of those discussed.   Repetitive spaced learning was employed today to elicit superior memory formation and behavioral change.  4. Obesity, current BMI 35.5  [name] is currently in the action stage of change. As such, her goal is to continue with weight loss efforts. She has agreed to the Category 3 Plan.   Check fasting labs at next office visit.  Exercise goals:  Increase daily walking.  Behavioral modification strategies: increasing lean protein intake, decreasing simple carbohydrates, meal planning and cooking strategies, keeping healthy foods in the home, and planning for success.  [name] has agreed to follow-up with our clinic in 3 weeks. She was informed of the importance of frequent follow-up visits to maximize her success with intensive lifestyle modifications for her multiple health conditions.   Objective:   Blood pressure 115/72, pulse 98, temperature 98.2 F (36.8 C), height 5\' 5"  (1.651 m), weight 213 lb (96.6 kg), last menstrual period 01/05/2021, SpO2 98 %, unknown if currently breastfeeding. Body mass index is 35.45 kg/m.  General: Cooperative, alert, well developed, in no acute distress. HEENT: Conjunctivae and lids unremarkable. Cardiovascular: Regular rhythm.  Lungs: Normal work of breathing. Neurologic: No focal deficits.   Lab Results  Component Value Date   CREATININE 0.79 11/05/2020   BUN 10 11/05/2020   NA 141 11/05/2020   K 4.8 11/05/2020   CL 104 11/05/2020   CO2 19 (L) 11/05/2020   Lab Results  Component Value Date   ALT 11 11/05/2020   AST 11 11/05/2020  ALKPHOS 53 11/05/2020   BILITOT 0.4 11/05/2020   Lab Results  Component Value Date   HGBA1C 5.7 (H) 11/05/2020   HGBA1C 5.6 07/15/2020   HGBA1C 5.8 (H) 03/18/2020   HGBA1C 5.7 (H) 10/03/2019   HGBA1C 5.7 (H) 05/15/2019   Lab Results  Component Value Date   INSULIN 20.4 11/05/2020   INSULIN 18.4 07/15/2020    INSULIN 29.1 (H) 10/03/2019   INSULIN 19.3 05/15/2019   INSULIN 20.0 01/29/2019   Lab Results  Component Value Date   TSH 1.97 03/18/2020   Lab Results  Component Value Date   CHOL 140 07/15/2020   HDL 54 07/15/2020   LDLCALC 72 07/15/2020   TRIG 67 07/15/2020   CHOLHDL 2.6 07/15/2020   Lab Results  Component Value Date   VD25OH 61.5 11/05/2020   VD25OH 71.0 07/15/2020   VD25OH 34 03/18/2020   Lab Results  Component Value Date   WBC 5.0 05/16/2020   HGB 12.3 05/16/2020   HCT 37.7 05/16/2020   MCV 87.3 05/16/2020   PLT 406 (H) 05/16/2020   Lab Results  Component Value Date   IRON 54 09/30/2017   TIBC 373 09/30/2017   FERRITIN 81 09/30/2017   Attestation Statements:   Reviewed by clinician on day of visit: allergies, medications, problem list, medical history, surgical history, family history, social history, and previous encounter notes.  I, Water quality scientist, CMA, am acting as Location manager for Mina Marble, NP.  I have reviewed the above documentation for accuracy and completeness, and I agree with the above. -  Moosa Bueche d. Rosabel Sermeno, NP-C

## 2021-01-31 ENCOUNTER — Other Ambulatory Visit (INDEPENDENT_AMBULATORY_CARE_PROVIDER_SITE_OTHER): Payer: Self-pay | Admitting: Adult Health

## 2021-01-31 DIAGNOSIS — R7303 Prediabetes: Secondary | ICD-10-CM

## 2021-02-09 ENCOUNTER — Ambulatory Visit (INDEPENDENT_AMBULATORY_CARE_PROVIDER_SITE_OTHER): Payer: BC Managed Care – PPO | Admitting: Internal Medicine

## 2021-02-09 ENCOUNTER — Encounter: Payer: Self-pay | Admitting: Internal Medicine

## 2021-02-09 ENCOUNTER — Other Ambulatory Visit: Payer: Self-pay

## 2021-02-09 VITALS — BP 108/80 | HR 104 | Temp 98.6°F | Ht 65.0 in | Wt 217.5 lb

## 2021-02-09 DIAGNOSIS — F439 Reaction to severe stress, unspecified: Secondary | ICD-10-CM | POA: Diagnosis not present

## 2021-02-09 DIAGNOSIS — Z8659 Personal history of other mental and behavioral disorders: Secondary | ICD-10-CM | POA: Diagnosis not present

## 2021-02-09 MED ORDER — FLUOXETINE HCL 20 MG PO TABS: 20.0000 mg | ORAL_TABLET | Freq: Every day | ORAL | 3 refills | Status: DC

## 2021-02-09 MED ORDER — BUPROPION HCL ER (XL) 300 MG PO TB24: 300.0000 mg | ORAL_TABLET | Freq: Every day | ORAL | 3 refills | Status: DC

## 2021-02-09 NOTE — Progress Notes (Signed)
° °  Subjective:    Patient ID: [name], female    DOB: 10/02/1980, 41 y.o.   MRN: [id number]  HPI [name] was seen here on December 20 regarding treatment of depression.  She is a patient at [facility] healthy [facility] clinic.  Had been on low-dose Prozac 10 mg daily also was taking Wellbutrin SR 150 mg daily.  At visit on December 20th, Prozac was increased to 20 mg daily and Wellbutrin was changed to 300 mg XL daily from 150 mg.  In December, she was having some situational stress at home with her husband.  She did see a counselor recently.  She says recently relationship with her husband has improved.  She feels better with Prozac 20 mg daily.  She may continue with this indefinitely.  Explained to her that sometimes there needs to be an adjustment in dose to increase serotonin level of the brain.  She plans to interview for a new job.  Seems excited about that.    Review of Systems no other complaints at this time     Objective:   Physical Exam Blood pressure 108/80 pulse 104 temperature 98.6 degrees pulse oximetry 98% weight 217 pounds BMI 36.19  Affect appears to be normal.       Assessment & Plan:  Depression-improved on increase in Prozac and Wellbutrin dosages.  She will have physical examination this coming summer.  She will remain on Prozac 20 mg daily and Wellbutrin XL 300 mg daily.  Encouraged counseling however she feels her relationship with her husband has improved at this time.

## 2021-02-09 NOTE — Patient Instructions (Addendum)
We are glad to hear you are feeling better.  Continue current medications at current dosages.  Physical exam booked for early Summer 2023.  Labs from a Healthy Weight clinic will likely be sufficient for our purposes.  Medications refilled.

## 2021-02-21 ENCOUNTER — Encounter: Payer: Self-pay | Admitting: Internal Medicine

## 2021-02-23 ENCOUNTER — Ambulatory Visit (INDEPENDENT_AMBULATORY_CARE_PROVIDER_SITE_OTHER): Payer: BC Managed Care – PPO | Admitting: Internal Medicine

## 2021-02-23 ENCOUNTER — Other Ambulatory Visit: Payer: Self-pay

## 2021-02-23 ENCOUNTER — Encounter: Payer: Self-pay | Admitting: Internal Medicine

## 2021-02-23 VITALS — BP 122/78 | HR 111 | Temp 98.8°F | Wt 217.5 lb

## 2021-02-23 DIAGNOSIS — K645 Perianal venous thrombosis: Secondary | ICD-10-CM | POA: Diagnosis not present

## 2021-02-23 MED ORDER — HYDROCORTISONE (PERIANAL) 2.5 % EX CREA: 1.0000 "application " | TOPICAL_CREAM | Freq: Two times a day (BID) | CUTANEOUS | 3 refills | Status: DC

## 2021-02-23 NOTE — Telephone Encounter (Signed)
scheduled

## 2021-02-25 ENCOUNTER — Ambulatory Visit (INDEPENDENT_AMBULATORY_CARE_PROVIDER_SITE_OTHER): Payer: BC Managed Care – PPO | Admitting: Adult Health

## 2021-02-25 ENCOUNTER — Other Ambulatory Visit: Payer: Self-pay

## 2021-02-25 ENCOUNTER — Encounter (INDEPENDENT_AMBULATORY_CARE_PROVIDER_SITE_OTHER): Payer: Self-pay | Admitting: Adult Health

## 2021-02-25 VITALS — BP 120/74 | HR 96 | Temp 99.0°F | Ht 65.0 in | Wt 212.0 lb

## 2021-02-25 DIAGNOSIS — E669 Obesity, unspecified: Secondary | ICD-10-CM | POA: Diagnosis not present

## 2021-02-25 DIAGNOSIS — F320 Major depressive disorder, single episode, mild: Secondary | ICD-10-CM | POA: Diagnosis not present

## 2021-02-25 DIAGNOSIS — Z9189 Other specified personal risk factors, not elsewhere classified: Secondary | ICD-10-CM

## 2021-02-25 DIAGNOSIS — R7303 Prediabetes: Secondary | ICD-10-CM | POA: Diagnosis not present

## 2021-02-25 DIAGNOSIS — E559 Vitamin D deficiency, unspecified: Secondary | ICD-10-CM

## 2021-02-25 DIAGNOSIS — E66812 Obesity, class 2: Secondary | ICD-10-CM

## 2021-02-25 DIAGNOSIS — Z6835 Body mass index (BMI) 35.0-35.9, adult: Secondary | ICD-10-CM

## 2021-02-25 NOTE — Progress Notes (Signed)
Chief Complaint:   OBESITY [name] is here to discuss her progress with her obesity treatment plan along with follow-up of her obesity related diagnoses. [name] is on the Category 3 Plan and states she is following her eating plan approximately 50% of the time. [name] states she is walking/doing cardio for 45 minutes 2 times per week.  Today's visit was #: 3 Starting weight: 233 lbs Starting date: 02/06/2018 Today's weight: 212 lbs Today's date: 02/25/2021 Total lbs lost to date: 21 lbs Total lbs lost since last in-office visit: 1 lb  Interim History:  On 01/06/2021, PCP increased fluoxetine to 20 mg daily and changed bupropion from SR to XL and increased dosage to 300 mg daily.  Since last week -she has experienced constipation with worsening hemorrhoids.  She is job seeking and looking for new employment opportunities.  Subjective:   1. Pre-diabetes She is on metformin 500 mg with lunch - she reports decreased appetite. She denies GI upset.  2. Vitamin D deficiency She is currently taking prescription ergocalciferol 50,000 IU each week. She denies nausea, vomiting or muscle weakness.  3. Depression, major, single episode, mild ([name]) On 01/06/2021, PCP increased fluoxetine to 20 mg daily and changed bupropion from SR to XL and increased dosage to 300 mg daily. She reports stable mood, denies SI/HI.  4. At risk for diabetes mellitus [name] is at higher than average risk for developing diabetes due to prediabetes and obesity.   Assessment/Plan:   1. Pre-diabetes Continue metformin 500 mg daily. Check labs.  - Comprehensive metabolic panel - Hemoglobin A1c - Insulin, random - Vitamin B12  2. Vitamin D deficiency Check labs.  - VITAMIN D 25 Hydroxy (Vit-D Deficiency, Fractures)  3. Depression, major, single episode, mild ([name]) Continue Fluoxetine 20mg  QD and Bupropion XL 300mg  QD  4. At risk for diabetes mellitus [name] was given approximately 15 minutes of  diabetic education and counseling today. We discussed intensive lifestyle modifications today with an emphasis on weight loss as well as increasing exercise and decreasing simple carbohydrates in her diet. We also reviewed medication options with an emphasis on risk versus benefits of those discussed.  Repetitive spaced learning was employed today to elicit superior memory formation and behavioral change.  5. Obesity, current BMI 35.4  [name] is currently in the action stage of change. As such, her goal is to continue with weight loss efforts. She has agreed to the Category 3 Plan.   Exercise goals:  As is.  Behavioral modification strategies: increasing lean protein intake, decreasing simple carbohydrates, increasing vegetables, increasing water intake, increasing high fiber foods, meal planning and cooking strategies, keeping healthy foods in the home, and planning for success.  [name] has agreed to follow-up with our clinic in 3-4 weeks. She was informed of the importance of frequent follow-up visits to maximize her success with intensive lifestyle modifications for her multiple health conditions.   Objective:   Blood pressure 120/74, pulse 96, temperature 99 F (37.2 C), height 5\' 5"  (1.651 m), weight 212 lb (96.2 kg), SpO2 99 %, unknown if currently breastfeeding. Body mass index is 35.28 kg/m.  General: Cooperative, alert, well developed, in no acute distress. HEENT: Conjunctivae and lids unremarkable. Cardiovascular: Regular rhythm.  Lungs: Normal work of breathing. Neurologic: No focal deficits.   Lab Results  Component Value Date   CREATININE 0.79 11/05/2020   BUN 10 11/05/2020   NA 141 11/05/2020   K 4.8 11/05/2020   CL 104 11/05/2020   CO2 19 (  L) 11/05/2020   Lab Results  Component Value Date   ALT 11 11/05/2020   AST 11 11/05/2020   ALKPHOS 53 11/05/2020   BILITOT 0.4 11/05/2020   Lab Results  Component Value Date   HGBA1C 5.7 (H) 11/05/2020   HGBA1C 5.6  07/15/2020   HGBA1C 5.8 (H) 03/18/2020   HGBA1C 5.7 (H) 10/03/2019   HGBA1C 5.7 (H) 05/15/2019   Lab Results  Component Value Date   INSULIN 20.4 11/05/2020   INSULIN 18.4 07/15/2020   INSULIN 29.1 (H) 10/03/2019   INSULIN 19.3 05/15/2019   INSULIN 20.0 01/29/2019   Lab Results  Component Value Date   TSH 1.97 03/18/2020   Lab Results  Component Value Date   CHOL 140 07/15/2020   HDL 54 07/15/2020   LDLCALC 72 07/15/2020   TRIG 67 07/15/2020   CHOLHDL 2.6 07/15/2020   Lab Results  Component Value Date   VD25OH 61.5 11/05/2020   VD25OH 71.0 07/15/2020   VD25OH 34 03/18/2020   Lab Results  Component Value Date   WBC 5.0 05/16/2020   HGB 12.3 05/16/2020   HCT 37.7 05/16/2020   MCV 87.3 05/16/2020   PLT 406 (H) 05/16/2020   Lab Results  Component Value Date   IRON 54 09/30/2017   TIBC 373 09/30/2017   FERRITIN 81 09/30/2017   Attestation Statements:   Reviewed by clinician on day of visit: allergies, medications, problem list, medical history, surgical history, family history, social history, and previous encounter notes.  I, Water quality scientist, CMA, am acting as Location manager for Mina Marble, NP.  I have reviewed the above documentation for accuracy and completeness, and I agree with the above. -  Elby Blackwelder d. Alexsys Eskin, NP-C

## 2021-02-26 LAB — COMPREHENSIVE METABOLIC PANEL
ALT: 22 IU/L (ref 0–32)
AST: 22 IU/L (ref 0–40)
Albumin/Globulin Ratio: 1.6 (ref 1.2–2.2)
Albumin: 4.4 g/dL (ref 3.8–4.8)
Alkaline Phosphatase: 52 IU/L (ref 44–121)
BUN/Creatinine Ratio: 14 (ref 9–23)
BUN: 11 mg/dL (ref 6–24)
Bilirubin Total: 0.5 mg/dL (ref 0.0–1.2)
CO2: 22 mmol/L (ref 20–29)
Calcium: 9.5 mg/dL (ref 8.7–10.2)
Chloride: 103 mmol/L (ref 96–106)
Creatinine, Ser: 0.78 mg/dL (ref 0.57–1.00)
Globulin, Total: 2.8 g/dL (ref 1.5–4.5)
Glucose: 88 mg/dL (ref 70–99)
Potassium: 5.3 mmol/L — ABNORMAL HIGH (ref 3.5–5.2)
Sodium: 139 mmol/L (ref 134–144)
Total Protein: 7.2 g/dL (ref 6.0–8.5)
eGFR: 98 mL/min/{1.73_m2} (ref >59)

## 2021-02-26 LAB — VITAMIN D 25 HYDROXY (VIT D DEFICIENCY, FRACTURES): Vit D, 25-Hydroxy: 74.5 ng/mL (ref 30.0–100.0)

## 2021-02-26 LAB — INSULIN, RANDOM: INSULIN: 16.6 u[IU]/mL (ref 2.6–24.9)

## 2021-02-26 LAB — HEMOGLOBIN A1C
Est. average glucose Bld gHb Est-mCnc: 117 mg/dL
Hgb A1c MFr Bld: 5.7 % — ABNORMAL HIGH (ref 4.8–5.6)

## 2021-02-26 LAB — VITAMIN B12: Vitamin B-12: 400 pg/mL (ref 232–1245)

## 2021-03-09 ENCOUNTER — Other Ambulatory Visit: Payer: Self-pay

## 2021-03-09 ENCOUNTER — Ambulatory Visit: Payer: BC Managed Care – PPO | Admitting: Internal Medicine

## 2021-03-14 NOTE — Patient Instructions (Signed)
Continue with sits baths for 20 minutes in warm soapy water .  Anusol HC 2.5% to rectal area twice daily.  Call if not improving in a couple of days.

## 2021-03-14 NOTE — Progress Notes (Signed)
° °  Subjective:    Patient ID: [name], female    DOB: [birth year], 41 y.o.   MRN: [id number]  HPI 41 year old Female seen with complaint of inflamed hemorrhoid onset week of January 30.  She tried Preparation H ointment.  Symptoms got worse.  She felt at 1 point there were actually 3 inflamed hemorrhoids.  She tried a different formulation of Preparation H and did receive some relief.  Was able to go back to work.  Having some shooting pains in rectal area.  Has not been sleeping well for couple of nights because pain awakens her.  She tried sitz bath's.  She was having some issues with bowel movements.  Has not been straining.  Asking to see a gastroenterologist.  Office visit was advised here. She continues to be followed at [facility] healthy weight clinic for obesity.  She takes Prozac and Wellbutrin for depression.  Has had some situational stress but that seems to be improving.  Has seen counselor at [facility] in [place].  History of impaired glucose tolerance with hemoglobin A1c 5.7% in October.  Takes vitamin D supplementation.   Review of Systems currently no rectal bleeding from hemorrhoids     Objective:   Physical Exam Blood pressure 122/78 pulse 111 but she is anxious temperature 98.8 degrees pulse oximetry 98% weight 217 pounds 8 ounces BMI 36.19  Examination of rectal area reveals an inflamed hemorrhoid about the size of a large grape.  It is currently not bleeding.     Assessment & Plan:  Thrombosed hemorrhoid  Plan: Patient was advised to continue with sits baths for 20 minutes at least once a day for several days.  It seems that things are getting a bit better.  She was prescribed Anusol HC 2.5% rectal cream to use in rectal area twice daily.  I do not think she needs surgical intervention.  She will call if not improving.

## 2021-03-25 ENCOUNTER — Encounter (INDEPENDENT_AMBULATORY_CARE_PROVIDER_SITE_OTHER): Payer: Self-pay | Admitting: Adult Health

## 2021-03-25 ENCOUNTER — Other Ambulatory Visit (INDEPENDENT_AMBULATORY_CARE_PROVIDER_SITE_OTHER): Payer: Self-pay | Admitting: Adult Health

## 2021-03-25 ENCOUNTER — Ambulatory Visit (INDEPENDENT_AMBULATORY_CARE_PROVIDER_SITE_OTHER): Payer: BC Managed Care – PPO | Admitting: Adult Health

## 2021-03-25 ENCOUNTER — Other Ambulatory Visit: Payer: Self-pay

## 2021-03-25 VITALS — BP 135/81 | HR 81 | Temp 98.6°F | Ht 65.0 in | Wt 215.0 lb

## 2021-03-25 DIAGNOSIS — E669 Obesity, unspecified: Secondary | ICD-10-CM | POA: Diagnosis not present

## 2021-03-25 DIAGNOSIS — Z9189 Other specified personal risk factors, not elsewhere classified: Secondary | ICD-10-CM

## 2021-03-25 DIAGNOSIS — R7303 Prediabetes: Secondary | ICD-10-CM

## 2021-03-25 DIAGNOSIS — E875 Hyperkalemia: Secondary | ICD-10-CM | POA: Insufficient documentation

## 2021-03-25 DIAGNOSIS — E559 Vitamin D deficiency, unspecified: Secondary | ICD-10-CM

## 2021-03-25 DIAGNOSIS — Z6835 Body mass index (BMI) 35.0-35.9, adult: Secondary | ICD-10-CM

## 2021-03-25 DIAGNOSIS — Z6838 Body mass index (BMI) 38.0-38.9, adult: Secondary | ICD-10-CM

## 2021-03-25 MED ORDER — METFORMIN HCL 500 MG PO TABS: ORAL_TABLET | ORAL | 0 refills | Status: DC

## 2021-03-25 NOTE — Progress Notes (Signed)
? ? ? ?Chief Complaint:  ? ?OBESITY ?[name] is here to discuss her progress with her obesity treatment plan along with follow-up of her obesity related diagnoses. [name] is on the Category 3 Plan and states she is following her eating plan approximately 10% of the time. [name] states she is not exercising regularly at this time. ? ?Today's visit was #: 30 ?Starting weight: 233 lbs ?Starting date: 02/06/2018 ?Today's weight: 215 lbs ?Today's date: 03/25/2021 ?Total lbs lost to date: 18 lbs ?Total lbs lost since last in-office visit: 0 ? ?Interim History:  ?[name] states, "[name] been struggling with meal plannin/prepping". ?She endorses the following typical daily intake; ?Skips breakfast. ?Often combines lunch/dinner- protein with vegetables, often paired with a CHO. ? ?Monday and Wednesday - works remotely.   ?Tuesday, Thursday, and Friday - in office.  She says she is more compliant with the plan when in the the office. ? ?Subjective:  ? ?1. Vitamin D deficiency ?Discussed labs with patient today.  ?On 02/25/2021, vitamin D level - 74.5 - at goal. ?Currently on OTC vitamin D3 5000 IU daily. ? ?2. Pre-diabetes ?Wornening.  Discussed labs with patient today.Marland Kitchen ?On 02/25/2021, CMP - GFR 98, B12 400. ?Started on metformin on 11/616/2022 - currently on Metformin '500mg'$  QD. ?A1c worsening despite Metformin therapy. ?She denies family hx of MTC or personal hx of pancreatitis. ?She is on oral BC- Norgestimate-Eth Estradiol. ? ?3. Hyperkalemia ?Discussed labs with patient today.  ?On 02/22/2021, CMP - potasium 5.3.  No CT/palpitations ? ?4. At risk for diabetes mellitus ?[name] is at higher than average risk for developing diabetes due to worsening IR and obesity.  ? ?Assessment/Plan:  ? ?1. Vitamin D deficiency ?Modify OTD vitamin D3 5000 IU every other day.  Check lags in 2 months. ? ?2. Pre-diabetes ?Increase plan compliance to at lease 80%. ?Refill metformin 500 mg at lunch. ? ?- Refill metFORMIN (GLUCOPHAGE) 500 MG tablet; 1 tab  with lunch  Dispense: 30 tablet; Refill: 0 ? ?3. Hyperkalemia ?Check CMP today. ? ?- Comprehensive metabolic panel ? ?4. At risk for diabetes mellitus ?[name] was given approximately 15 minutes of diabetic education and counseling today. We discussed intensive lifestyle modifications today with an emphasis on weight loss as well as increasing exercise and decreasing simple carbohydrates in her diet. We also reviewed medication options with an emphasis on risk versus benefits of those discussed. ? ?Repetitive spaced learning was employed today to elicit superior memory formation and behavioral change. ? ?5. Obesity, current BMI 35.8 ? ?[name] is currently in the action stage of change. As such, her goal is to continue with weight loss efforts. She has agreed to the Category 3 Plan.  ? ?Exercise goals: Increase daily walking to 3 times per week. ? ?Behavioral modification strategies: increasing lean protein intake, decreasing simple carbohydrates, keeping healthy foods in the home, ways to avoid boredom eating, and planning for success. ? ?[name] has agreed to follow-up with our clinic in 3-4 weeks. She was informed of the importance of frequent follow-up visits to maximize her success with intensive lifestyle modifications for her multiple health conditions.  ? ?Objective:  ? ?Blood pressure 135/81, pulse 81, temperature 98.6 ?F (37 ?C), height '5\' 5"'$  (1.651 m), weight 215 lb (97.5 kg), SpO2 99 %, unknown if currently breastfeeding. ?Body mass index is 35.78 kg/m?. ? ?General: Cooperative, alert, well developed, in no acute distress. ?HEENT: Conjunctivae and lids unremarkable. ?Cardiovascular: Regular rhythm.  ?Lungs: Normal work of breathing. ?Neurologic: No focal deficits.  ? ?  Lab Results  ?Component Value Date  ? CREATININE 0.78 02/25/2021  ? BUN 11 02/25/2021  ? NA 139 02/25/2021  ? K 5.3 (H) 02/25/2021  ? CL 103 02/25/2021  ? CO2 22 02/25/2021  ? ?Lab Results  ?Component Value Date  ? ALT 22 02/25/2021  ? AST 22  02/25/2021  ? ALKPHOS 52 02/25/2021  ? BILITOT 0.5 02/25/2021  ? ?Lab Results  ?Component Value Date  ? HGBA1C 5.7 (H) 02/25/2021  ? HGBA1C 5.7 (H) 11/05/2020  ? HGBA1C 5.6 07/15/2020  ? HGBA1C 5.8 (H) 03/18/2020  ? HGBA1C 5.7 (H) 10/03/2019  ? ?Lab Results  ?Component Value Date  ? INSULIN 16.6 02/25/2021  ? INSULIN 20.4 11/05/2020  ? INSULIN 18.4 07/15/2020  ? INSULIN 29.1 (H) 10/03/2019  ? INSULIN 19.3 05/15/2019  ? ?Lab Results  ?Component Value Date  ? TSH 1.97 03/18/2020  ? ?Lab Results  ?Component Value Date  ? CHOL 140 07/15/2020  ? HDL 54 07/15/2020  ? [place] 72 07/15/2020  ? TRIG 67 07/15/2020  ? CHOLHDL 2.6 07/15/2020  ? ?Lab Results  ?Component Value Date  ? VD25OH 74.5 02/25/2021  ? VD25OH 61.5 11/05/2020  ? VD25OH 71.0 07/15/2020  ? ?Lab Results  ?Component Value Date  ? WBC 5.0 05/16/2020  ? HGB 12.3 05/16/2020  ? HCT 37.7 05/16/2020  ? MCV 87.3 05/16/2020  ? PLT 406 (H) 05/16/2020  ? ?Lab Results  ?Component Value Date  ? IRON 54 09/30/2017  ? TIBC 373 09/30/2017  ? FERRITIN 81 09/30/2017  ? ?Attestation Statements:  ? ?Reviewed by clinician on day of visit: allergies, medications, problem list, medical history, surgical history, family history, social history, and previous encounter notes. ? ?I, Water quality scientist, CMA, am acting as Location manager for Mina Marble, NP. ? ?I have reviewed the above documentation for accuracy and completeness, and I agree with the above. - Jacques Fife d. Egon Dittus, NP-C ?

## 2021-03-26 LAB — COMPREHENSIVE METABOLIC PANEL
ALT: 16 IU/L (ref 0–32)
AST: 16 IU/L (ref 0–40)
Albumin/Globulin Ratio: 1.5 (ref 1.2–2.2)
Albumin: 4.4 g/dL (ref 3.8–4.8)
Alkaline Phosphatase: 47 IU/L (ref 44–121)
BUN/Creatinine Ratio: 11 (ref 9–23)
BUN: 8 mg/dL (ref 6–24)
Bilirubin Total: 0.3 mg/dL (ref 0.0–1.2)
CO2: 21 mmol/L (ref 20–29)
Calcium: 9.3 mg/dL (ref 8.7–10.2)
Chloride: 103 mmol/L (ref 96–106)
Creatinine, Ser: 0.74 mg/dL (ref 0.57–1.00)
Globulin, Total: 3 g/dL (ref 1.5–4.5)
Glucose: 86 mg/dL (ref 70–99)
Potassium: 4.2 mmol/L (ref 3.5–5.2)
Sodium: 136 mmol/L (ref 134–144)
Total Protein: 7.4 g/dL (ref 6.0–8.5)
eGFR: 104 mL/min/{1.73_m2} (ref >59)

## 2021-03-30 ENCOUNTER — Encounter (INDEPENDENT_AMBULATORY_CARE_PROVIDER_SITE_OTHER): Payer: Self-pay | Admitting: Adult Health

## 2021-04-22 ENCOUNTER — Ambulatory Visit (INDEPENDENT_AMBULATORY_CARE_PROVIDER_SITE_OTHER): Payer: BC Managed Care – PPO | Admitting: Adult Health

## 2021-04-22 ENCOUNTER — Encounter (INDEPENDENT_AMBULATORY_CARE_PROVIDER_SITE_OTHER): Payer: Self-pay | Admitting: Adult Health

## 2021-04-22 VITALS — BP 107/72 | HR 85 | Temp 98.4°F | Ht 65.0 in | Wt 219.0 lb

## 2021-04-22 DIAGNOSIS — Z6836 Body mass index (BMI) 36.0-36.9, adult: Secondary | ICD-10-CM

## 2021-04-22 DIAGNOSIS — R7303 Prediabetes: Secondary | ICD-10-CM | POA: Diagnosis not present

## 2021-04-22 DIAGNOSIS — E559 Vitamin D deficiency, unspecified: Secondary | ICD-10-CM

## 2021-04-22 DIAGNOSIS — E669 Obesity, unspecified: Secondary | ICD-10-CM

## 2021-04-22 DIAGNOSIS — E875 Hyperkalemia: Secondary | ICD-10-CM

## 2021-04-22 DIAGNOSIS — Z9189 Other specified personal risk factors, not elsewhere classified: Secondary | ICD-10-CM

## 2021-04-22 MED ORDER — WEGOVY 0.25 MG/0.5ML ~~LOC~~ SOAJ: 0.2500 mg | SUBCUTANEOUS | 0 refills | Status: DC

## 2021-04-28 ENCOUNTER — Encounter (INDEPENDENT_AMBULATORY_CARE_PROVIDER_SITE_OTHER): Payer: Self-pay

## 2021-04-28 ENCOUNTER — Telehealth (INDEPENDENT_AMBULATORY_CARE_PROVIDER_SITE_OTHER): Payer: Self-pay | Admitting: Adult Health

## 2021-04-28 NOTE — Telephone Encounter (Signed)
Prior authorization approved for [facility]. Effective: 04/22/2021 - 11/22/2021. Patient sent approval message via mychart.  ?

## 2021-04-30 NOTE — Progress Notes (Signed)
? ? ? ?Chief Complaint:  ? ?OBESITY ?[name] is here to discuss her progress with her obesity treatment plan along with follow-up of her obesity related diagnoses. [name] is on the Category 3 Plan and states she is following her eating plan approximately 20% of the time. [name] states she is walking for 30 minutes 2 times per week. ? ?Today's visit was #: 27 ?Starting weight: 233 lbs ?Starting date: 02/06/2018 ?Today's weight: 219 lbs ?Today's date: 04/22/2021 ?Total lbs lost to date: 14 lbs ?Total lbs lost since last in-office visit: 0 ? ?Interim History:  ?[name] started a new job on 4/10. ?Spring sports - daughter [name], 50) - volleyball, dance.  Son ([name], 6) - football, soccer. ?Marital stress, she states that she and her husband at "off". ?She eats breakfast and snacks but does not have lunch or dinner. ? ?Subjective:  ? ?1. Pre-diabetes ?Discussed labs with patient today.  ?She is on metformin 500 mg at lunch - continues to experience polyphagia. ? ?2. Hyperkalemia ?Discussed labs with patient today.  ?On 03/25/2021, CMP - potassium 4.2. ?No acute cardiac symptoms. ? ?3. Vitamin D deficiency ?She is on OTC vitamin D3 5000 daily. ? ?4. At risk for nausea ?[name] is at risk for nausea due to taking a GLP-1 for obesity. ? ?Assessment/Plan:  ? ?1. Pre-diabetes ?Start [place], then stop metformin. ? ?2. Hyperkalemia ?Monitor labs. ? ?3. Vitamin D deficiency ?Continue OTC supplement. ? ?4. At risk for nausea ?[name] was given approximately 15 minutes of nausea prevention counseling today. [name] is at risk for nausea due to her new or current medication. She was encouraged to titrate her medication slowly, make sure to stay hydrated, eat smaller portions throughout the day, and avoid high fat meals.  ? ?5. Obesity, current BMI 36.4 ? ?[name] is currently in the action stage of change. As such, her goal is to continue with weight loss efforts. She has agreed to the Category 3 Plan.  ? ?Start Wegovy 0.25 mg once  weekly. ?- Start Semaglutide-Weight Management (WEGOVY) 0.25 MG/0.5ML SOAJ; Inject 0.25 mg into the skin once a week.  Dispense: 2 mL; Refill: 0 ? ?Exercise goals:  Walk 1 time per week. ? ?Behavioral modification strategies: increasing lean protein intake, decreasing simple carbohydrates, meal planning and cooking strategies, keeping healthy foods in the home, and planning for success. ? ?[name] has agreed to follow-up with our clinic in 4 weeks. She was informed of the importance of frequent follow-up visits to maximize her success with intensive lifestyle modifications for her multiple health conditions.  ? ?Objective:  ? ?Blood pressure 107/72, pulse 85, temperature 98.4 ?F (36.9 ?C), height '5\' 5"'$  (1.651 m), weight 219 lb (99.3 kg), SpO2 99 %, unknown if currently breastfeeding. ?Body mass index is 36.44 kg/m?. ? ?General: Cooperative, alert, well developed, in no acute distress. ?HEENT: Conjunctivae and lids unremarkable. ?Cardiovascular: Regular rhythm.  ?Lungs: Normal work of breathing. ?Neurologic: No focal deficits.  ? ?Lab Results  ?Component Value Date  ? CREATININE 0.74 03/25/2021  ? BUN 8 03/25/2021  ? NA 136 03/25/2021  ? K 4.2 03/25/2021  ? CL 103 03/25/2021  ? CO2 21 03/25/2021  ? ?Lab Results  ?Component Value Date  ? ALT 16 03/25/2021  ? AST 16 03/25/2021  ? ALKPHOS 47 03/25/2021  ? BILITOT 0.3 03/25/2021  ? ?Lab Results  ?Component Value Date  ? HGBA1C 5.7 (H) 02/25/2021  ? HGBA1C 5.7 (H) 11/05/2020  ? HGBA1C 5.6 07/15/2020  ? HGBA1C 5.8 (  H) 03/18/2020  ? HGBA1C 5.7 (H) 10/03/2019  ? ?Lab Results  ?Component Value Date  ? INSULIN 16.6 02/25/2021  ? INSULIN 20.4 11/05/2020  ? INSULIN 18.4 07/15/2020  ? INSULIN 29.1 (H) 10/03/2019  ? INSULIN 19.3 05/15/2019  ? ?Lab Results  ?Component Value Date  ? TSH 1.97 03/18/2020  ? ?Lab Results  ?Component Value Date  ? CHOL 140 07/15/2020  ? HDL 54 07/15/2020  ? [place] 72 07/15/2020  ? TRIG 67 07/15/2020  ? CHOLHDL 2.6 07/15/2020  ? ?Lab Results  ?Component  Value Date  ? VD25OH 74.5 02/25/2021  ? VD25OH 61.5 11/05/2020  ? VD25OH 71.0 07/15/2020  ? ?Lab Results  ?Component Value Date  ? WBC 5.0 05/16/2020  ? HGB 12.3 05/16/2020  ? HCT 37.7 05/16/2020  ? MCV 87.3 05/16/2020  ? PLT 406 (H) 05/16/2020  ? ?Lab Results  ?Component Value Date  ? IRON 54 09/30/2017  ? TIBC 373 09/30/2017  ? FERRITIN 81 09/30/2017  ? ?Attestation Statements:  ? ?Reviewed by clinician on day of visit: allergies, medications, problem list, medical history, surgical history, family history, social history, and previous encounter notes. ? ?Time spent on visit including pre-visit chart review and post-visit care and charting was 27 minutes.  ? ?I, Water quality scientist, CMA, am acting as Location manager for Mina Marble, NP. ? ?I have reviewed the above documentation for accuracy and completeness, and I agree with the above. -  Donielle Kaigler d. Andrian Sabala, NP-C ?

## 2021-05-09 ENCOUNTER — Other Ambulatory Visit (INDEPENDENT_AMBULATORY_CARE_PROVIDER_SITE_OTHER): Payer: Self-pay | Admitting: Physician Assistant

## 2021-05-09 DIAGNOSIS — E559 Vitamin D deficiency, unspecified: Secondary | ICD-10-CM

## 2021-05-22 ENCOUNTER — Encounter (INDEPENDENT_AMBULATORY_CARE_PROVIDER_SITE_OTHER): Payer: Self-pay | Admitting: Adult Health

## 2021-05-27 ENCOUNTER — Ambulatory Visit (INDEPENDENT_AMBULATORY_CARE_PROVIDER_SITE_OTHER): Payer: BC Managed Care – PPO | Admitting: Adult Health

## 2021-06-24 ENCOUNTER — Encounter: Payer: BC Managed Care – PPO | Admitting: Internal Medicine

## 2021-06-25 ENCOUNTER — Ambulatory Visit (INDEPENDENT_AMBULATORY_CARE_PROVIDER_SITE_OTHER): Payer: BC Managed Care – PPO | Admitting: Adult Health

## 2021-06-25 ENCOUNTER — Encounter (INDEPENDENT_AMBULATORY_CARE_PROVIDER_SITE_OTHER): Payer: Self-pay | Admitting: Adult Health

## 2021-06-25 VITALS — BP 124/83 | HR 80 | Temp 98.1°F | Ht 65.0 in | Wt 222.0 lb

## 2021-06-25 DIAGNOSIS — R7303 Prediabetes: Secondary | ICD-10-CM

## 2021-06-25 DIAGNOSIS — Z6837 Body mass index (BMI) 37.0-37.9, adult: Secondary | ICD-10-CM

## 2021-06-25 DIAGNOSIS — E669 Obesity, unspecified: Secondary | ICD-10-CM

## 2021-06-29 NOTE — Progress Notes (Signed)
Chief Complaint:   OBESITY [name] is here to discuss her progress with her obesity treatment plan along with follow-up of her obesity related diagnoses. [name] is on the Category 3 Plan and states she is following her eating plan approximately 0% of the time. [name] states she is doing 0 minutes 0 times per week.  Today's visit was #: 62 Starting weight: 233 lbs Starting date: 02/06/2018 Today's weight: 222 lbs Today's date: 06/25/2021 Total lbs lost to date: 11 Total lbs lost since last in-office visit: 0  Interim History:  On 04/22/2021 started on Wegovy 0.25 mg-she has had 4 doses- tolerating well.   Last dose was on 05/24/2021.    She recently started new position at [name] data [employer].   Working > 40 hours/week with transition to new position.  Subjective:   1. Pre-diabetes On 04/22/2021 started on Wegovy 0.25 mg-she has had 4 doses. Last dose was on 05/24/2021.   She denies mass in neck, dysphagia, dyspepsia, persistent hoarseness, abd pain, or N/V/Constipation.  Assessment/Plan:   1. Pre-diabetes Resume metformin 500 mg daily with lunch #30, and we will refill for 1 month.  2. Obesity, current BMI 37.0 [name] is currently in the action stage of change. As such, her goal is to continue with weight loss efforts. She has agreed to the Category 3 Plan and keeping a food journal and adhering to recommended goals of 450-600 calories and 40 grams of protein.   Exercise goals: No exercise has been prescribed at this time.  Behavioral modification strategies: increasing lean protein intake, decreasing simple carbohydrates, decreasing eating out, meal planning and cooking strategies, keeping healthy foods in the home, and planning for success.  [name] has agreed to follow-up with our clinic in 5 to 6 weeks. She was informed of the importance of frequent follow-up visits to maximize her success with intensive lifestyle modifications for her multiple health conditions.    Objective:   Blood pressure 124/83, pulse 80, temperature 98.1 F (36.7 C), height '5\' 5"'$  (1.651 m), weight 222 lb (100.7 kg), SpO2 99 %, unknown if currently breastfeeding. Body mass index is 36.94 kg/m.  General: Cooperative, alert, well developed, in no acute distress. HEENT: Conjunctivae and lids unremarkable. Cardiovascular: Regular rhythm.  Lungs: Normal work of breathing. Neurologic: No focal deficits.   Lab Results  Component Value Date   CREATININE 0.74 03/25/2021   BUN 8 03/25/2021   NA 136 03/25/2021   K 4.2 03/25/2021   CL 103 03/25/2021   CO2 21 03/25/2021   Lab Results  Component Value Date   ALT 16 03/25/2021   AST 16 03/25/2021   ALKPHOS 47 03/25/2021   BILITOT 0.3 03/25/2021   Lab Results  Component Value Date   HGBA1C 5.7 (H) 02/25/2021   HGBA1C 5.7 (H) 11/05/2020   HGBA1C 5.6 07/15/2020   HGBA1C 5.8 (H) 03/18/2020   HGBA1C 5.7 (H) 10/03/2019   Lab Results  Component Value Date   INSULIN 16.6 02/25/2021   INSULIN 20.4 11/05/2020   INSULIN 18.4 07/15/2020   INSULIN 29.1 (H) 10/03/2019   INSULIN 19.3 05/15/2019   Lab Results  Component Value Date   TSH 1.97 03/18/2020   Lab Results  Component Value Date   CHOL 140 07/15/2020   HDL 54 07/15/2020   LDLCALC 72 07/15/2020   TRIG 67 07/15/2020   CHOLHDL 2.6 07/15/2020   Lab Results  Component Value Date   VD25OH 74.5 02/25/2021   VD25OH 61.5 11/05/2020  VD25OH 71.0 07/15/2020   Lab Results  Component Value Date   WBC 5.0 05/16/2020   HGB 12.3 05/16/2020   HCT 37.7 05/16/2020   MCV 87.3 05/16/2020   PLT 406 (H) 05/16/2020   Lab Results  Component Value Date   IRON 54 09/30/2017   TIBC 373 09/30/2017   FERRITIN 81 09/30/2017   Attestation Statements:   Reviewed by clinician on day of visit: allergies, medications, problem list, medical history, surgical history, family history, social history, and previous encounter notes.  Time spent on visit including pre-visit chart  review and post-visit care and charting was 27 minutes.   Wilhemena Durie, am acting as transcriptionist for Mina Marble, NP.  I have reviewed the above documentation for accuracy and completeness, and I agree with the above. -  Rayvn Rickerson d. Arvella Massingale, NP-C

## 2021-06-30 ENCOUNTER — Other Ambulatory Visit (INDEPENDENT_AMBULATORY_CARE_PROVIDER_SITE_OTHER): Payer: Self-pay | Admitting: Adult Health

## 2021-06-30 ENCOUNTER — Ambulatory Visit (INDEPENDENT_AMBULATORY_CARE_PROVIDER_SITE_OTHER): Payer: BC Managed Care – PPO | Admitting: Internal Medicine

## 2021-06-30 ENCOUNTER — Telehealth: Payer: Self-pay | Admitting: Internal Medicine

## 2021-06-30 ENCOUNTER — Encounter: Payer: Self-pay | Admitting: Internal Medicine

## 2021-06-30 VITALS — BP 132/82 | HR 95 | Temp 97.2°F

## 2021-06-30 DIAGNOSIS — J029 Acute pharyngitis, unspecified: Secondary | ICD-10-CM

## 2021-06-30 DIAGNOSIS — J069 Acute upper respiratory infection, unspecified: Secondary | ICD-10-CM | POA: Diagnosis not present

## 2021-06-30 DIAGNOSIS — R7303 Prediabetes: Secondary | ICD-10-CM

## 2021-06-30 DIAGNOSIS — R059 Cough, unspecified: Secondary | ICD-10-CM

## 2021-06-30 MED ORDER — AZITHROMYCIN 250 MG PO TABS: ORAL_TABLET | ORAL | 0 refills | Status: AC

## 2021-06-30 MED ORDER — FLUCONAZOLE 150 MG PO TABS: 150.0000 mg | ORAL_TABLET | Freq: Once | ORAL | 0 refills | Status: AC

## 2021-06-30 MED ORDER — METFORMIN HCL 500 MG PO TABS: ORAL_TABLET | ORAL | 0 refills | Status: DC

## 2021-06-30 MED ORDER — METHYLPREDNISOLONE ACETATE 80 MG/ML IJ SUSP
80.0000 mg | Freq: Once | INTRAMUSCULAR | Status: AC
  Administered 2021-06-30: 80 mg via INTRAMUSCULAR

## 2021-06-30 NOTE — Telephone Encounter (Signed)
Called back COVID test was negative

## 2021-06-30 NOTE — Telephone Encounter (Signed)
[name] [phone]  [name] called to say on Sunday she started having scratchy throat, headache and now she has cough and is hoarse and just does not feel well. I have ask her to do Home COVID test and call me back. Scheduled a office visit for this afternoon.assuming its negative.

## 2021-06-30 NOTE — Progress Notes (Signed)
   Subjective:    Patient ID: [name], female    DOB: 1980/07/31, 41 y.o.   MRN: [id number]  HPI 41 year old Female seen with scratchy throat, cough and hoarseness which had onset on Monday.  She noticed that she was clearing her throat some on Sunday night June 11.  Has tried Alka-Seltzer cold plus and Tylenol.  Not feeling well today.  No known COVID exposure.  Had last COVID booster December 2022.    Review of Systems see above denies fever or shaking chills, diarrhea, nausea, vomiting     Objective:   Physical Exam Blood pressure 132/82 pulse 95 regular she is afebrile.  Pulse oximetry 100% on room air.  Skin: Warm and dry.  Pharynx slightly injected without exudate.  TMs clear. Neck is supple.  Chest is clear without rales or wheezing.      Assessment & Plan:  Acute upper respiratory infection  Plan: Take Zithromax Z-PAK 2 tabs day 1 followed by 1 tab days 2 through 5.  Diflucan if needed for Candida vaginitis while on antibiotics.  Rest and drink fluids.

## 2021-07-30 ENCOUNTER — Ambulatory Visit (INDEPENDENT_AMBULATORY_CARE_PROVIDER_SITE_OTHER): Payer: BC Managed Care – PPO | Admitting: Adult Health

## 2021-08-11 ENCOUNTER — Ambulatory Visit (INDEPENDENT_AMBULATORY_CARE_PROVIDER_SITE_OTHER): Payer: BC Managed Care – PPO | Admitting: Adult Health

## 2021-08-16 ENCOUNTER — Encounter: Payer: Self-pay | Admitting: Internal Medicine

## 2021-08-16 NOTE — Patient Instructions (Signed)
Take Zithromax Z-PAK 2 tabs day 1 followed by 1 tab days 2 through 5.  Take Diflucan if needed for Candida vaginitis while on antibiotics.  Rest and drink fluids.

## 2021-08-17 ENCOUNTER — Encounter (INDEPENDENT_AMBULATORY_CARE_PROVIDER_SITE_OTHER): Payer: Self-pay | Admitting: Adult Health

## 2021-08-17 ENCOUNTER — Ambulatory Visit (INDEPENDENT_AMBULATORY_CARE_PROVIDER_SITE_OTHER): Payer: BC Managed Care – PPO | Admitting: Adult Health

## 2021-08-17 VITALS — BP 110/74 | HR 74 | Temp 98.5°F | Ht 65.0 in | Wt 224.0 lb

## 2021-08-17 DIAGNOSIS — Z9189 Other specified personal risk factors, not elsewhere classified: Secondary | ICD-10-CM

## 2021-08-17 DIAGNOSIS — Z6837 Body mass index (BMI) 37.0-37.9, adult: Secondary | ICD-10-CM | POA: Diagnosis not present

## 2021-08-17 DIAGNOSIS — R7303 Prediabetes: Secondary | ICD-10-CM | POA: Diagnosis not present

## 2021-08-17 DIAGNOSIS — E669 Obesity, unspecified: Secondary | ICD-10-CM

## 2021-08-17 MED ORDER — WEGOVY 0.25 MG/0.5ML ~~LOC~~ SOAJ: 0.2500 mg | SUBCUTANEOUS | 0 refills | Status: DC

## 2021-08-24 NOTE — Progress Notes (Unsigned)
Chief Complaint:   OBESITY [name] is here to discuss her progress with her obesity treatment plan along with follow-up of her obesity related diagnoses. [name] is on the Category 3 Plan and keeping a food journal and adhering to recommended goals of 450 to 600 calories and 40 grams of  protein and states she is following her eating plan approximately 0% of the time. [name] states she is not exercising.  Today's visit was #: 79 Starting weight: 233 lbs Starting date: 02/06/18 Today's weight: 224 lbs Today's date: 08/17/2021 Total lbs lost to date: 9 Total lbs lost since last in-office visit: 0  Interim History: [name] endorses that she is often skipping breakfast and frequently eating out. 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 Her last dose of Wegovy was 0.'25mg'$  in May of 2023. Her breakfast is a Premier protein shake, lunch is 700 calories and 35 grams of protein, and dinner is 900 calories and 40 grams of protein.  Subjective:   1. Pre-diabetes [name]'s last 2 A1c levels was 5.7. Her father and brother both have diabetes.  2. At risk for constipation [name] is at risk of constipation due to restarting Wegovy.  Assessment/Plan:   1. Pre-diabetes [name] agrees to restart GLP-1 therapy and to follow up at the agreed upon time.   2. At risk for constipation [name] was given approximately 15 minutes of counseling today regarding prevention of constipation. She was encouraged to increase water and fiber intake.    3. Obesity, current BMI 37.3 [name] agrees to restart [facility] 0.'25mg'$  once a week and to follow up as directed.   - Semaglutide-Weight Management (WEGOVY) 0.25 MG/0.5ML SOAJ; Inject 0.25 mg into the skin once a week.  Dispense: 2 mL; Refill: 0  [name] is currently in the action stage of change. As such, her goal is to continue with weight loss efforts. She has agreed to  the Category 3 Plan and keeping a food journal and adhering to recommended goals of 450 to 600 calories and 40 grams of protein.   Exercise goals: No exercise has been prescribed at this time.  Behavioral modification strategies: increasing lean protein intake, decreasing simple carbohydrates, meal planning and cooking strategies, keeping healthy foods in the home, and planning for success.  [name] has agreed to follow-up with our clinic in 4 weeks. She was informed of the importance of frequent follow-up visits to maximize her success with intensive lifestyle modifications for her multiple health conditions.   Objective:   Pulse 74, temperature 98.5 F (36.9 C), height '5\' 5"'$  (1.651 m), weight 224 lb (101.6 kg), SpO2 99 %, unknown if currently breastfeeding. Body mass index is 37.28 kg/m.  General: Cooperative, alert, well developed, in no acute distress. HEENT: Conjunctivae and lids unremarkable. Cardiovascular: Regular rhythm.  Lungs: Normal work of breathing. Neurologic: No focal deficits.   Lab Results  Component Value Date   CREATININE 0.74 03/25/2021   BUN 8 03/25/2021   NA 136 03/25/2021   K 4.2 03/25/2021   CL 103 03/25/2021   CO2 21 03/25/2021   Lab Results  Component Value Date   ALT 16 03/25/2021   AST 16 03/25/2021   ALKPHOS 47 03/25/2021   BILITOT 0.3 03/25/2021   Lab Results  Component Value Date   HGBA1C 5.7 (H) 02/25/2021   HGBA1C 5.7 (H) 11/05/2020   HGBA1C 5.6  07/15/2020   HGBA1C 5.8 (H) 03/18/2020   HGBA1C 5.7 (H) 10/03/2019   Lab Results  Component Value Date   INSULIN 16.6 02/25/2021   INSULIN 20.4 11/05/2020   INSULIN 18.4 07/15/2020   INSULIN 29.1 (H) 10/03/2019   INSULIN 19.3 05/15/2019   Lab Results  Component Value Date   TSH 1.97 03/18/2020   Lab Results  Component Value Date   CHOL 140 07/15/2020   HDL 54 07/15/2020   LDLCALC 72 07/15/2020   TRIG 67 07/15/2020   CHOLHDL 2.6 07/15/2020   Lab Results  Component Value Date    VD25OH 74.5 02/25/2021   VD25OH 61.5 11/05/2020   VD25OH 71.0 07/15/2020   Lab Results  Component Value Date   WBC 5.0 05/16/2020   HGB 12.3 05/16/2020   HCT 37.7 05/16/2020   MCV 87.3 05/16/2020   PLT 406 (H) 05/16/2020   Lab Results  Component Value Date   IRON 54 09/30/2017   TIBC 373 09/30/2017   FERRITIN 81 09/30/2017   Attestation Statements:   Reviewed by clinician on day of visit: allergies, medications, problem list, medical history, surgical history, family history, social history, and previous encounter notes.  I, Marcille Blanco, am acting as Location manager for Mina Marble, NP.  I have reviewed the above documentation for accuracy and completeness, and I agree with the above. -  ***

## 2021-08-26 ENCOUNTER — Encounter (INDEPENDENT_AMBULATORY_CARE_PROVIDER_SITE_OTHER): Payer: Self-pay

## 2021-09-15 ENCOUNTER — Encounter (INDEPENDENT_AMBULATORY_CARE_PROVIDER_SITE_OTHER): Payer: Self-pay | Admitting: Adult Health

## 2021-09-15 ENCOUNTER — Ambulatory Visit (INDEPENDENT_AMBULATORY_CARE_PROVIDER_SITE_OTHER): Payer: BC Managed Care – PPO | Admitting: Adult Health

## 2021-09-15 VITALS — BP 137/78 | HR 83 | Temp 98.3°F | Ht 65.0 in | Wt 223.0 lb

## 2021-09-15 DIAGNOSIS — R7303 Prediabetes: Secondary | ICD-10-CM

## 2021-09-15 DIAGNOSIS — E559 Vitamin D deficiency, unspecified: Secondary | ICD-10-CM | POA: Diagnosis not present

## 2021-09-15 DIAGNOSIS — E669 Obesity, unspecified: Secondary | ICD-10-CM | POA: Diagnosis not present

## 2021-09-15 DIAGNOSIS — Z6837 Body mass index (BMI) 37.0-37.9, adult: Secondary | ICD-10-CM | POA: Diagnosis not present

## 2021-09-16 LAB — COMPREHENSIVE METABOLIC PANEL
ALT: 14 IU/L (ref 0–32)
AST: 12 IU/L (ref 0–40)
Albumin/Globulin Ratio: 1.4 (ref 1.2–2.2)
Albumin: 4.3 g/dL (ref 3.9–4.9)
Alkaline Phosphatase: 53 IU/L (ref 44–121)
BUN/Creatinine Ratio: 13 (ref 9–23)
BUN: 10 mg/dL (ref 6–24)
Bilirubin Total: 0.3 mg/dL (ref 0.0–1.2)
CO2: 22 mmol/L (ref 20–29)
Calcium: 9.3 mg/dL (ref 8.7–10.2)
Chloride: 106 mmol/L (ref 96–106)
Creatinine, Ser: 0.77 mg/dL (ref 0.57–1.00)
Globulin, Total: 3.1 g/dL (ref 1.5–4.5)
Glucose: 98 mg/dL (ref 70–99)
Potassium: 4.5 mmol/L (ref 3.5–5.2)
Sodium: 141 mmol/L (ref 134–144)
Total Protein: 7.4 g/dL (ref 6.0–8.5)
eGFR: 99 mL/min/{1.73_m2} (ref >59)

## 2021-09-16 LAB — INSULIN, RANDOM: INSULIN: 15 u[IU]/mL (ref 2.6–24.9)

## 2021-09-16 LAB — HEMOGLOBIN A1C
Est. average glucose Bld gHb Est-mCnc: 114 mg/dL
Hgb A1c MFr Bld: 5.6 % (ref 4.8–5.6)

## 2021-09-16 LAB — VITAMIN D 25 HYDROXY (VIT D DEFICIENCY, FRACTURES): Vit D, 25-Hydroxy: 56 ng/mL (ref 30.0–100.0)

## 2021-09-19 NOTE — Progress Notes (Unsigned)
Chief Complaint:   OBESITY Lillyanna is here to discuss her progress with her obesity treatment plan along with follow-up of her obesity related diagnoses. Laurice is on the Category 3 Plan and states she is following her eating plan approximately 50% of the time. Mallissa states she is walking 20 minutes 4 times per week.  Today's visit was #: 20 Starting weight: 233 lbs Starting date: 02/06/2018 Today's weight: 223 lbs Today's date: 09/15/2021 Total lbs lost to date: 10 lbs Total lbs lost since last in-office visit: 1 lb  Interim History: Wegovy 0.25 mg,, still unavailable due to national drug shortage.  Last dose of Wegovy 0.25 mg approximately April 2023.   Subjective:   1. Vitamin D deficiency She is on OTC Vitamin D-3, 5,000 IU daily.   2. Pre-diabetes Unable ***   Assessment/Plan:   1. Vitamin D deficiency Check labs  - VITAMIN D 25 Hydroxy (Vit-D Deficiency, Fractures)  2. Pre-diabetes Check labs  - Comprehensive metabolic panel - Hemoglobin A1c - Insulin, random  3. Obesity, current BMI 37.1 Terryann is currently in the action stage of change. As such, her goal is to continue with weight loss efforts. She has agreed to the Category 3 Plan.   Exercise goals:  As is.   Behavioral modification strategies: increasing lean protein intake, decreasing simple carbohydrates, meal planning and cooking strategies, keeping healthy foods in the home, and planning for success.  Ashland has agreed to follow-up with our clinic in 4 weeks. She was informed of the importance of frequent follow-up visits to maximize her success with intensive lifestyle modifications for her multiple health conditions.   Preslynn was informed we would discuss her lab results at her next visit unless there is a critical issue that needs to be addressed sooner. Glory agreed to keep her next visit at the agreed upon time to discuss these results.  Objective:   Blood pressure 137/78, pulse 83,  temperature 98.3 F (36.8 C), height '5\' 5"'$  (1.651 m), weight 223 lb (101.2 kg), SpO2 98 %, unknown if currently breastfeeding. Body mass index is 37.11 kg/m.  General: Cooperative, alert, well developed, in no acute distress. HEENT: Conjunctivae and lids unremarkable. Cardiovascular: Regular rhythm.  Lungs: Normal work of breathing. Neurologic: No focal deficits.   Lab Results  Component Value Date   CREATININE 0.77 09/15/2021   BUN 10 09/15/2021   NA 141 09/15/2021   K 4.5 09/15/2021   CL 106 09/15/2021   CO2 22 09/15/2021   Lab Results  Component Value Date   ALT 14 09/15/2021   AST 12 09/15/2021   ALKPHOS 53 09/15/2021   BILITOT 0.3 09/15/2021   Lab Results  Component Value Date   HGBA1C 5.6 09/15/2021   HGBA1C 5.7 (H) 02/25/2021   HGBA1C 5.7 (H) 11/05/2020   HGBA1C 5.6 07/15/2020   HGBA1C 5.8 (H) 03/18/2020   Lab Results  Component Value Date   INSULIN 15.0 09/15/2021   INSULIN 16.6 02/25/2021   INSULIN 20.4 11/05/2020   INSULIN 18.4 07/15/2020   INSULIN 29.1 (H) 10/03/2019   Lab Results  Component Value Date   TSH 1.97 03/18/2020   Lab Results  Component Value Date   CHOL 140 07/15/2020   HDL 54 07/15/2020   LDLCALC 72 07/15/2020   TRIG 67 07/15/2020   CHOLHDL 2.6 07/15/2020   Lab Results  Component Value Date   VD25OH 56.0 09/15/2021   VD25OH 74.5 02/25/2021   VD25OH 61.5 11/05/2020   Lab Results  Component Value Date   WBC 5.0 05/16/2020   HGB 12.3 05/16/2020   HCT 37.7 05/16/2020   MCV 87.3 05/16/2020   PLT 406 (H) 05/16/2020   Lab Results  Component Value Date   IRON 54 09/30/2017   TIBC 373 09/30/2017   FERRITIN 81 09/30/2017    Attestation Statements:   Reviewed by clinician on day of visit: allergies, medications, problem list, medical history, surgical history, family history, social history, and previous encounter notes.  Time spent on visit including pre-visit chart review and post-visit care and charting was 27 minutes.    I, Davy Pique, RMA, am acting as Location manager for Mina Marble, NP.  I have reviewed the above documentation for accuracy and completeness, and I agree with the above. -  ***

## 2021-10-12 ENCOUNTER — Ambulatory Visit (INDEPENDENT_AMBULATORY_CARE_PROVIDER_SITE_OTHER): Payer: BC Managed Care – PPO | Admitting: Adult Health

## 2021-10-14 ENCOUNTER — Ambulatory Visit (INDEPENDENT_AMBULATORY_CARE_PROVIDER_SITE_OTHER): Payer: BC Managed Care – PPO | Admitting: Adult Health

## 2021-10-14 ENCOUNTER — Encounter (INDEPENDENT_AMBULATORY_CARE_PROVIDER_SITE_OTHER): Payer: Self-pay | Admitting: Adult Health

## 2021-10-14 VITALS — BP 126/83 | HR 82 | Temp 98.5°F | Ht 65.0 in | Wt 224.0 lb

## 2021-10-14 DIAGNOSIS — Z6837 Body mass index (BMI) 37.0-37.9, adult: Secondary | ICD-10-CM

## 2021-10-14 DIAGNOSIS — E559 Vitamin D deficiency, unspecified: Secondary | ICD-10-CM | POA: Diagnosis not present

## 2021-10-14 DIAGNOSIS — R7303 Prediabetes: Secondary | ICD-10-CM | POA: Diagnosis not present

## 2021-10-14 DIAGNOSIS — Z6838 Body mass index (BMI) 38.0-38.9, adult: Secondary | ICD-10-CM

## 2021-10-14 DIAGNOSIS — E669 Obesity, unspecified: Secondary | ICD-10-CM

## 2021-10-14 DIAGNOSIS — R0602 Shortness of breath: Secondary | ICD-10-CM

## 2021-10-16 DIAGNOSIS — R0602 Shortness of breath: Secondary | ICD-10-CM | POA: Insufficient documentation

## 2021-10-16 NOTE — Progress Notes (Unsigned)
Chief Complaint:   OBESITY [name] is here to discuss her progress with her obesity treatment plan along with follow-up of her obesity related diagnoses. [name] is on the Category 3 Plan and states she is following her eating plan approximately 0% of the time. [name] states she is not exercising.   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    Subjective:   1. Vitamin D deficiency 09/15/2021, Vitamin D level 56.0.  Intermittently taking OTC Vitamin D-3,  5,000 IU daily.   2. Pre-diabetes *** She has been unable to obtain [facility].    3. SOBOE (shortness of breath on exertion) She endorses dyspnea with extreme exertion, denies chest pain with exertion.   Assessment/Plan:   1. Vitamin D deficiency Continue daily Vitamin D-3, 5000 IU daily.   2. Pre-diabetes Start low carb plan.   3. SOBOE (shortness of breath on exertion) Check IC at next visit.   4. Obesity, current BMI 37.4 Convert to low carb plan.   [name] is currently in the action stage of change. As such, her goal is to continue with weight loss efforts. She has agreed to following a lower carbohydrate, vegetable and lean protein rich diet plan.   Exercise goals:  Increase daily walking.   Behavioral modification strategies: increasing lean protein intake, decreasing simple carbohydrates, meal planning and cooking strategies, keeping healthy foods in the home, and planning for success.  [name] has agreed to follow-up with our clinic in 4 weeks. She was informed of the importance of frequent follow-up visits to maximize her success with intensive lifestyle  modifications for her multiple health conditions.   Objective:   Blood pressure 126/83, pulse 82, temperature 98.5 F (36.9 C), height '5\' 5"'$  (1.651 m), weight 224 lb (101.6 kg), SpO2 98 %, unknown if currently breastfeeding. Body mass index is 37.28 kg/m.  General: Cooperative, alert, well developed, in no acute distress. HEENT: Conjunctivae and lids unremarkable. Cardiovascular: Regular rhythm.  Lungs: Normal work of breathing. Neurologic: No focal deficits.   Lab Results  Component Value Date   CREATININE 0.77 09/15/2021   BUN 10 09/15/2021   NA 141 09/15/2021   K 4.5 09/15/2021   CL 106 09/15/2021   CO2 22 09/15/2021   Lab Results  Component Value Date   ALT 14 09/15/2021   AST 12 09/15/2021   ALKPHOS 53 09/15/2021   BILITOT 0.3 09/15/2021   Lab Results  Component Value Date   HGBA1C 5.6 09/15/2021   HGBA1C 5.7 (H) 02/25/2021   HGBA1C 5.7 (H) 11/05/2020   HGBA1C 5.6 07/15/2020   HGBA1C 5.8 (H) 03/18/2020   Lab Results  Component Value Date   INSULIN 15.0 09/15/2021   INSULIN 16.6 02/25/2021   INSULIN 20.4 11/05/2020   INSULIN 18.4 07/15/2020   INSULIN 29.1 (H) 10/03/2019   Lab Results  Component Value Date   TSH 1.97 03/18/2020   Lab Results  Component Value Date   CHOL 140 07/15/2020   HDL 54 07/15/2020   LDLCALC 72 07/15/2020   TRIG 67 07/15/2020   CHOLHDL 2.6 07/15/2020   Lab Results  Component Value Date   VD25OH 56.0  09/15/2021   VD25OH 74.5 02/25/2021   VD25OH 61.5 11/05/2020   Lab Results  Component Value Date   WBC 5.0 05/16/2020   HGB 12.3 05/16/2020   HCT 37.7 05/16/2020   MCV 87.3 05/16/2020   PLT 406 (H) 05/16/2020   Lab Results  Component Value Date   IRON 54 09/30/2017   TIBC 373 09/30/2017   FERRITIN 81 09/30/2017    Attestation Statements:   Reviewed by clinician on day of visit: allergies, medications, problem list, medical history, surgical history, family history, social history, and previous encounter  notes.  I, Davy Pique, RMA, am acting as Location manager for Mina Marble, NP.  I have reviewed the above documentation for accuracy and completeness, and I agree with the above. -  ***

## 2021-11-16 ENCOUNTER — Ambulatory Visit (INDEPENDENT_AMBULATORY_CARE_PROVIDER_SITE_OTHER): Payer: BC Managed Care – PPO | Admitting: Adult Health

## 2021-11-16 ENCOUNTER — Encounter (INDEPENDENT_AMBULATORY_CARE_PROVIDER_SITE_OTHER): Payer: Self-pay | Admitting: Adult Health

## 2021-11-16 VITALS — BP 121/85 | HR 84 | Temp 98.0°F | Ht 65.0 in | Wt 225.0 lb

## 2021-11-16 DIAGNOSIS — R7303 Prediabetes: Secondary | ICD-10-CM

## 2021-11-16 DIAGNOSIS — E669 Obesity, unspecified: Secondary | ICD-10-CM | POA: Diagnosis not present

## 2021-11-16 DIAGNOSIS — Z6837 Body mass index (BMI) 37.0-37.9, adult: Secondary | ICD-10-CM

## 2021-11-16 DIAGNOSIS — E559 Vitamin D deficiency, unspecified: Secondary | ICD-10-CM | POA: Diagnosis not present

## 2021-11-16 MED ORDER — WEGOVY 0.25 MG/0.5ML ~~LOC~~ SOAJ: 0.2500 mg | SUBCUTANEOUS | 0 refills | Status: DC

## 2021-11-17 LAB — COMPREHENSIVE METABOLIC PANEL
ALT: 12 IU/L (ref 0–32)
AST: 16 IU/L (ref 0–40)
Albumin/Globulin Ratio: 1.5 (ref 1.2–2.2)
Albumin: 4.3 g/dL (ref 3.9–4.9)
Alkaline Phosphatase: 54 IU/L (ref 44–121)
BUN/Creatinine Ratio: 16 (ref 9–23)
BUN: 12 mg/dL (ref 6–24)
Bilirubin Total: 0.5 mg/dL (ref 0.0–1.2)
CO2: 20 mmol/L (ref 20–29)
Calcium: 9.5 mg/dL (ref 8.7–10.2)
Chloride: 104 mmol/L (ref 96–106)
Creatinine, Ser: 0.77 mg/dL (ref 0.57–1.00)
Globulin, Total: 2.9 g/dL (ref 1.5–4.5)
Glucose: 93 mg/dL (ref 70–99)
Potassium: 4.6 mmol/L (ref 3.5–5.2)
Sodium: 140 mmol/L (ref 134–144)
Total Protein: 7.2 g/dL (ref 6.0–8.5)
eGFR: 99 mL/min/{1.73_m2} (ref >59)

## 2021-11-17 LAB — VITAMIN D 25 HYDROXY (VIT D DEFICIENCY, FRACTURES): Vit D, 25-Hydroxy: 51.9 ng/mL (ref 30.0–100.0)

## 2021-11-17 LAB — HEMOGLOBIN A1C
Est. average glucose Bld gHb Est-mCnc: 117 mg/dL
Hgb A1c MFr Bld: 5.7 % — ABNORMAL HIGH (ref 4.8–5.6)

## 2021-11-17 LAB — INSULIN, RANDOM: INSULIN: 17.3 u[IU]/mL (ref 2.6–24.9)

## 2021-11-17 NOTE — Progress Notes (Unsigned)
Chief Complaint:   OBESITY [name] is here to discuss her progress with her obesity treatment plan along with follow-up of her obesity related diagnoses. Hadli is on following a lower carbohydrate, vegetable and lean protein rich diet plan and states she is following her eating plan approximately 0% of the time. Tawny states she is not exercising.  Today's visit was #: 13 Starting weight: 233 lbs Starting date: 01/202/2020 Today's weight: 225 lbs Today's date: 11/16/2021 Total lbs lost to date: 8 lbs Total lbs lost since last in-office visit: +1 lb  Interim History: Her father experienced a syncope event this morning.  EMS called to the house.  Father is stable, did not require transport to the ED.  She never started [place] due to national drug shortage.    Subjective:   1. Vitamin D deficiency She is on OTC Vitamin D-3, 5,000 IU daily.  2. Pre-diabetes A1c ***  Assessment/Plan:   1. Vitamin D deficiency Check labs today.   - VITAMIN D 25 Hydroxy (Vit-D Deficiency, Fractures)  2. Pre-diabetes Check labs today.   - Hemoglobin A1c - Insulin, random - Comprehensive metabolic panel  3. Obesity, current BMI 37.6 Check IC at next office visit.  Pt is aware to arrive 30 minutes prior to appointment.   Start  - Semaglutide-Weight Management (WEGOVY) 0.25 MG/0.5ML SOAJ; Inject 0.25 mg into the skin once a week.  Dispense: 2 mL; Refill: 0  Zehava is currently in the action stage of change. As such, her goal is to continue with weight loss efforts. She has agreed to following a lower carbohydrate, vegetable and lean protein rich diet plan.   Exercise goals:  walk at least 2 times per week.   Behavioral modification strategies: increasing lean protein intake, decreasing simple carbohydrates, meal planning and cooking strategies, keeping healthy foods in the home, and planning for success.  Aniston has agreed to follow-up with our clinic in 4 weeks. She was informed of the  importance of frequent follow-up visits to maximize her success with intensive lifestyle modifications for her multiple health conditions.   Winta was informed we would discuss her lab results at her next visit unless there is a critical issue that needs to be addressed sooner. Mayukha agreed to keep her next visit at the agreed upon time to discuss these results.  Objective:   Blood pressure 121/85, pulse 84, temperature 98 F (36.7 C), height '5\' 5"'$  (1.651 m), weight 225 lb (102.1 kg), SpO2 97 %, unknown if currently breastfeeding. Body mass index is 37.44 kg/m.  General: Cooperative, alert, well developed, in no acute distress. HEENT: Conjunctivae and lids unremarkable. Cardiovascular: Regular rhythm.  Lungs: Normal work of breathing. Neurologic: No focal deficits.   Lab Results  Component Value Date   CREATININE 0.77 11/16/2021   BUN 12 11/16/2021   NA 140 11/16/2021   K 4.6 11/16/2021   CL 104 11/16/2021   CO2 20 11/16/2021   Lab Results  Component Value Date   ALT 12 11/16/2021   AST 16 11/16/2021   ALKPHOS 54 11/16/2021   BILITOT 0.5 11/16/2021   Lab Results  Component Value Date   HGBA1C 5.7 (H) 11/16/2021   HGBA1C 5.6 09/15/2021   HGBA1C 5.7 (H) 02/25/2021   HGBA1C 5.7 (H) 11/05/2020   HGBA1C 5.6 07/15/2020   Lab Results  Component Value Date   INSULIN 17.3 11/16/2021   INSULIN 15.0 09/15/2021   INSULIN 16.6 02/25/2021   INSULIN 20.4 11/05/2020   INSULIN 18.4  07/15/2020   Lab Results  Component Value Date   TSH 1.97 03/18/2020   Lab Results  Component Value Date   CHOL 140 07/15/2020   HDL 54 07/15/2020   LDLCALC 72 07/15/2020   TRIG 67 07/15/2020   CHOLHDL 2.6 07/15/2020   Lab Results  Component Value Date   VD25OH 51.9 11/16/2021   VD25OH 56.0 09/15/2021   VD25OH 74.5 02/25/2021   Lab Results  Component Value Date   WBC 5.0 05/16/2020   HGB 12.3 05/16/2020   HCT 37.7 05/16/2020   MCV 87.3 05/16/2020   PLT 406 (H) 05/16/2020   Lab  Results  Component Value Date   IRON 54 09/30/2017   TIBC 373 09/30/2017   FERRITIN 81 09/30/2017    Attestation Statements:   Reviewed by clinician on day of visit: allergies, medications, problem list, medical history, surgical history, family history, social history, and previous encounter notes.  I, Davy Pique, RMA, am acting as Location manager for Mina Marble, NP.  I have reviewed the above documentation for accuracy and completeness, and I agree with the above. -  ***

## 2021-11-24 ENCOUNTER — Telehealth: Payer: Self-pay | Admitting: Internal Medicine

## 2021-11-24 ENCOUNTER — Encounter (INDEPENDENT_AMBULATORY_CARE_PROVIDER_SITE_OTHER): Payer: Self-pay | Admitting: Adult Health

## 2021-11-24 ENCOUNTER — Encounter: Payer: Self-pay | Admitting: Internal Medicine

## 2021-11-24 NOTE — Telephone Encounter (Signed)
Called and let patient know what Dr Renold Genta had said about her possible needing to go out of work for [employer], she verbalized understanding. I also sent her a list of counselors via [place].

## 2021-12-22 ENCOUNTER — Ambulatory Visit (INDEPENDENT_AMBULATORY_CARE_PROVIDER_SITE_OTHER): Payer: BC Managed Care – PPO | Admitting: Family Medicine

## 2021-12-22 ENCOUNTER — Encounter (INDEPENDENT_AMBULATORY_CARE_PROVIDER_SITE_OTHER): Payer: Self-pay | Admitting: Family Medicine

## 2021-12-22 VITALS — BP 115/79 | HR 88 | Temp 98.9°F | Ht 65.0 in | Wt 223.0 lb

## 2021-12-22 DIAGNOSIS — E669 Obesity, unspecified: Secondary | ICD-10-CM

## 2021-12-22 DIAGNOSIS — Z6837 Body mass index (BMI) 37.0-37.9, adult: Secondary | ICD-10-CM | POA: Diagnosis not present

## 2021-12-22 DIAGNOSIS — R0602 Shortness of breath: Secondary | ICD-10-CM | POA: Diagnosis not present

## 2021-12-22 DIAGNOSIS — R7303 Prediabetes: Secondary | ICD-10-CM

## 2022-01-04 NOTE — Progress Notes (Signed)
Chief Complaint:   OBESITY [name] is here to discuss her progress with her obesity treatment plan along with follow-up of her obesity related diagnoses. [name] is on following a lower carbohydrate, vegetable and lean protein rich diet plan and states she is following her eating plan approximately 0% of the time. [name] states she is doing 0 minutes 0 times per week.  Today's visit was #: 92 Starting weight: 233 lbs Starting date: 02/06/2018 Today's weight: 223 lbs Today's date: 12/22/2021 Total lbs lost to date: 10 Total lbs lost since last in-office visit: 2  Interim History: [name] has done well with avoiding thanksgiving weight gain.  She has increased her water intake and she is avoiding leftovers.  Subjective:   1. SOBOE (shortness of breath on exertion) [name] notes no change in shortness of breath with exercise.  She is due to have her IC rechecked.  2. Prediabetes [name] is not on GLP-1 due to shortage.  She is working on decreasing simple carbohydrates.  Assessment/Plan:   1. SOBOE (shortness of breath on exertion) [name]'s VO2 and RMR have slightly decreased from her last visit but still at a good level.  She was encouraged to increase her exercise to help improve her symptoms.  2. Prediabetes [name] will continue with her diet, and may consider a different GLP-1 (Zepbound).  3. Obesity, Current BMI 37.2 [name] is currently in the action stage of change. As such, her goal is to continue with weight loss efforts. She has agreed to the Category 3 Plan.   [name] is to hold Wegovy until it is available at pharmacies.   Exercise goals: Core and lower strengthening exercises using a wobble cushion were demonstrated, and she is able to do them regularly.  Behavioral modification strategies: increasing lean protein intake and holiday eating strategies .  [name] has agreed to follow-up with our clinic in 4 weeks. She was informed of the importance of frequent follow-up  visits to maximize her success with intensive lifestyle modifications for her multiple health conditions.   Objective:   Blood pressure 115/79, pulse 88, temperature 98.9 F (37.2 C), height '5\' 5"'$  (1.651 m), weight 223 lb (101.2 kg), SpO2 98 %, unknown if currently breastfeeding. Body mass index is 37.11 kg/m.  General: Cooperative, alert, well developed, in no acute distress. HEENT: Conjunctivae and lids unremarkable. Cardiovascular: Regular rhythm.  Lungs: Normal work of breathing. Neurologic: No focal deficits.   Lab Results  Component Value Date   CREATININE 0.77 11/16/2021   BUN 12 11/16/2021   NA 140 11/16/2021   K 4.6 11/16/2021   CL 104 11/16/2021   CO2 20 11/16/2021   Lab Results  Component Value Date   ALT 12 11/16/2021   AST 16 11/16/2021   ALKPHOS 54 11/16/2021   BILITOT 0.5 11/16/2021   Lab Results  Component Value Date   HGBA1C 5.7 (H) 11/16/2021   HGBA1C 5.6 09/15/2021   HGBA1C 5.7 (H) 02/25/2021   HGBA1C 5.7 (H) 11/05/2020   HGBA1C 5.6 07/15/2020   Lab Results  Component Value Date   INSULIN 17.3 11/16/2021   INSULIN 15.0 09/15/2021   INSULIN 16.6 02/25/2021   INSULIN 20.4 11/05/2020   INSULIN 18.4 07/15/2020   Lab Results  Component Value Date   TSH 1.97 03/18/2020   Lab Results  Component Value Date   CHOL 140 07/15/2020   HDL 54 07/15/2020   LDLCALC 72 07/15/2020   TRIG 67 07/15/2020   CHOLHDL 2.6 07/15/2020   Lab Results  Component Value Date   VD25OH 51.9 11/16/2021   VD25OH 56.0 09/15/2021   VD25OH 74.5 02/25/2021   Lab Results  Component Value Date   WBC 5.0 05/16/2020   HGB 12.3 05/16/2020   HCT 37.7 05/16/2020   MCV 87.3 05/16/2020   PLT 406 (H) 05/16/2020   Lab Results  Component Value Date   IRON 54 09/30/2017   TIBC 373 09/30/2017   FERRITIN 81 09/30/2017   Attestation Statements:   Reviewed by clinician on day of visit: allergies, medications, problem list, medical history, surgical history, family history,  social history, and previous encounter notes.  Time spent on visit including pre-visit chart review and post-visit care and charting was 40 minutes.   I, Trixie Dredge, am acting as transcriptionist for Dennard Nip, MD.  I have reviewed the above documentation for accuracy and completeness, and I agree with the above. -  Dennard Nip, MD

## 2022-01-06 ENCOUNTER — Ambulatory Visit (INDEPENDENT_AMBULATORY_CARE_PROVIDER_SITE_OTHER): Payer: BC Managed Care – PPO | Admitting: Internal Medicine

## 2022-01-06 ENCOUNTER — Telehealth: Payer: Self-pay | Admitting: Internal Medicine

## 2022-01-06 DIAGNOSIS — J029 Acute pharyngitis, unspecified: Secondary | ICD-10-CM

## 2022-01-06 DIAGNOSIS — R059 Cough, unspecified: Secondary | ICD-10-CM

## 2022-01-06 LAB — POCT RAPID STREP A (OFFICE): Rapid Strep A Screen: NEGATIVE

## 2022-01-06 MED ORDER — AZITHROMYCIN 250 MG PO TABS: ORAL_TABLET | ORAL | 0 refills | Status: AC

## 2022-01-06 MED ORDER — BENZONATATE 100 MG PO CAPS: 100.0000 mg | ORAL_CAPSULE | Freq: Three times a day (TID) | ORAL | 0 refills | Status: DC | PRN

## 2022-01-06 MED ORDER — FLUCONAZOLE 150 MG PO TABS: 150.0000 mg | ORAL_TABLET | Freq: Once | ORAL | 0 refills | Status: AC

## 2022-01-06 NOTE — Telephone Encounter (Signed)
[name] [phone]  [name] called to say she has scratchy throat, cough, headache, nasal congestion and her chest feels funny, hard to explain. She has not done a COVID test. She said her son had the croup last week. Scheduled for car visit at 12:00

## 2022-01-12 ENCOUNTER — Encounter: Payer: Self-pay | Admitting: Internal Medicine

## 2022-01-12 NOTE — Progress Notes (Signed)
   Subjective:    Patient ID: [name], female    DOB: 1980-09-09, 41 y.o.   MRN: [id number]  HPI 41 year old Female seen today acutely with a cough, scratchy throat, headache and nasal congestion.  She says her chest feels a bit funny but she has no frank chest pain.  She said her mother has been ill and son had croup last week.  She has a history of impaired glucose tolerance.  She is seen at [facility].  She has a history of depression treated with Wellbutrin and Prozac.  She called recently regarding time off work.  I think she needs to be seen by Psychologist for evaluation before we can recommend that as she is not seen here all that often.  I think she would benefit from medication consultation from Psychologist/Psychiatrist.    Review of Systems see above     Objective:   Physical Exam Her temp was taken by ear thermometer and she was afebrile today.  TMs are slightly full bilaterally but not red.  Pharynx slightly injected.  Rapid strep screen is negative.  Her chest is clear.  She looks a bit fatigued.       Assessment & Plan:  Acute nonstrep pharyngitis  Plan: Tessalon Perles 100 mg up to 3 times daily as needed for cough.  Zithromax Z-PAK 2 tabs day 1 followed by 1 tab days 2 through 5.

## 2022-01-12 NOTE — Patient Instructions (Signed)
Take Zithromax Z-PAK 2 tabs day 1 followed by 1 tab days 2 through 5.  Tessalon Perles 100 mg up to 3 times daily as needed for cough.  Rest and stay well-hydrated.

## 2022-01-27 ENCOUNTER — Encounter (INDEPENDENT_AMBULATORY_CARE_PROVIDER_SITE_OTHER): Payer: Self-pay | Admitting: Family Medicine

## 2022-01-27 ENCOUNTER — Ambulatory Visit (INDEPENDENT_AMBULATORY_CARE_PROVIDER_SITE_OTHER): Payer: BC Managed Care – PPO | Admitting: Family Medicine

## 2022-01-27 ENCOUNTER — Other Ambulatory Visit (HOSPITAL_COMMUNITY): Payer: Self-pay

## 2022-01-27 VITALS — BP 138/86 | HR 82 | Temp 98.0°F | Ht 65.0 in | Wt 227.0 lb

## 2022-01-27 DIAGNOSIS — Z6837 Body mass index (BMI) 37.0-37.9, adult: Secondary | ICD-10-CM

## 2022-01-27 DIAGNOSIS — E669 Obesity, unspecified: Secondary | ICD-10-CM | POA: Diagnosis not present

## 2022-01-27 DIAGNOSIS — R632 Polyphagia: Secondary | ICD-10-CM | POA: Diagnosis not present

## 2022-01-27 DIAGNOSIS — E559 Vitamin D deficiency, unspecified: Secondary | ICD-10-CM | POA: Diagnosis not present

## 2022-01-27 MED ORDER — WEGOVY 0.25 MG/0.5ML ~~LOC~~ SOAJ
0.2500 mg | SUBCUTANEOUS | 0 refills | Status: DC
  Filled 2022-01-27: qty 2, 28d supply, fill #0

## 2022-01-28 ENCOUNTER — Other Ambulatory Visit (INDEPENDENT_AMBULATORY_CARE_PROVIDER_SITE_OTHER): Payer: Self-pay | Admitting: *Deleted

## 2022-01-28 ENCOUNTER — Other Ambulatory Visit (HOSPITAL_COMMUNITY): Payer: Self-pay

## 2022-01-28 ENCOUNTER — Telehealth (INDEPENDENT_AMBULATORY_CARE_PROVIDER_SITE_OTHER): Payer: Self-pay | Admitting: *Deleted

## 2022-01-28 ENCOUNTER — Encounter (INDEPENDENT_AMBULATORY_CARE_PROVIDER_SITE_OTHER): Payer: Self-pay | Admitting: Family Medicine

## 2022-01-28 MED ORDER — ZEPBOUND 2.5 MG/0.5ML ~~LOC~~ SOAJ
2.5000 mg | SUBCUTANEOUS | 0 refills | Status: DC
  Filled 2022-01-28 – 2022-01-29 (×2): qty 2, 28d supply, fill #0

## 2022-01-28 NOTE — Telephone Encounter (Signed)
Prior authorization done Via covermymeds for patients Zepbound. Waiting on determination.  AYALA RIBBLE (Key: [id number]) PA Case ID #: [id number] Rx #: [id number]

## 2022-01-29 ENCOUNTER — Other Ambulatory Visit: Payer: Self-pay

## 2022-01-29 ENCOUNTER — Other Ambulatory Visit (HOSPITAL_COMMUNITY): Payer: Self-pay

## 2022-01-30 ENCOUNTER — Other Ambulatory Visit (HOSPITAL_COMMUNITY): Payer: Self-pay

## 2022-02-08 NOTE — Progress Notes (Unsigned)
Chief Complaint:   OBESITY Chariah is here to discuss her progress with her obesity treatment plan along with follow-up of her obesity related diagnoses. Mikisha is on the Category 3 Plan and states she is following her eating plan approximately 0% of the time. Ravan states she is doing 0 minutes 0 times per week.  Today's visit was #: 82 Starting weight: 233 lbs Starting date: 02/06/2018 Today's weight: 227 lbs Today's date: 01/27/2022 Total lbs lost to date: 6 Total lbs lost since last in-office visit: 0  Interim History: Chelsie did some celebrations eating over this holidays and she is trying to get back on track. She is missing breakfast and her protein is decreasing. She was unable to get [facility] in the past due to shortages.   Subjective:   1. Vitamin D deficiency Caroleena is on Vitamin D OTC, with no side effects noted.   2. Polyphagia Mairin notes increased hunger, which is likely due to increased simple carbohydrates and decreased protein.   Assessment/Plan:   1. Vitamin D deficiency Freja will continue OTC Vitamin D 5,000 IU daily, and we will recheck labs in 2 months.   2. Polyphagia Michole was educated on the relationship of polyphagia and nutrition, and she will work on getting back to her Category 3 eating plan.   3. Obesity, current BMI 37.8 Nysa will continue Wegovy 0.25 mg once weekly, and we will refill for 1 month. Prescription [facility] to [facility].   Aigner is currently in the action stage of change. As such, her goal is to continue with weight loss efforts. She has agreed to the Category 3 Plan and keeping a food journal and adhering to recommended goals of 300-400 calories and 30+ grams of protein at breakfast daily.   Breakfast ideas and recipes were discussed in depth today.   Behavioral modification strategies: increasing lean protein intake and no skipping meals.  Shekela has agreed to follow-up with our clinic in 4 weeks. She was  informed of the importance of frequent follow-up visits to maximize her success with intensive lifestyle modifications for her multiple health conditions.   Objective:   Blood pressure 138/86, pulse 82, temperature 98 F (36.7 C), height '5\' 5"'$  (1.651 m), weight 227 lb (103 kg), SpO2 98 %, unknown if currently breastfeeding. Body mass index is 37.77 kg/m.  General: Cooperative, alert, well developed, in no acute distress. HEENT: Conjunctivae and lids unremarkable. Cardiovascular: Regular rhythm.  Lungs: Normal work of breathing. Neurologic: No focal deficits.   Lab Results  Component Value Date   CREATININE 0.77 11/16/2021   BUN 12 11/16/2021   NA 140 11/16/2021   K 4.6 11/16/2021   CL 104 11/16/2021   CO2 20 11/16/2021   Lab Results  Component Value Date   ALT 12 11/16/2021   AST 16 11/16/2021   ALKPHOS 54 11/16/2021   BILITOT 0.5 11/16/2021   Lab Results  Component Value Date   HGBA1C 5.7 (H) 11/16/2021   HGBA1C 5.6 09/15/2021   HGBA1C 5.7 (H) 02/25/2021   HGBA1C 5.7 (H) 11/05/2020   HGBA1C 5.6 07/15/2020   Lab Results  Component Value Date   INSULIN 17.3 11/16/2021   INSULIN 15.0 09/15/2021   INSULIN 16.6 02/25/2021   INSULIN 20.4 11/05/2020   INSULIN 18.4 07/15/2020   Lab Results  Component Value Date   TSH 1.97 03/18/2020   Lab Results  Component Value Date   CHOL 140 07/15/2020   HDL 54 07/15/2020  LDLCALC 72 07/15/2020   TRIG 67 07/15/2020   CHOLHDL 2.6 07/15/2020   Lab Results  Component Value Date   VD25OH 51.9 11/16/2021   VD25OH 56.0 09/15/2021   VD25OH 74.5 02/25/2021   Lab Results  Component Value Date   WBC 5.0 05/16/2020   HGB 12.3 05/16/2020   HCT 37.7 05/16/2020   MCV 87.3 05/16/2020   PLT 406 (H) 05/16/2020   Lab Results  Component Value Date   IRON 54 09/30/2017   TIBC 373 09/30/2017   FERRITIN 81 09/30/2017   Attestation Statements:   Reviewed by clinician on day of visit: allergies, medications, problem list,  medical history, surgical history, family history, social history, and previous encounter notes.  Time spent on visit including pre-visit chart review and post-visit care and charting was 32 minutes.   I, Trixie Dredge, am acting as transcriptionist for Dennard Nip, MD.  I have reviewed the above documentation for accuracy and completeness, and I agree with the above. -  Dennard Nip, MD

## 2022-02-12 ENCOUNTER — Other Ambulatory Visit: Payer: Self-pay | Admitting: Obstetrics and Gynecology

## 2022-02-12 DIAGNOSIS — Z1231 Encounter for screening mammogram for malignant neoplasm of breast: Secondary | ICD-10-CM

## 2022-02-24 ENCOUNTER — Ambulatory Visit (INDEPENDENT_AMBULATORY_CARE_PROVIDER_SITE_OTHER): Payer: BC Managed Care – PPO | Admitting: Family Medicine

## 2022-02-24 ENCOUNTER — Other Ambulatory Visit (HOSPITAL_COMMUNITY): Payer: Self-pay

## 2022-02-24 VITALS — BP 127/82 | HR 84 | Temp 98.2°F | Ht 65.0 in | Wt 221.0 lb

## 2022-02-24 DIAGNOSIS — R632 Polyphagia: Secondary | ICD-10-CM | POA: Diagnosis not present

## 2022-02-24 DIAGNOSIS — E669 Obesity, unspecified: Secondary | ICD-10-CM

## 2022-02-24 DIAGNOSIS — Z6836 Body mass index (BMI) 36.0-36.9, adult: Secondary | ICD-10-CM

## 2022-02-24 DIAGNOSIS — Z6835 Body mass index (BMI) 35.0-35.9, adult: Secondary | ICD-10-CM | POA: Insufficient documentation

## 2022-02-24 MED ORDER — ZEPBOUND 2.5 MG/0.5ML ~~LOC~~ SOAJ
2.5000 mg | SUBCUTANEOUS | 0 refills | Status: DC
  Filled 2022-02-24: qty 2, 28d supply, fill #0

## 2022-02-25 ENCOUNTER — Other Ambulatory Visit (HOSPITAL_COMMUNITY): Payer: Self-pay

## 2022-03-01 ENCOUNTER — Other Ambulatory Visit (HOSPITAL_COMMUNITY): Payer: Self-pay

## 2022-03-10 NOTE — Progress Notes (Signed)
Chief Complaint:   OBESITY [name] is here to discuss her progress with her obesity treatment plan along with follow-up of her obesity related diagnoses. [name] is on the Category 3 Plan and keeping a food journal and adhering to recommended goals of 300-400 calories and 30+ grams of protein at breakfast and states she is following her eating plan approximately 80% of the time. [name] states she is doing 0 minutes 0 times per week.  Today's visit was #: 62 Starting weight: 233 lbs Starting date: 02/06/2018 Today's weight: 221 lbs Today's date: 02/24/2022 Total lbs lost to date: 12 Total lbs lost since last in-office visit: 6  Interim History: [name] has done well with weight loss. Her hunger is mostly controlled and she is doing better with meeting her protein goals.   Subjective:   1. Polyphagia [name] notes decreased polyphagia on Zepbound. She denies nausea or vomiting. She is trying to decrease simple carbohydrates to help with symptoms.   Assessment/Plan:   1. Polyphagia [name] will continue Zepbound 2.5 mg once weekly, and we will refill for 1 month.   - tirzepatide (ZEPBOUND) 2.5 MG/0.5ML Pen; Inject 2.5 mg into the skin once a week.  Dispense: 2 mL; Refill: 0  2. BMI 36.0-36.9,adult  3. Obesity, Beginning BMI 38.77 [name] is currently in the action stage of change. As such, her goal is to continue with weight loss efforts. She has agreed to the Category 3 Plan and keeping a food journal and adhering to recommended goals of 300-400 calories and 30+ grams of protein at breakfast daily.   We will recheck fasting labs at her next visit.   Exercise goals: [name] videos.   Behavioral modification strategies: increasing lean protein intake.  [name] has agreed to follow-up with our clinic in 3 to 4 weeks. She was informed of the importance of frequent follow-up visits to maximize her success with intensive lifestyle modifications for her multiple health conditions.    Objective:   Blood pressure 127/82, pulse 84, temperature 98.2 F (36.8 C), height 5' 5"$  (1.651 m), weight 221 lb (100.2 kg), SpO2 96 %, unknown if currently breastfeeding. Body mass index is 36.78 kg/m.  General: Cooperative, alert, well developed, in no acute distress. HEENT: Conjunctivae and lids unremarkable. Cardiovascular: Regular rhythm.  Lungs: Normal work of breathing. Neurologic: No focal deficits.   Lab Results  Component Value Date   CREATININE 0.77 11/16/2021   BUN 12 11/16/2021   NA 140 11/16/2021   K 4.6 11/16/2021   CL 104 11/16/2021   CO2 20 11/16/2021   Lab Results  Component Value Date   ALT 12 11/16/2021   AST 16 11/16/2021   ALKPHOS 54 11/16/2021   BILITOT 0.5 11/16/2021   Lab Results  Component Value Date   HGBA1C 5.7 (H) 11/16/2021   HGBA1C 5.6 09/15/2021   HGBA1C 5.7 (H) 02/25/2021   HGBA1C 5.7 (H) 11/05/2020   HGBA1C 5.6 07/15/2020   Lab Results  Component Value Date   INSULIN 17.3 11/16/2021   INSULIN 15.0 09/15/2021   INSULIN 16.6 02/25/2021   INSULIN 20.4 11/05/2020   INSULIN 18.4 07/15/2020   Lab Results  Component Value Date   TSH 1.97 03/18/2020   Lab Results  Component Value Date   CHOL 140 07/15/2020   HDL 54 07/15/2020   LDLCALC 72 07/15/2020   TRIG 67 07/15/2020   CHOLHDL 2.6 07/15/2020   Lab Results  Component Value Date   VD25OH 51.9 11/16/2021   VD25OH 56.0  09/15/2021   VD25OH 74.5 02/25/2021   Lab Results  Component Value Date   WBC 5.0 05/16/2020   HGB 12.3 05/16/2020   HCT 37.7 05/16/2020   MCV 87.3 05/16/2020   PLT 406 (H) 05/16/2020   Lab Results  Component Value Date   IRON 54 09/30/2017   TIBC 373 09/30/2017   FERRITIN 81 09/30/2017   Attestation Statements:   Reviewed by clinician on day of visit: allergies, medications, problem list, medical history, surgical history, family history, social history, and previous encounter notes.   [name], Trixie Dredge, am acting as transcriptionist for  Dennard Nip, MD.  [name] have reviewed the above documentation for accuracy and completeness, and [name] agree with the above. -  Dennard Nip, MD

## 2022-03-25 ENCOUNTER — Other Ambulatory Visit (HOSPITAL_COMMUNITY): Payer: Self-pay

## 2022-03-25 ENCOUNTER — Ambulatory Visit (INDEPENDENT_AMBULATORY_CARE_PROVIDER_SITE_OTHER): Payer: BC Managed Care – PPO | Admitting: Family Medicine

## 2022-03-25 ENCOUNTER — Other Ambulatory Visit: Payer: Self-pay

## 2022-03-25 ENCOUNTER — Encounter (INDEPENDENT_AMBULATORY_CARE_PROVIDER_SITE_OTHER): Payer: Self-pay | Admitting: Family Medicine

## 2022-03-25 VITALS — BP 113/59 | HR 79 | Temp 98.0°F | Ht 65.0 in | Wt 219.0 lb

## 2022-03-25 DIAGNOSIS — E559 Vitamin D deficiency, unspecified: Secondary | ICD-10-CM

## 2022-03-25 DIAGNOSIS — R7303 Prediabetes: Secondary | ICD-10-CM | POA: Diagnosis not present

## 2022-03-25 DIAGNOSIS — E669 Obesity, unspecified: Secondary | ICD-10-CM | POA: Diagnosis not present

## 2022-03-25 DIAGNOSIS — R632 Polyphagia: Secondary | ICD-10-CM | POA: Diagnosis not present

## 2022-03-25 DIAGNOSIS — Z6836 Body mass index (BMI) 36.0-36.9, adult: Secondary | ICD-10-CM

## 2022-03-25 MED ORDER — ZEPBOUND 2.5 MG/0.5ML ~~LOC~~ SOAJ
2.5000 mg | SUBCUTANEOUS | 0 refills | Status: DC
  Filled 2022-03-25 – 2022-04-07 (×3): qty 2, 28d supply, fill #0

## 2022-03-26 LAB — CMP14+EGFR
ALT: 12 IU/L (ref 0–32)
AST: 14 IU/L (ref 0–40)
Albumin/Globulin Ratio: 1.6 (ref 1.2–2.2)
Albumin: 4.5 g/dL (ref 3.9–4.9)
Alkaline Phosphatase: 66 IU/L (ref 44–121)
BUN/Creatinine Ratio: 16 (ref 9–23)
BUN: 12 mg/dL (ref 6–24)
Bilirubin Total: 0.3 mg/dL (ref 0.0–1.2)
CO2: 22 mmol/L (ref 20–29)
Calcium: 9.8 mg/dL (ref 8.7–10.2)
Chloride: 106 mmol/L (ref 96–106)
Creatinine, Ser: 0.77 mg/dL (ref 0.57–1.00)
Globulin, Total: 2.9 g/dL (ref 1.5–4.5)
Glucose: 90 mg/dL (ref 70–99)
Potassium: 5.1 mmol/L (ref 3.5–5.2)
Sodium: 140 mmol/L (ref 134–144)
Total Protein: 7.4 g/dL (ref 6.0–8.5)
eGFR: 99 mL/min/{1.73_m2} (ref >59)

## 2022-03-26 LAB — CBC WITH DIFFERENTIAL/PLATELET
Basophils Absolute: 0.1 10*3/uL (ref 0.0–0.2)
Basos: 1 %
EOS (ABSOLUTE): 0.1 10*3/uL (ref 0.0–0.4)
Eos: 2 %
Hematocrit: 39.4 % (ref 34.0–46.6)
Hemoglobin: 12.5 g/dL (ref 11.1–15.9)
Immature Grans (Abs): 0 10*3/uL (ref 0.0–0.1)
Immature Granulocytes: 0 %
Lymphocytes Absolute: 1.6 10*3/uL (ref 0.7–3.1)
Lymphs: 29 %
MCH: 28.2 pg (ref 26.6–33.0)
MCHC: 31.7 g/dL (ref 31.5–35.7)
MCV: 89 fL (ref 79–97)
Monocytes Absolute: 0.4 10*3/uL (ref 0.1–0.9)
Monocytes: 8 %
Neutrophils Absolute: 3.1 10*3/uL (ref 1.4–7.0)
Neutrophils: 60 %
Platelets: 412 10*3/uL (ref 150–450)
RBC: 4.44 x10E6/uL (ref 3.77–5.28)
RDW: 12.8 % (ref 11.7–15.4)
WBC: 5.3 10*3/uL (ref 3.4–10.8)

## 2022-03-26 LAB — HEMOGLOBIN A1C
Est. average glucose Bld gHb Est-mCnc: 114 mg/dL
Hgb A1c MFr Bld: 5.6 % (ref 4.8–5.6)

## 2022-03-26 LAB — LIPID PANEL WITH LDL/HDL RATIO
Cholesterol, Total: 158 mg/dL (ref 100–199)
HDL: 48 mg/dL (ref >39)
LDL Chol Calc (NIH): 99 mg/dL (ref 0–99)
LDL/HDL Ratio: 2.1 ratio (ref 0.0–3.2)
Triglycerides: 52 mg/dL (ref 0–149)
VLDL Cholesterol Cal: 11 mg/dL (ref 5–40)

## 2022-03-26 LAB — TSH: TSH: 1.42 u[IU]/mL (ref 0.450–4.500)

## 2022-03-26 LAB — INSULIN, RANDOM: INSULIN: 13.7 u[IU]/mL (ref 2.6–24.9)

## 2022-03-26 LAB — VITAMIN D 25 HYDROXY (VIT D DEFICIENCY, FRACTURES): Vit D, 25-Hydroxy: 50.7 ng/mL (ref 30.0–100.0)

## 2022-04-02 ENCOUNTER — Other Ambulatory Visit (HOSPITAL_COMMUNITY): Payer: Self-pay

## 2022-04-05 ENCOUNTER — Ambulatory Visit
Admission: RE | Admit: 2022-04-05 | Discharge: 2022-04-05 | Disposition: A | Discharge status: Home / Self Care | Payer: 59 | Source: Ambulatory Visit | Attending: Obstetrics and Gynecology | Admitting: Obstetrics and Gynecology

## 2022-04-05 DIAGNOSIS — Z1231 Encounter for screening mammogram for malignant neoplasm of breast: Secondary | ICD-10-CM

## 2022-04-06 NOTE — Progress Notes (Unsigned)
Chief Complaint:   OBESITY [name] is here to discuss her progress with her obesity treatment plan along with follow-up of her obesity related diagnoses. [name] is on {MWMwtlossportion/plan2:23431} and states she is following her eating plan approximately ***% of the time. [name] states she is *** *** minutes *** times per week.  Today's visit was #: *** Starting weight: *** Starting date: *** Today's weight: *** Today's date: 03/25/2022 Total lbs lost to date: *** Total lbs lost since last in-office visit: ***  Interim History: ***  Subjective:   1. Polyphagia ***  2. Vitamin D deficiency ***  3. Prediabetes ***  Assessment/Plan:   1. Polyphagia *** - tirzepatide (ZEPBOUND) 2.5 MG/0.5ML Pen; Inject 2.5 mg into the skin once a week.  Dispense: 2 mL; Refill: 0  2. Vitamin D deficiency *** - VITAMIN D 25 Hydroxy (Vit-D Deficiency, Fractures)  3. Prediabetes *** - CBC with Differential/Platelet - CMP14+EGFR - Insulin, random - Hemoglobin A1c - Lipid Panel With LDL/HDL Ratio - TSH  4. BMI 36.0-36.9,adult  5. Obesity, Beginning BMI 38.77 [name] {CHL AMB IS/IS NOT:210130109} currently in the action stage of change. As such, her goal is to {MWMwtloss#1:210800005}. She has agreed to {MWMwtlossportion/plan2:23431}.   Exercise goals: {MWM EXERCISE RECS:23473}  Behavioral modification strategies: {MWMwtlossdietstrategies3:23432}.  [name] has agreed to follow-up with our clinic in {NUMBER 1-10:22536} weeks. She was informed of the importance of frequent follow-up visits to maximize her success with intensive lifestyle modifications for her multiple health conditions.   [name] was informed we would discuss her lab results at her next visit unless there is a critical issue that needs to be addressed sooner. [name] agreed to keep her next visit at the agreed upon time to discuss these results.  Objective:   Blood pressure (!) 113/59, pulse 79, temperature 98 F (36.7  C), height 5\' 5"  (1.651 m), weight 219 lb (99.3 kg), SpO2 98 %, unknown if currently breastfeeding. Body mass index is 36.44 kg/m.  Lab Results  Component Value Date   CREATININE 0.77 03/25/2022   BUN 12 03/25/2022   NA 140 03/25/2022   K 5.1 03/25/2022   CL 106 03/25/2022   CO2 22 03/25/2022   Lab Results  Component Value Date   ALT 12 03/25/2022   AST 14 03/25/2022   ALKPHOS 66 03/25/2022   BILITOT 0.3 03/25/2022   Lab Results  Component Value Date   HGBA1C 5.6 03/25/2022   HGBA1C 5.7 (H) 11/16/2021   HGBA1C 5.6 09/15/2021   HGBA1C 5.7 (H) 02/25/2021   HGBA1C 5.7 (H) 11/05/2020   Lab Results  Component Value Date   INSULIN 13.7 03/25/2022   INSULIN 17.3 11/16/2021   INSULIN 15.0 09/15/2021   INSULIN 16.6 02/25/2021   INSULIN 20.4 11/05/2020   Lab Results  Component Value Date   TSH 1.420 03/25/2022   Lab Results  Component Value Date   CHOL 158 03/25/2022   HDL 48 03/25/2022   LDLCALC 99 03/25/2022   TRIG 52 03/25/2022   CHOLHDL 2.6 07/15/2020   Lab Results  Component Value Date   VD25OH 50.7 03/25/2022   VD25OH 51.9 11/16/2021   VD25OH 56.0 09/15/2021   Lab Results  Component Value Date   WBC 5.3 03/25/2022   HGB 12.5 03/25/2022   HCT 39.4 03/25/2022   MCV 89 03/25/2022   PLT 412 03/25/2022   Lab Results  Component Value Date   IRON 54 09/30/2017   TIBC 373 09/30/2017   FERRITIN 81 09/30/2017  Attestation Statements:   Reviewed by clinician on day of visit: allergies, medications, problem list, medical history, surgical history, family history, social history, and previous encounter notes.   I, Trixie Dredge, am acting as transcriptionist for Dennard Nip, MD.  I have reviewed the above documentation for accuracy and completeness, and I agree with the above. -  ***

## 2022-04-07 ENCOUNTER — Other Ambulatory Visit (HOSPITAL_COMMUNITY): Payer: Self-pay

## 2022-04-10 ENCOUNTER — Other Ambulatory Visit (HOSPITAL_COMMUNITY): Payer: Self-pay

## 2022-04-13 ENCOUNTER — Telehealth: Payer: Self-pay

## 2022-04-13 NOTE — Telephone Encounter (Signed)
Left patient a voicemail asking what Rx needs to be renewed and also informed patient No referral is need to see psychiatrist.

## 2022-04-22 ENCOUNTER — Ambulatory Visit (INDEPENDENT_AMBULATORY_CARE_PROVIDER_SITE_OTHER): Payer: BC Managed Care – PPO | Admitting: Internal Medicine

## 2022-04-22 ENCOUNTER — Telehealth: Payer: Self-pay

## 2022-04-22 ENCOUNTER — Encounter: Payer: Self-pay | Admitting: Internal Medicine

## 2022-04-22 VITALS — BP 124/76 | HR 92 | Temp 97.8°F

## 2022-04-22 DIAGNOSIS — J069 Acute upper respiratory infection, unspecified: Secondary | ICD-10-CM | POA: Diagnosis not present

## 2022-04-22 MED ORDER — FLUCONAZOLE 150 MG PO TABS: 150.0000 mg | ORAL_TABLET | Freq: Once | ORAL | 0 refills | Status: AC

## 2022-04-22 MED ORDER — HYDROCODONE BIT-HOMATROP MBR 5-1.5 MG/5ML PO SOLN: 5.0000 mL | Freq: Three times a day (TID) | ORAL | 0 refills | Status: DC | PRN

## 2022-04-22 MED ORDER — AZITHROMYCIN 250 MG PO TABS: ORAL_TABLET | ORAL | 0 refills | Status: AC

## 2022-04-22 NOTE — Patient Instructions (Signed)
Hycodan one tsp every 8 hours as needed for cough and sore throat pain. Take Zithromax Z-pak as directed 2 tabs day 1 followed by one tab days 2-5. Diflucan if needed for Candida vaginitis while on antibiotics. Note given for work.

## 2022-04-22 NOTE — Telephone Encounter (Signed)
Scheduled

## 2022-04-22 NOTE — Telephone Encounter (Signed)
Patient called stating she has been coughing, tightness in her chest and headache that clears up with taking Zyrtec.  Her symptoms started on Tuesday and she believes it could be related to allergies.  Patient would like to be seen today.

## 2022-04-22 NOTE — Progress Notes (Signed)
Patient Care Team: Elby Showers, MD as PCP - General (Internal Medicine) [facility] Dials.Theda Sers as Building services engineer Health)  Visit Date: 04/22/22  Subjective:    Patient ID: [name] , Female   DOB: Apr 24, 1980, 42 y.o.    MRN: [id number]   42 y.o. Female presents today for cough with yellow sputum, frontal headache, chest tightness, fatigue since 04/20/22. Denies sore throat, pain when swallowing. Taking Zyrtec, extra strength Tylenol with inadequate relief.  Past Medical History:  Diagnosis Date   Class 2 obesity due to excess calories with body mass index (BMI) of 35.0 to 35.9 in adult 02/08/2018   GERD (gastroesophageal reflux disease)    not currently   Gestational diabetes mellitus (GDM), antepartum    Heart murmur    HSV infection    Leaky heart valve    Prediabetes    Vitamin D deficiency      Family History  Problem Relation Age of Onset   Hypertension Mother    Sleep apnea Mother    Obesity Mother    Diabetes Father    High Cholesterol Father    Cancer Father    Hypertension Brother    Diabetes Brother     Social History   Social History Narrative   Not on file      Review of Systems  Constitutional:  Negative for fever and malaise/fatigue.  HENT:  Negative for congestion and sore throat.   Eyes:  Negative for blurred vision.  Respiratory:  Positive for cough and sputum production (Yellow). Negative for shortness of breath.   Cardiovascular:  Negative for chest pain, palpitations and leg swelling.  Gastrointestinal:  Negative for vomiting.  Musculoskeletal:  Negative for back pain.  Skin:  Negative for rash.  Neurological:  Positive for headaches (Frontal). Negative for loss of consciousness.        Objective:   Vitals: BP 124/76   Pulse 92   Temp 97.8 F (36.6 C) (Tympanic)   SpO2 99%    Physical Exam Vitals and nursing note reviewed.  Constitutional:      General: She is not in acute distress.    Appearance: Normal  appearance. She is not toxic-appearing.  HENT:     Head: Normocephalic and atraumatic.     Right Ear: Hearing and external ear normal. There is impacted cerumen.     Left Ear: Hearing, ear canal and external ear normal.     Ears:     Comments: Left TM dull with splayed light reflex. Right TM obscured by cerumen in canal.    Mouth/Throat:     Pharynx: No oropharyngeal exudate.  Pulmonary:     Effort: Pulmonary effort is normal. No respiratory distress.     Breath sounds: Normal breath sounds. No wheezing or rales.  Skin:    General: Skin is warm and dry.  Neurological:     Mental Status: She is alert and oriented to person, place, and time. Mental status is at baseline.  Psychiatric:        Mood and Affect: Mood normal.        Behavior: Behavior normal.        Thought Content: Thought content normal.        Judgment: Judgment normal.       Results:   Studies obtained and personally reviewed by me:   Labs:       Component Value Date/Time   NA 140 03/25/2022 0917   K 5.1 03/25/2022 0917  CL 106 03/25/2022 0917   CO2 22 03/25/2022 0917   GLUCOSE 90 03/25/2022 0917   GLUCOSE 88 03/18/2020 1636   BUN 12 03/25/2022 0917   CREATININE 0.77 03/25/2022 0917   CREATININE 0.73 03/18/2020 1636   CALCIUM 9.8 03/25/2022 0917   PROT 7.4 03/25/2022 0917   ALBUMIN 4.5 03/25/2022 0917   AST 14 03/25/2022 0917   ALT 12 03/25/2022 0917   ALKPHOS 66 03/25/2022 0917   BILITOT 0.3 03/25/2022 0917   GFRNONAA 103 03/18/2020 1636   GFRAA 119 03/18/2020 1636     Lab Results  Component Value Date   WBC 5.3 03/25/2022   HGB 12.5 03/25/2022   HCT 39.4 03/25/2022   MCV 89 03/25/2022   PLT 412 03/25/2022    Lab Results  Component Value Date   CHOL 158 03/25/2022   HDL 48 03/25/2022   LDLCALC 99 03/25/2022   TRIG 52 03/25/2022   CHOLHDL 2.6 07/15/2020    Lab Results  Component Value Date   HGBA1C 5.6 03/25/2022     Lab Results  Component Value Date   TSH 1.420 03/25/2022       Assessment & Plan:   Acute upper respiratory infection: Prescribed Hycodan 5 mL every 8 hours as needed for cough, Diflucan 150 mg once if needed for Candida vaginitis, azithromycin Z-Pak twice on day 1 followed by once days 2-5.  Do not think she has pneumonia.      I,Alexander Ruley,acting as a Neurosurgeon for Margaree Mackintosh, MD.,have documented all relevant documentation on the behalf of Margaree Mackintosh, MD,as directed by  Margaree Mackintosh, MD while in the presence of Margaree Mackintosh, MD.   I, Margaree Mackintosh, MD, have reviewed all documentation for this visit. The documentation on 05/02/22 for the exam, diagnosis, procedures, and orders are all accurate and complete.

## 2022-04-28 ENCOUNTER — Encounter (INDEPENDENT_AMBULATORY_CARE_PROVIDER_SITE_OTHER): Payer: Self-pay | Admitting: Family Medicine

## 2022-04-28 ENCOUNTER — Other Ambulatory Visit (HOSPITAL_COMMUNITY): Payer: Self-pay

## 2022-04-28 ENCOUNTER — Ambulatory Visit (INDEPENDENT_AMBULATORY_CARE_PROVIDER_SITE_OTHER): Payer: BC Managed Care – PPO | Admitting: Family Medicine

## 2022-04-28 VITALS — BP 120/79 | HR 83 | Temp 97.9°F | Ht 65.0 in | Wt 216.0 lb

## 2022-04-28 DIAGNOSIS — Z6836 Body mass index (BMI) 36.0-36.9, adult: Secondary | ICD-10-CM

## 2022-04-28 DIAGNOSIS — R632 Polyphagia: Secondary | ICD-10-CM

## 2022-04-28 DIAGNOSIS — E669 Obesity, unspecified: Secondary | ICD-10-CM

## 2022-04-28 MED ORDER — ZEPBOUND 2.5 MG/0.5ML ~~LOC~~ SOAJ
2.5000 mg | SUBCUTANEOUS | 0 refills | Status: DC
Start: 2022-04-28 — End: 2022-06-01
  Filled 2022-04-28 – 2022-05-20 (×2): qty 2, 28d supply, fill #0

## 2022-04-28 NOTE — Progress Notes (Unsigned)
Chief Complaint:   OBESITY [name] is here to discuss her progress with her obesity treatment plan along with follow-up of her obesity related diagnoses. [name] is on the Category 3 Plan and states she is following her eating plan approximately 75% of the time. [name] states she is walking for 30 minutes 2-3 times per week.  Today's visit was #: 58 Starting weight: 233 lbs Starting date: 02/06/2018 Today's weight: 216 lbs Today's date: 04/28/2022 Total lbs lost to date: 17 Total lbs lost since last in-office visit: 3  Interim History: [name] continues to do well with her weight loss.  Her hunger is appropriate and controlled.  She is working on increasing her exercise and meal planning.  Subjective:   1. Polyphagia [name] started Zepbound and she notes her polyphagia has improved.  No nausea or vomiting was noted.  Assessment/Plan:   1. Polyphagia [name] will continue Zepbound 2.5 mg once weekly, and we will refill for 1 month.   - tirzepatide (ZEPBOUND) 2.5 MG/0.5ML Pen; Inject 2.5 mg into the skin once a week.  Dispense: 2 mL; Refill: 0  2. BMI 36.0-36.9,adult  3. Obesity, Beginning BMI 38.77 [name] is currently in the action stage of change. As such, her goal is to continue with weight loss efforts. She has agreed to the Category 3 Plan.   Exercise goals: As is.   Behavioral modification strategies: no skipping meals.  [name] has agreed to follow-up with our clinic in 4 weeks. She was informed of the importance of frequent follow-up visits to maximize her success with intensive lifestyle modifications for her multiple health conditions.   Objective:   Blood pressure 120/79, pulse 83, temperature 97.9 F (36.6 C), height 5\' 5"  (1.651 m), weight 216 lb (98 kg), SpO2 96 %, unknown if currently breastfeeding. Body mass index is 35.94 kg/m.  Lab Results  Component Value Date   CREATININE 0.77 03/25/2022   BUN 12 03/25/2022   NA 140 03/25/2022   K 5.1 03/25/2022    CL 106 03/25/2022   CO2 22 03/25/2022   Lab Results  Component Value Date   ALT 12 03/25/2022   AST 14 03/25/2022   ALKPHOS 66 03/25/2022   BILITOT 0.3 03/25/2022   Lab Results  Component Value Date   HGBA1C 5.6 03/25/2022   HGBA1C 5.7 (H) 11/16/2021   HGBA1C 5.6 09/15/2021   HGBA1C 5.7 (H) 02/25/2021   HGBA1C 5.7 (H) 11/05/2020   Lab Results  Component Value Date   INSULIN 13.7 03/25/2022   INSULIN 17.3 11/16/2021   INSULIN 15.0 09/15/2021   INSULIN 16.6 02/25/2021   INSULIN 20.4 11/05/2020   Lab Results  Component Value Date   TSH 1.420 03/25/2022   Lab Results  Component Value Date   CHOL 158 03/25/2022   HDL 48 03/25/2022   LDLCALC 99 03/25/2022   TRIG 52 03/25/2022   CHOLHDL 2.6 07/15/2020   Lab Results  Component Value Date   VD25OH 50.7 03/25/2022   VD25OH 51.9 11/16/2021   VD25OH 56.0 09/15/2021   Lab Results  Component Value Date   WBC 5.3 03/25/2022   HGB 12.5 03/25/2022   HCT 39.4 03/25/2022   MCV 89 03/25/2022   PLT 412 03/25/2022   Lab Results  Component Value Date   IRON 54 09/30/2017   TIBC 373 09/30/2017   FERRITIN 81 09/30/2017   Attestation Statements:   Reviewed by clinician on day of visit: allergies, medications, problem list, medical history, surgical history, family history, social  history, and previous encounter notes.   I, Trixie Dredge, am acting as transcriptionist for Dennard Nip, MD.  I have reviewed the above documentation for accuracy and completeness, and I agree with the above. -  Dennard Nip, MD

## 2022-05-02 ENCOUNTER — Encounter: Payer: Self-pay | Admitting: Internal Medicine

## 2022-05-20 ENCOUNTER — Other Ambulatory Visit: Payer: Self-pay

## 2022-05-20 ENCOUNTER — Other Ambulatory Visit (HOSPITAL_COMMUNITY): Payer: Self-pay

## 2022-05-26 ENCOUNTER — Other Ambulatory Visit (HOSPITAL_COMMUNITY): Payer: Self-pay

## 2022-06-01 ENCOUNTER — Encounter (INDEPENDENT_AMBULATORY_CARE_PROVIDER_SITE_OTHER): Payer: Self-pay | Admitting: Family Medicine

## 2022-06-01 ENCOUNTER — Ambulatory Visit (INDEPENDENT_AMBULATORY_CARE_PROVIDER_SITE_OTHER): Payer: BC Managed Care – PPO | Admitting: Family Medicine

## 2022-06-01 VITALS — BP 112/73 | HR 76 | Temp 97.8°F | Ht 65.0 in | Wt 213.0 lb

## 2022-06-01 DIAGNOSIS — R632 Polyphagia: Secondary | ICD-10-CM | POA: Diagnosis not present

## 2022-06-01 DIAGNOSIS — Z6835 Body mass index (BMI) 35.0-35.9, adult: Secondary | ICD-10-CM | POA: Diagnosis not present

## 2022-06-01 DIAGNOSIS — E669 Obesity, unspecified: Secondary | ICD-10-CM

## 2022-06-01 DIAGNOSIS — E559 Vitamin D deficiency, unspecified: Secondary | ICD-10-CM | POA: Diagnosis not present

## 2022-06-01 MED ORDER — WEGOVY 0.5 MG/0.5ML ~~LOC~~ SOAJ
0.5000 mg | SUBCUTANEOUS | 0 refills | Status: DC
Start: 2022-06-01 — End: 2022-06-03

## 2022-06-02 ENCOUNTER — Encounter (INDEPENDENT_AMBULATORY_CARE_PROVIDER_SITE_OTHER): Payer: Self-pay | Admitting: Family Medicine

## 2022-06-02 DIAGNOSIS — R632 Polyphagia: Secondary | ICD-10-CM

## 2022-06-02 NOTE — Telephone Encounter (Signed)
Please send to [facility]

## 2022-06-02 NOTE — Progress Notes (Signed)
Chief Complaint:   OBESITY [name] is here to discuss her progress with her obesity treatment plan along with follow-up of her obesity related diagnoses. [name] is on the Category 3 Plan and states she is following her eating plan approximately 85% of the time. [name] states she is walking for 20 minutes 3 times per week.  Today's visit was #: 59 Starting weight: 233 lbs Starting date: 02/06/2018 Today's weight: 213 lbs Today's date: 06/01/2022 Total lbs lost to date: 20 Total lbs lost since last in-office visit: 3  Interim History: [name] continues to do well with her weight loss.  She is working on meal planning and prepping.  She has increased her walking recently.  Her hunger is appropriate.  Subjective:   1. Polyphagia [name] is on Zepbound, but her insurance will no longer cover. She was told that they may cover Wegovy again.   2. Vitamin D deficiency [name]'s last vitamin D level was at goal on OTC vitamin D, and labs were reviewed today with the patient.  Assessment/Plan:   1. [name] agreed to discontinue Zepbound, and start Wegovy at 0.5 mg once weekly with no refills.   - Semaglutide-Weight Management (WEGOVY) 0.5 MG/0.5ML SOAJ; Inject 0.5 mg into the skin once a week.  Dispense: 2 mL; Refill: 0  2. Vitamin D deficiency [name] will continue OTC Vitamin D 3,000 IU daily and we will continue to follow.   3. BMI 35.0-35.9,adult  4. Obesity, Beginning BMI 38.77 [name] is currently in the action stage of change. As such, her goal is to continue with weight loss efforts. She has agreed to the Category 3 Plan.   Exercise goals: As is.   Behavioral modification strategies: meal planning and cooking strategies.  [name] has agreed to follow-up with our clinic in 4 weeks. She was informed of the importance of frequent follow-up visits to maximize her success with intensive lifestyle modifications for her multiple health conditions.   Objective:   Blood  pressure 112/73, pulse 76, temperature 97.8 F (36.6 C), height 5\' 5"  (1.651 m), weight 213 lb (96.6 kg), SpO2 98 %, unknown if currently breastfeeding. Body mass index is 35.45 kg/m.  Lab Results  Component Value Date   CREATININE 0.77 03/25/2022   BUN 12 03/25/2022   NA 140 03/25/2022   K 5.1 03/25/2022   CL 106 03/25/2022   CO2 22 03/25/2022   Lab Results  Component Value Date   ALT 12 03/25/2022   AST 14 03/25/2022   ALKPHOS 66 03/25/2022   BILITOT 0.3 03/25/2022   Lab Results  Component Value Date   HGBA1C 5.6 03/25/2022   HGBA1C 5.7 (H) 11/16/2021   HGBA1C 5.6 09/15/2021   HGBA1C 5.7 (H) 02/25/2021   HGBA1C 5.7 (H) 11/05/2020   Lab Results  Component Value Date   INSULIN 13.7 03/25/2022   INSULIN 17.3 11/16/2021   INSULIN 15.0 09/15/2021   INSULIN 16.6 02/25/2021   INSULIN 20.4 11/05/2020   Lab Results  Component Value Date   TSH 1.420 03/25/2022   Lab Results  Component Value Date   CHOL 158 03/25/2022   HDL 48 03/25/2022   LDLCALC 99 03/25/2022   TRIG 52 03/25/2022   CHOLHDL 2.6 07/15/2020   Lab Results  Component Value Date   VD25OH 50.7 03/25/2022   VD25OH 51.9 11/16/2021   VD25OH 56.0 09/15/2021   Lab Results  Component Value Date   WBC 5.3 03/25/2022   HGB 12.5 03/25/2022   HCT 39.4 03/25/2022  MCV 89 03/25/2022   PLT 412 03/25/2022   Lab Results  Component Value Date   IRON 54 09/30/2017   TIBC 373 09/30/2017   FERRITIN 81 09/30/2017   Attestation Statements:   Reviewed by clinician on day of visit: allergies, medications, problem list, medical history, surgical history, family history, social history, and previous encounter notes.   I, Burt Knack, am acting as transcriptionist for Quillian Quince, MD.  I have reviewed the above documentation for accuracy and completeness, and I agree with the above. -  Quillian Quince, MD

## 2022-06-03 ENCOUNTER — Other Ambulatory Visit (HOSPITAL_COMMUNITY): Payer: Self-pay

## 2022-06-03 ENCOUNTER — Encounter (HOSPITAL_COMMUNITY): Payer: Self-pay

## 2022-06-03 MED ORDER — WEGOVY 0.5 MG/0.5ML ~~LOC~~ SOAJ
0.5000 mg | SUBCUTANEOUS | 0 refills | Status: DC
Start: 2022-06-03 — End: 2022-08-12
  Filled 2022-06-03: qty 2, 28d supply, fill #0

## 2022-06-09 ENCOUNTER — Other Ambulatory Visit (HOSPITAL_COMMUNITY): Payer: Self-pay

## 2022-06-11 ENCOUNTER — Other Ambulatory Visit (HOSPITAL_COMMUNITY): Payer: Self-pay

## 2022-06-28 ENCOUNTER — Other Ambulatory Visit: Payer: Self-pay | Admitting: Internal Medicine

## 2022-06-28 NOTE — Telephone Encounter (Signed)
Only seen for acute visits since [name] 2023 when Depression was last discussed.

## 2022-06-30 ENCOUNTER — Encounter (INDEPENDENT_AMBULATORY_CARE_PROVIDER_SITE_OTHER): Payer: Self-pay | Admitting: Family Medicine

## 2022-06-30 ENCOUNTER — Ambulatory Visit (INDEPENDENT_AMBULATORY_CARE_PROVIDER_SITE_OTHER): Payer: BC Managed Care – PPO | Admitting: Family Medicine

## 2022-06-30 VITALS — BP 127/82 | HR 79 | Temp 98.2°F | Ht 65.0 in | Wt 213.0 lb

## 2022-06-30 DIAGNOSIS — F3289 Other specified depressive episodes: Secondary | ICD-10-CM

## 2022-06-30 DIAGNOSIS — Z6835 Body mass index (BMI) 35.0-35.9, adult: Secondary | ICD-10-CM | POA: Diagnosis not present

## 2022-06-30 DIAGNOSIS — R4689 Other symptoms and signs involving appearance and behavior: Secondary | ICD-10-CM

## 2022-06-30 DIAGNOSIS — E669 Obesity, unspecified: Secondary | ICD-10-CM | POA: Diagnosis not present

## 2022-06-30 DIAGNOSIS — R632 Polyphagia: Secondary | ICD-10-CM

## 2022-07-05 NOTE — Progress Notes (Unsigned)
Chief Complaint:   OBESITY [name] is here to discuss her progress with her obesity treatment plan along with follow-up of her obesity related diagnoses. [name] is on the Category 3 Plan and states she is following her eating plan approximately 85% of the time. [name] states she is walking for 20 minutes 3-4 times per week.  Today's visit was #: 60 Starting weight: 233 lbs Starting date: 02/06/2018 Today's weight: 213 lbs Today's date: 06/30/2022 Total lbs lost to date: 20 Total lbs lost since last in-office visit: 0  Interim History: Patient recently changed jobs and her stress has increased.  She had got off track with her eating plan.  She did well with avoiding weight gain though which is a good sign.  Subjective:   1. Emotional Eating Behavior Patient continues to work on decreasing emotional eating behavior.  She is doing well on her medications even with increased stress with changing jobs.  No side effects were noted.  Assessment/Plan:   1. Emotional Eating Behavior Patient will continue Prozac and Wellbutrin.  2. BMI 35.0-35.9,adult  3. Obesity, Beginning BMI 38.77 [name] is currently in the action stage of change. As such, her goal is to continue with weight loss efforts. She has agreed to the Category 3 Plan.   Exercise goals: As is.   Behavioral modification strategies: increasing lean protein intake and meal planning and cooking strategies.  [name] has agreed to follow-up with our clinic in 4 weeks. She was informed of the importance of frequent follow-up visits to maximize her success with intensive lifestyle modifications for her multiple health conditions.   Objective:   Blood pressure 127/82, pulse 79, temperature 98.2 F (36.8 C), height 5\' 5"  (1.651 m), weight 213 lb (96.6 kg), SpO2 95 %, unknown if currently breastfeeding. Body mass index is 35.45 kg/m.  Lab Results  Component Value Date   CREATININE 0.77 03/25/2022   BUN 12 03/25/2022   NA 140  03/25/2022   K 5.1 03/25/2022   CL 106 03/25/2022   CO2 22 03/25/2022   Lab Results  Component Value Date   ALT 12 03/25/2022   AST 14 03/25/2022   ALKPHOS 66 03/25/2022   BILITOT 0.3 03/25/2022   Lab Results  Component Value Date   HGBA1C 5.6 03/25/2022   HGBA1C 5.7 (H) 11/16/2021   HGBA1C 5.6 09/15/2021   HGBA1C 5.7 (H) 02/25/2021   HGBA1C 5.7 (H) 11/05/2020   Lab Results  Component Value Date   INSULIN 13.7 03/25/2022   INSULIN 17.3 11/16/2021   INSULIN 15.0 09/15/2021   INSULIN 16.6 02/25/2021   INSULIN 20.4 11/05/2020   Lab Results  Component Value Date   TSH 1.420 03/25/2022   Lab Results  Component Value Date   CHOL 158 03/25/2022   HDL 48 03/25/2022   LDLCALC 99 03/25/2022   TRIG 52 03/25/2022   CHOLHDL 2.6 07/15/2020   Lab Results  Component Value Date   VD25OH 50.7 03/25/2022   VD25OH 51.9 11/16/2021   VD25OH 56.0 09/15/2021   Lab Results  Component Value Date   WBC 5.3 03/25/2022   HGB 12.5 03/25/2022   HCT 39.4 03/25/2022   MCV 89 03/25/2022   PLT 412 03/25/2022   Lab Results  Component Value Date   IRON 54 09/30/2017   TIBC 373 09/30/2017   FERRITIN 81 09/30/2017   Attestation Statements:   Reviewed by clinician on day of visit: allergies, medications, problem list, medical history, surgical history, family history, social history, and  previous encounter notes.  Time spent on visit including pre-visit chart review and post-visit care and charting was 20 minutes.   I, Burt Knack, am acting as transcriptionist for Quillian Quince, MD.  I have reviewed the above documentation for accuracy and completeness, and I agree with the above. -  Quillian Quince, MD

## 2022-07-21 ENCOUNTER — Encounter: Payer: Self-pay | Admitting: Internal Medicine

## 2022-07-28 ENCOUNTER — Ambulatory Visit (INDEPENDENT_AMBULATORY_CARE_PROVIDER_SITE_OTHER): Payer: BC Managed Care – PPO | Admitting: Family Medicine

## 2022-08-12 ENCOUNTER — Ambulatory Visit (INDEPENDENT_AMBULATORY_CARE_PROVIDER_SITE_OTHER): Payer: BC Managed Care – PPO | Admitting: Adult Health

## 2022-08-12 ENCOUNTER — Encounter (INDEPENDENT_AMBULATORY_CARE_PROVIDER_SITE_OTHER): Payer: Self-pay | Admitting: Adult Health

## 2022-08-12 ENCOUNTER — Other Ambulatory Visit (HOSPITAL_COMMUNITY): Payer: Self-pay

## 2022-08-12 VITALS — BP 123/78 | HR 74 | Temp 98.0°F | Ht 65.0 in | Wt 222.0 lb

## 2022-08-12 DIAGNOSIS — E669 Obesity, unspecified: Secondary | ICD-10-CM

## 2022-08-12 DIAGNOSIS — R632 Polyphagia: Secondary | ICD-10-CM | POA: Diagnosis not present

## 2022-08-12 DIAGNOSIS — R7303 Prediabetes: Secondary | ICD-10-CM

## 2022-08-12 DIAGNOSIS — Z6836 Body mass index (BMI) 36.0-36.9, adult: Secondary | ICD-10-CM

## 2022-08-12 DIAGNOSIS — F3289 Other specified depressive episodes: Secondary | ICD-10-CM

## 2022-08-12 MED ORDER — ZEPBOUND 2.5 MG/0.5ML ~~LOC~~ SOAJ
2.5000 mg | SUBCUTANEOUS | 0 refills | Status: DC
  Filled 2022-08-12: qty 2, 28d supply, fill #0

## 2022-08-12 NOTE — Progress Notes (Signed)
WEIGHT SUMMARY AND BIOMETRICS  Vitals Temp: 98 F (36.7 C) BP: 123/78 Pulse Rate: 74 SpO2: 100 %   Anthropometric Measurements Height: 5\' 5"  (1.651 m) Weight: 222 lb (100.7 kg) BMI (Calculated): 36.94 Weight at Last Visit: 213lb Weight Lost Since Last Visit: 0 Weight Gained Since Last Visit: 9lb Starting Weight: 233lb Total Weight Loss (lbs): 11 lb (4.99 kg)   Body Composition  Body Fat %: 38.8 % Fat Mass (lbs): 86.2 lbs Muscle Mass (lbs): 129.4 lbs Total Body Water (lbs): 94.4 lbs Visceral Fat Rating : 10   Other Clinical Data Fasting: no Labs: no Today's Visit #: 60 Starting Date: 02/06/18    Chief Complaint:   OBESITY [name] is here to discuss her progress with her obesity treatment plan. She is on the the Category 3 Plan and states she is following her eating plan approximately 0 % of the time. She states she is not currently exercising.   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 Her youngest child, [name] (age 13) will resume school year on August 6th. After he begins 2nd grade- [name] will have more time to devout to meal planning/prepping and exercise.  Reviewed Bioimpedance results with pt: Muscle Mass:+3.8 lbs Adipose Mass:+5.2 lbs  Subjective:   1. Prediabetes Lab Results  Component Value Date   HGBA1C 5.6 03/25/2022   HGBA1C 5.7 (H) 11/16/2021   HGBA1C 5.6 09/15/2021   She denies family hx of MEN 2 or MTC She denies personal hx of pancreatitis. She is agreeable restarting Zepbound therapy  2. Polyphagia Zepbound was replaced with Wegovy on 06/01/2022. Reginal Lutes was cost prohibitive. Last dose of Zepbound 2.5mg  May 2024 She was tolerating injectable therapy well. She reports dramatic increase in polyphagia since May 2024.  3. Emotional Eating Behavior Ms.  Coen reports that she has been off both Fluoxetine 20mg  and Bupropion XL 300 mg for several weeks. She reports PCP refilling Fluoxetine and OB refilling Buproprion XL. She will restart both in the next week. She reports increase in anxiety sx's  Assessment/Plan:   1. Prediabetes Restart GIP/GLP-1 therapy  2. Polyphagia Increase protein and limit sugar/CHO Restart Zepbound therapy  3. Emotional Eating Behavior Restart Fluoxetine and Bupropion therapy as directed  4. Obesity, current BMI 36.94 Restart tirzepatide (ZEPBOUND) 2.5 MG/0.5ML Pen Inject 2.5 mg into the skin once a week. Dispense: 3 mL, Refills: 0 of 0 remaining   [name] is not currently in the action stage of change. As such, her goal is to get back to weightloss efforts . She has agreed to the Category 3 Plan.   Exercise goals: For substantial health benefits, adults should do at least 150 minutes (2 hours and 30 minutes) a week of moderate-intensity, or 75 minutes (1 hour and 15 minutes) a week of vigorous-intensity aerobic physical activity, or an equivalent combination of moderate- and vigorous-intensity aerobic activity. Aerobic activity should be performed in episodes of at least 10 minutes, and preferably, it should be spread throughout the week.  Behavioral modification strategies: increasing lean protein intake, decreasing simple carbohydrates, increasing vegetables, increasing water intake, no skipping meals, meal planning and cooking strategies, and planning for success.  [name] has agreed to follow-up with our clinic in 4 weeks. She was informed of the importance of frequent follow-up visits to maximize her success with intensive lifestyle modifications for  her multiple health conditions.   Objective:   Blood pressure 123/78, pulse 74, temperature 98 F (36.7 C), height 5\' 5"  (1.651 m), weight 222 lb (100.7 kg), SpO2 100%, unknown if currently breastfeeding. Body mass index is 36.94 kg/m.  General:  Cooperative, alert, well developed, in no acute distress. HEENT: Conjunctivae and lids unremarkable. Cardiovascular: Regular rhythm.  Lungs: Normal work of breathing. Neurologic: No focal deficits.   Lab Results  Component Value Date   CREATININE 0.77 03/25/2022   BUN 12 03/25/2022   NA 140 03/25/2022   K 5.1 03/25/2022   CL 106 03/25/2022   CO2 22 03/25/2022   Lab Results  Component Value Date   ALT 12 03/25/2022   AST 14 03/25/2022   ALKPHOS 66 03/25/2022   BILITOT 0.3 03/25/2022   Lab Results  Component Value Date   HGBA1C 5.6 03/25/2022   HGBA1C 5.7 (H) 11/16/2021   HGBA1C 5.6 09/15/2021   HGBA1C 5.7 (H) 02/25/2021   HGBA1C 5.7 (H) 11/05/2020   Lab Results  Component Value Date   INSULIN 13.7 03/25/2022   INSULIN 17.3 11/16/2021   INSULIN 15.0 09/15/2021   INSULIN 16.6 02/25/2021   INSULIN 20.4 11/05/2020   Lab Results  Component Value Date   TSH 1.420 03/25/2022   Lab Results  Component Value Date   CHOL 158 03/25/2022   HDL 48 03/25/2022   LDLCALC 99 03/25/2022   TRIG 52 03/25/2022   CHOLHDL 2.6 07/15/2020   Lab Results  Component Value Date   VD25OH 50.7 03/25/2022   VD25OH 51.9 11/16/2021   VD25OH 56.0 09/15/2021   Lab Results  Component Value Date   WBC 5.3 03/25/2022   HGB 12.5 03/25/2022   HCT 39.4 03/25/2022   MCV 89 03/25/2022   PLT 412 03/25/2022   Lab Results  Component Value Date   IRON 54 09/30/2017   TIBC 373 09/30/2017   FERRITIN 81 09/30/2017    Attestation Statements:   Reviewed by clinician on day of visit: allergies, medications, problem list, medical history, surgical history, family history, social history, and previous encounter notes.  I have reviewed the above documentation for accuracy and completeness, and I agree with the above. -  Cayton Cuevas d. Uno Esau, NP-C

## 2022-08-20 ENCOUNTER — Other Ambulatory Visit (HOSPITAL_COMMUNITY): Payer: Self-pay

## 2022-09-14 ENCOUNTER — Encounter (INDEPENDENT_AMBULATORY_CARE_PROVIDER_SITE_OTHER): Payer: Self-pay | Admitting: Adult Health

## 2022-09-14 ENCOUNTER — Ambulatory Visit (INDEPENDENT_AMBULATORY_CARE_PROVIDER_SITE_OTHER): Payer: BC Managed Care – PPO | Admitting: Adult Health

## 2022-09-14 VITALS — BP 105/71 | HR 84 | Temp 98.2°F | Ht 65.0 in | Wt 215.0 lb

## 2022-09-14 DIAGNOSIS — E669 Obesity, unspecified: Secondary | ICD-10-CM

## 2022-09-14 DIAGNOSIS — Z6835 Body mass index (BMI) 35.0-35.9, adult: Secondary | ICD-10-CM

## 2022-09-14 DIAGNOSIS — R632 Polyphagia: Secondary | ICD-10-CM | POA: Diagnosis not present

## 2022-09-14 DIAGNOSIS — R7303 Prediabetes: Secondary | ICD-10-CM | POA: Diagnosis not present

## 2022-09-14 NOTE — Progress Notes (Signed)
WEIGHT SUMMARY AND BIOMETRICS  Vitals Temp: 98.2 F (36.8 C) BP: 105/71 Pulse Rate: 84 SpO2: 99 %   Anthropometric Measurements Height: 5\' 5"  (1.651 m) Weight: 215 lb (97.5 kg) BMI (Calculated): 35.78 Weight at Last Visit: 222lb Weight Lost Since Last Visit: 7lb Weight Gained Since Last Visit: 0 Starting Weight: 233lb Total Weight Loss (lbs): 18 lb (8.165 kg)   Body Composition  Body Fat %: 36 % Fat Mass (lbs): 77.6 lbs Muscle Mass (lbs): 131 lbs Total Body Water (lbs): 88 lbs Visceral Fat Rating : 9   Other Clinical Data Fasting: no Labs: no Today's Visit #: 75 Starting Date: 02/06/18    Chief Complaint:   OBESITY [name] is here to discuss her progress with her obesity treatment plan. She is on the the Category 3 Plan and states she is following her eating plan approximately 75 % of the time. She states she is exercising Brisk Walking 20 minutes 2-3 times per week.   Interim History:   Hunger/appetite-she endorses stable   Stress- she endorses SIGNIFICANTLY reduced stress since changing positions as [name]  Exercise-She is walking briskly at least 3 times per week, at least  Reviewed Bioempedence results with pt: Muscle Mass: +1.6 lbs Adipose Mass: -8.6 lbs  Subjective:   1. Polyphagia Hunger/appetite-she endorses stable  She reports consuming "Dr. Dalbert Garnet" smoothie each morning. This will keep her well satiated until lunch. Smoothie contents: Cup Fair Life Milk Cup Mixed Berries Cup ice Single serving cup of Light Greek Yogurt  30 g protein in this smoothie  2. Prediabetes Lab Results  Component Value Date   HGBA1C 5.6 03/25/2022   HGBA1C 5.7 (H) 11/16/2021   HGBA1C 5.6 09/15/2021    She was only able to afford 2 months of Zepbounf 2.5mg - after $25 coupon was exhausted-coverage increased to $500/month- cost prohibitive Last dose of Wegovy therapy was May 2024  Assessment/Plan:   1. Polyphagia Increase protein  and limit simple CHO Continue regular walking  2. Prediabetes Increase protein and limit simple CHO Continue regular walking  3. Obesity, current BMI 35.78  [name] is currently in the action stage of change. As such, her goal is to continue with weight loss efforts. She has agreed to the Category 3 Plan.   Exercise goals: For substantial health benefits, adults should do at least 150 minutes (2 hours and 30 minutes) a week of moderate-intensity, or 75 minutes (1 hour and 15 minutes) a week of vigorous-intensity aerobic physical activity, or an equivalent combination of moderate- and vigorous-intensity aerobic activity. Aerobic activity should be performed in episodes of at least 10 minutes, and preferably, it should be spread throughout the week.  Behavioral modification strategies: increasing lean protein intake, decreasing simple carbohydrates, increasing vegetables, increasing water intake, no skipping meals, meal planning and cooking strategies, ways to avoid boredom eating, and planning for success.  [name] has agreed to follow-up with our clinic in 4 weeks. She was informed of the importance of frequent follow-up visits to maximize her success with intensive lifestyle modifications for her multiple health conditions.   Objective:   Blood pressure 105/71, pulse 84, temperature 98.2 F (36.8 C), height 5\' 5"  (1.651 m), weight 215 lb (97.5 kg), SpO2 99%, unknown if currently breastfeeding. Body mass index is 35.78 kg/m.  General: Cooperative, alert, well developed, in no acute distress. HEENT: Conjunctivae and lids unremarkable. Cardiovascular: Regular rhythm.  Lungs: Normal work of breathing. Neurologic: No focal deficits.   Lab Results  Component Value Date   CREATININE 0.77 03/25/2022   BUN 12 03/25/2022   NA 140 03/25/2022   K 5.1 03/25/2022   CL 106 03/25/2022   CO2 22 03/25/2022   Lab Results  Component Value Date   ALT 12 03/25/2022   AST 14 03/25/2022   ALKPHOS  66 03/25/2022   BILITOT 0.3 03/25/2022   Lab Results  Component Value Date   HGBA1C 5.6 03/25/2022   HGBA1C 5.7 (H) 11/16/2021   HGBA1C 5.6 09/15/2021   HGBA1C 5.7 (H) 02/25/2021   HGBA1C 5.7 (H) 11/05/2020   Lab Results  Component Value Date   INSULIN 13.7 03/25/2022   INSULIN 17.3 11/16/2021   INSULIN 15.0 09/15/2021   INSULIN 16.6 02/25/2021   INSULIN 20.4 11/05/2020   Lab Results  Component Value Date   TSH 1.420 03/25/2022   Lab Results  Component Value Date   CHOL 158 03/25/2022   HDL 48 03/25/2022   LDLCALC 99 03/25/2022   TRIG 52 03/25/2022   CHOLHDL 2.6 07/15/2020   Lab Results  Component Value Date   VD25OH 50.7 03/25/2022   VD25OH 51.9 11/16/2021   VD25OH 56.0 09/15/2021   Lab Results  Component Value Date   WBC 5.3 03/25/2022   HGB 12.5 03/25/2022   HCT 39.4 03/25/2022   MCV 89 03/25/2022   PLT 412 03/25/2022   Lab Results  Component Value Date   IRON 54 09/30/2017   TIBC 373 09/30/2017   FERRITIN 81 09/30/2017    Attestation Statements:   Reviewed by clinician on day of visit: allergies, medications, problem list, medical history, surgical history, family history, social history, and previous encounter notes.  I have reviewed the above documentation for accuracy and completeness, and I agree with the above. -  Latyra Jaye d. Lavar Rosenzweig, NP-C

## 2022-10-04 ENCOUNTER — Telehealth (INDEPENDENT_AMBULATORY_CARE_PROVIDER_SITE_OTHER): Payer: Self-pay | Admitting: *Deleted

## 2022-10-04 NOTE — Telephone Encounter (Signed)
Additional Information Required Your PA has been resolved, no additional PA is required. For further inquiries please contact the number on the back of the member prescription card.

## 2022-10-07 ENCOUNTER — Telehealth (INDEPENDENT_AMBULATORY_CARE_PROVIDER_SITE_OTHER): Payer: BC Managed Care – PPO | Admitting: Internal Medicine

## 2022-10-07 ENCOUNTER — Telehealth: Payer: Self-pay | Admitting: Internal Medicine

## 2022-10-07 ENCOUNTER — Telehealth: Payer: Self-pay

## 2022-10-07 ENCOUNTER — Encounter: Payer: Self-pay | Admitting: Internal Medicine

## 2022-10-07 VITALS — HR 92 | Ht 65.0 in

## 2022-10-07 DIAGNOSIS — U071 COVID-19: Secondary | ICD-10-CM | POA: Diagnosis not present

## 2022-10-07 MED ORDER — FLUCONAZOLE 150 MG PO TABS: ORAL_TABLET | ORAL | 1 refills | Status: DC

## 2022-10-07 MED ORDER — AZITHROMYCIN 250 MG PO TABS: ORAL_TABLET | ORAL | 0 refills | Status: AC

## 2022-10-07 MED ORDER — HYDROCODONE BIT-HOMATROP MBR 5-1.5 MG/5ML PO SOLN: 5.0000 mL | Freq: Three times a day (TID) | ORAL | 0 refills | Status: DC | PRN

## 2022-10-07 NOTE — Telephone Encounter (Signed)
[name] called back to see if she could get Diflucan sent in also since she got an antibiotic today. I let her know it would be tomorrow before we would have an answer.

## 2022-10-07 NOTE — Telephone Encounter (Signed)
[name] called back to say COVID test was positive

## 2022-10-07 NOTE — Progress Notes (Signed)
Patient Care Team: Margaree Mackintosh, MD as PCP - General (Internal Medicine) Karl Pock.Collins as Careers information officer)  I connected with Kris Hartmann on 10/07/22 at 12:10 PM by video enabled telemedicine visit and verified that I am speaking with the correct person using two identifiers.   I discussed the limitations, risks, security and privacy concerns of performing an evaluation and management service by telemedicine and the availability of in-person appointments. I also discussed with the patient that there may be a patient responsible charge related to this service. The patient expressed understanding and agreed to proceed.   Other persons participating in the visit and their role in the encounter: Medical scribe, Doylene Bode  Patient's location: Home  Provider's location: Clinic   I provided 15 minutes of time with  this encounter, and > 50% was spent counseling as documented under my assessment & plan.  This includes chart review, interviewing patient and medical decision making as well as e- scribing medications.  She is identified using two identifiers, [name], a patient of this practice. She is in her house and I am at my office.  She is agreeable to visit in this format today.  Chief Complaint: cough, headache   Subjective:    Patient ID: [name] , Female    DOB: 15-Mar-1980, 42 y.o.    MRN: [id number]   42 y.o. Female presents today for cough, headache. Symptoms began with cough on 09/30/22 and she later developed intermittent frontal/ocular headache. She had an isolated episode of shortness of breath that has resolved. Reports positive at-home Covid-19 test. She went to a family reunion on 10/02/22. She was seen in urgent care on 10/02/22 and given prednisone.   Past Medical History:  Diagnosis Date   Class 2 obesity due to excess calories with body mass index (BMI) of 35.0 to 35.9 in adult 02/08/2018   GERD (gastroesophageal reflux disease)     not currently   Gestational diabetes mellitus (GDM), antepartum    Heart murmur    HSV infection    Leaky heart valve    Prediabetes    Vitamin D deficiency      Family History  Problem Relation Age of Onset   Hypertension Mother    Sleep apnea Mother    Obesity Mother    Diabetes Father    High Cholesterol Father    Cancer Father    Hypertension Brother    Diabetes Brother     Social Hx: Married.  She has 2 children, a son and a daughter. Does not smoke. Husband is a Emergency planning/management officer. 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 History of gestational diabetes.  Additional past medical history: Carpal tunnel surgery in the remote past by Dr. Teressa Senter.  Wisdom teeth extraction in the remote past as well as tonsillectomy.  History of 3 mm left ureteral stone in 2014.     Review of Systems  Constitutional:  Negative for fever and malaise/fatigue.  HENT:  Negative for congestion.   Eyes:  Negative for blurred vision.  Respiratory:  Positive for cough. Negative for shortness of breath.   Cardiovascular:  Negative for chest pain, palpitations and leg swelling.  Gastrointestinal:  Negative for vomiting.  Musculoskeletal:  Negative for back pain.  Skin:  Negative for rash.  Neurological:  Positive for headaches. Negative for loss of consciousness.        Objective:   Vitals: Pulse 92   Ht 5'  5" (1.651 m)   SpO2 99%   BMI 35.78 kg/m    Physical Exam Vitals and nursing note reviewed.  Constitutional:      General: She is not in acute distress.    Appearance: Normal appearance. She is not toxic-appearing.  HENT:     Head: Normocephalic and atraumatic.  Pulmonary:     Effort: Pulmonary effort is normal.  Skin:    General: Skin is warm and dry.  Neurological:     Mental Status: She is alert and oriented to person, place, and time. Mental status is at baseline.  Psychiatric:        Mood and Affect: Mood normal.        Behavior: Behavior normal.         Thought Content: Thought content normal.        Judgment: Judgment normal.       Results:   Studies obtained and personally reviewed by me:   Labs:       Component Value Date/Time   NA 140 03/25/2022 0917   K 5.1 03/25/2022 0917   CL 106 03/25/2022 0917   CO2 22 03/25/2022 0917   GLUCOSE 90 03/25/2022 0917   GLUCOSE 88 03/18/2020 1636   BUN 12 03/25/2022 0917   CREATININE 0.77 03/25/2022 0917   CREATININE 0.73 03/18/2020 1636   CALCIUM 9.8 03/25/2022 0917   PROT 7.4 03/25/2022 0917   ALBUMIN 4.5 03/25/2022 0917   AST 14 03/25/2022 0917   ALT 12 03/25/2022 0917   ALKPHOS 66 03/25/2022 0917   BILITOT 0.3 03/25/2022 0917   GFRNONAA 103 03/18/2020 1636   GFRAA 119 03/18/2020 1636     Lab Results  Component Value Date   WBC 5.3 03/25/2022   HGB 12.5 03/25/2022   HCT 39.4 03/25/2022   MCV 89 03/25/2022   PLT 412 03/25/2022    Lab Results  Component Value Date   CHOL 158 03/25/2022   HDL 48 03/25/2022   LDLCALC 99 03/25/2022   TRIG 52 03/25/2022   CHOLHDL 2.6 07/15/2020    Lab Results  Component Value Date   HGBA1C 5.6 03/25/2022     Lab Results  Component Value Date   TSH 1.420 03/25/2022      Assessment & Plan:   Acute Covid-19 infection: prescribed Z-Pak two tabs day 1 followed by one tab days 2-5, Hycodan syrup 1 tsp every 8 hours as needed for cough, Diflucan 150 mg once if needed for Candida vaginitis. Advised to quarantine for 5 days. Get plenty of rest, stay well-hydrated and walk regularly. Contact us if new symptoms develop or current symptoms worsen or do not improve.  Vaccine counseling: suggested Covid-19, flu boosters in a couple of months on different days.    I,Alexander Ruley,acting as a Neurosurgeon for Margaree Mackintosh, MD.,have documented all relevant documentation on the behalf of Margaree Mackintosh, MD,as directed by  Margaree Mackintosh, MD while in the presence of Margaree Mackintosh, MD.   I, Margaree Mackintosh, MD, have reviewed all documentation for  this visit. The documentation on 10/09/22 for the exam, diagnosis, procedures, and orders are all accurate and complete.

## 2022-10-07 NOTE — Patient Instructions (Addendum)
Zithromax Z-PAK prescribed.  Also offered Paxlovid but decided since symptoms are fairly mild we would go with Z-Pak.  She will take 2 tablets day 1 followed by 1 tablet days 2 through 5.  Quarantine at home for 5 days.  Monitor pulse oximetry. Rest and stay well hydrated.  Also prescribed Hycodan 1 teaspoon every 8 hours as needed for cough.  Diflucan 150 mg one-time dose if needed for Candida vaginitis while on antibiotics.  Rest, walk around the house some to prevent atelectasis.  Monitor pulse oximetry.  Stay well-hydrated.  May get new COVID booster in a couple of months.  May get flu vaccine in about 4 weeks.

## 2022-10-07 NOTE — Telephone Encounter (Signed)
Scheduled video visit

## 2022-10-07 NOTE — Telephone Encounter (Signed)
Patient called has had a headache off and on for a week yesterday got worse. She went to urgent care on 9/14 for bronchitis, but she still coughing and is congested. She is going to call me back with COVID results.

## 2022-10-09 ENCOUNTER — Encounter: Payer: Self-pay | Admitting: Internal Medicine

## 2022-10-13 ENCOUNTER — Encounter (INDEPENDENT_AMBULATORY_CARE_PROVIDER_SITE_OTHER): Payer: Self-pay | Admitting: Adult Health

## 2022-10-13 ENCOUNTER — Ambulatory Visit (INDEPENDENT_AMBULATORY_CARE_PROVIDER_SITE_OTHER): Payer: BC Managed Care – PPO | Admitting: Adult Health

## 2022-10-13 VITALS — BP 126/77 | HR 92 | Temp 98.2°F | Ht 65.0 in | Wt 214.0 lb

## 2022-10-13 DIAGNOSIS — R7303 Prediabetes: Secondary | ICD-10-CM | POA: Diagnosis not present

## 2022-10-13 DIAGNOSIS — E669 Obesity, unspecified: Secondary | ICD-10-CM

## 2022-10-13 DIAGNOSIS — Z6835 Body mass index (BMI) 35.0-35.9, adult: Secondary | ICD-10-CM | POA: Diagnosis not present

## 2022-10-13 DIAGNOSIS — R632 Polyphagia: Secondary | ICD-10-CM

## 2022-10-13 NOTE — Progress Notes (Signed)
WEIGHT SUMMARY AND BIOMETRICS  Vitals Temp: 98.2 F (36.8 C) BP: 126/77 Pulse Rate: 92 SpO2: 96 %   Anthropometric Measurements Height: 5\' 5"  (1.651 m) Weight: 214 lb (97.1 kg) BMI (Calculated): 35.61 Weight at Last Visit: 215lb Weight Lost Since Last Visit: 1lb Weight Gained Since Last Visit: 0 Starting Weight: 233lb Total Weight Loss (lbs): 19 lb (8.618 kg)   Body Composition  Body Fat %: 35.5 % Fat Mass (lbs): 76.2 lbs Muscle Mass (lbs): 131.2 lbs Total Body Water (lbs): 86 lbs Visceral Fat Rating : 8   Other Clinical Data Fasting: no Labs: no Today's Visit #: 67 Starting Date: 02/06/18    Chief Complaint:   OBESITY [name] is here to discuss her progress with her obesity treatment plan. She is on the the Category 3 Plan and states she is following her eating plan approximately 50 % of the time. She states she is exercising Walking 20 minutes 4 times per week.   Interim History:  [name] was recently ill with COVID-19 infection. She was treated with Azithromycin and Hydrocodone Cough Syrup.  She reports resolution of respiratory sx's, fatigue continues to linger.  She reduced her Visceral Adipose Rating from 9 to 8.  Subjective:   1. Polyphagia She was briefly on Zepbound therapy She was only able to afford 2 months of Zepbounf 2.5mg - after $25 coupon was exhausted-coverage increased to $500/month- cost prohibitive Last dose of Wegovy therapy was May 2024  2. Prediabetes Lab Results  Component Value Date   HGBA1C 5.6 03/25/2022   HGBA1C 5.7 (H) 11/16/2021   HGBA1C 5.6 09/15/2021    A1c improved with lifestyle modifications. She is not currently any antidiabetic medications at present.  Assessment/Plan:   1. Polyphagia Continue to increase protein and limit simple CHO/Sugar intake.  2. Prediabetes Continue to increase protein and limit simple CHO/Sugar intake. Increase daily activity.  3. Obesity, current BMI 35.78  [name] is  currently in the action stage of change. As such, her goal is to continue with weight loss efforts. She has agreed to the Category 3 Plan.   Exercise goals: For substantial health benefits, adults should do at least 150 minutes (2 hours and 30 minutes) a week of moderate-intensity, or 75 minutes (1 hour and 15 minutes) a week of vigorous-intensity aerobic physical activity, or an equivalent combination of moderate- and vigorous-intensity aerobic activity. Aerobic activity should be performed in episodes of at least 10 minutes, and preferably, it should be spread throughout the week.  Behavioral modification strategies: increasing lean protein intake, decreasing simple carbohydrates, increasing vegetables, increasing water intake, no skipping meals, meal planning and cooking strategies, and planning for success.  [name] has agreed to follow-up with our clinic in 4 weeks. She was informed of the importance of frequent follow-up visits to maximize her success with intensive lifestyle modifications for her multiple health conditions.   Check Fasting Labs at next OV  Objective:   Blood pressure 126/77, pulse 92, temperature 98.2 F (36.8 C), height 5\' 5"  (1.651 m), weight 214 lb (97.1 kg), SpO2 96%, unknown if currently breastfeeding. Body mass index is 35.61 kg/m.  General: Cooperative, alert, well developed, in no acute distress. HEENT: Conjunctivae and lids unremarkable. Cardiovascular: Regular rhythm.  Lungs: Normal work of breathing. Neurologic: No focal deficits.   Lab Results  Component Value Date   CREATININE 0.77 03/25/2022   BUN 12 03/25/2022   NA 140 03/25/2022   K 5.1 03/25/2022   CL 106 03/25/2022  CO2 22 03/25/2022   Lab Results  Component Value Date   ALT 12 03/25/2022   AST 14 03/25/2022   ALKPHOS 66 03/25/2022   BILITOT 0.3 03/25/2022   Lab Results  Component Value Date   HGBA1C 5.6 03/25/2022   HGBA1C 5.7 (H) 11/16/2021   HGBA1C 5.6 09/15/2021   HGBA1C 5.7  (H) 02/25/2021   HGBA1C 5.7 (H) 11/05/2020   Lab Results  Component Value Date   INSULIN 13.7 03/25/2022   INSULIN 17.3 11/16/2021   INSULIN 15.0 09/15/2021   INSULIN 16.6 02/25/2021   INSULIN 20.4 11/05/2020   Lab Results  Component Value Date   TSH 1.420 03/25/2022   Lab Results  Component Value Date   CHOL 158 03/25/2022   HDL 48 03/25/2022   LDLCALC 99 03/25/2022   TRIG 52 03/25/2022   CHOLHDL 2.6 07/15/2020   Lab Results  Component Value Date   VD25OH 50.7 03/25/2022   VD25OH 51.9 11/16/2021   VD25OH 56.0 09/15/2021   Lab Results  Component Value Date   WBC 5.3 03/25/2022   HGB 12.5 03/25/2022   HCT 39.4 03/25/2022   MCV 89 03/25/2022   PLT 412 03/25/2022   Lab Results  Component Value Date   IRON 54 09/30/2017   TIBC 373 09/30/2017   FERRITIN 81 09/30/2017    Attestation Statements:   Reviewed by clinician on day of visit: allergies, medications, problem list, medical history, surgical history, family history, social history, and previous encounter notes.  I have reviewed the above documentation for accuracy and completeness, and I agree with the above. -  Ledia Hanford d. Nahmir Zeidman, NP-C

## 2022-11-04 IMAGING — MG MM DIGITAL SCREENING BILAT W/ TOMO AND CAD
8 series · 8 of 24 positions shown · non-contrast
Comparison: Previous exam(s).

CLINICAL DATA: Screening.

EXAM:
DIGITAL SCREENING BILATERAL MAMMOGRAM WITH TOMOSYNTHESIS AND CAD
TECHNIQUE: Bilateral screening digital craniocaudal and mediolateral oblique
mammograms were obtained. Bilateral screening digital breast
tomosynthesis was performed. The images were evaluated with
computer-aided detection.

[R MLO synth-2D]
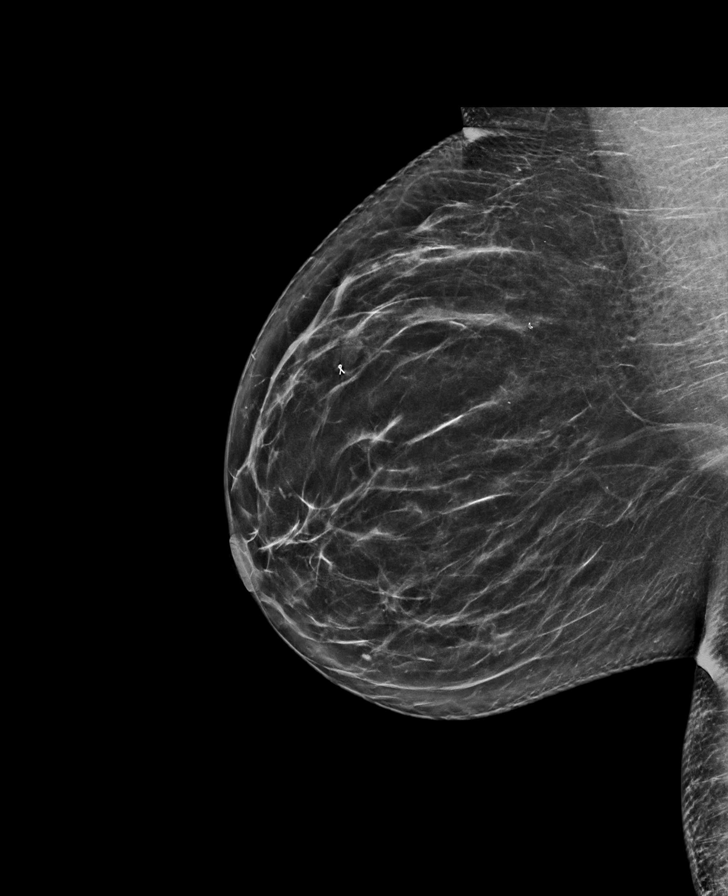

[L CC synth-2D]
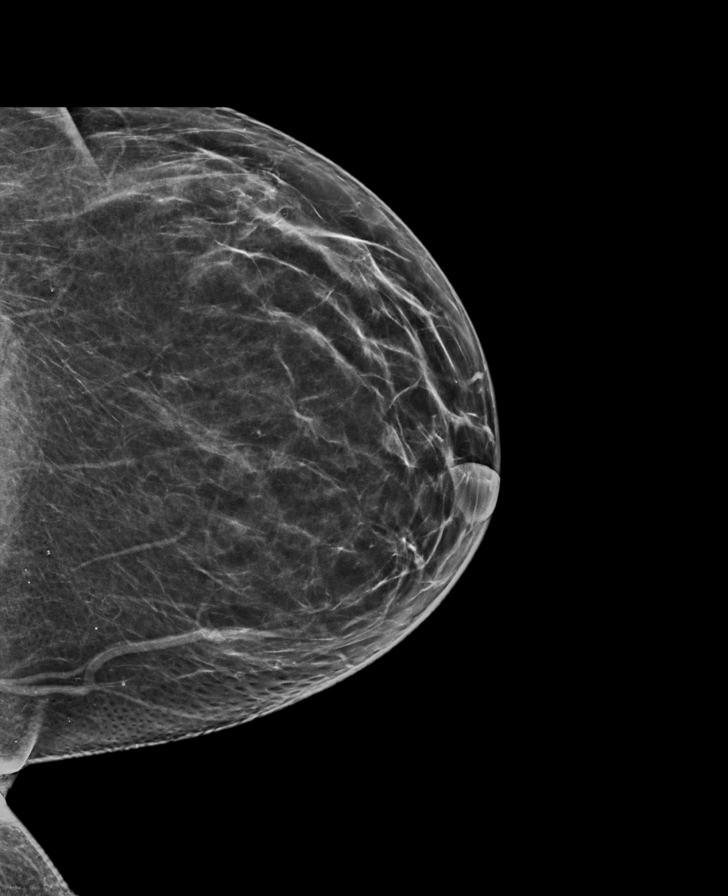

[L MLO synth-2D]
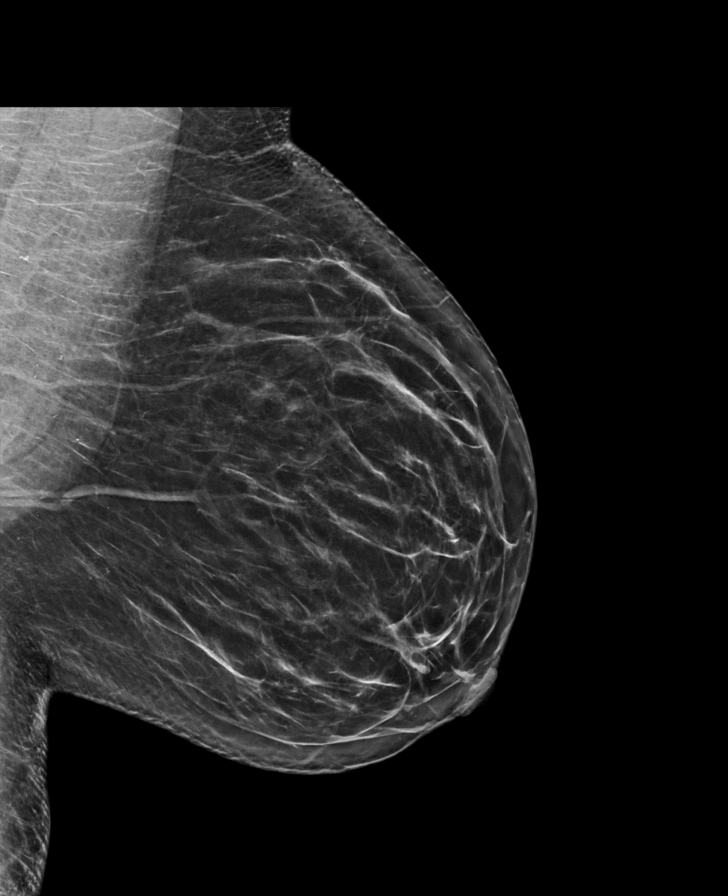

[R CC synth-2D]
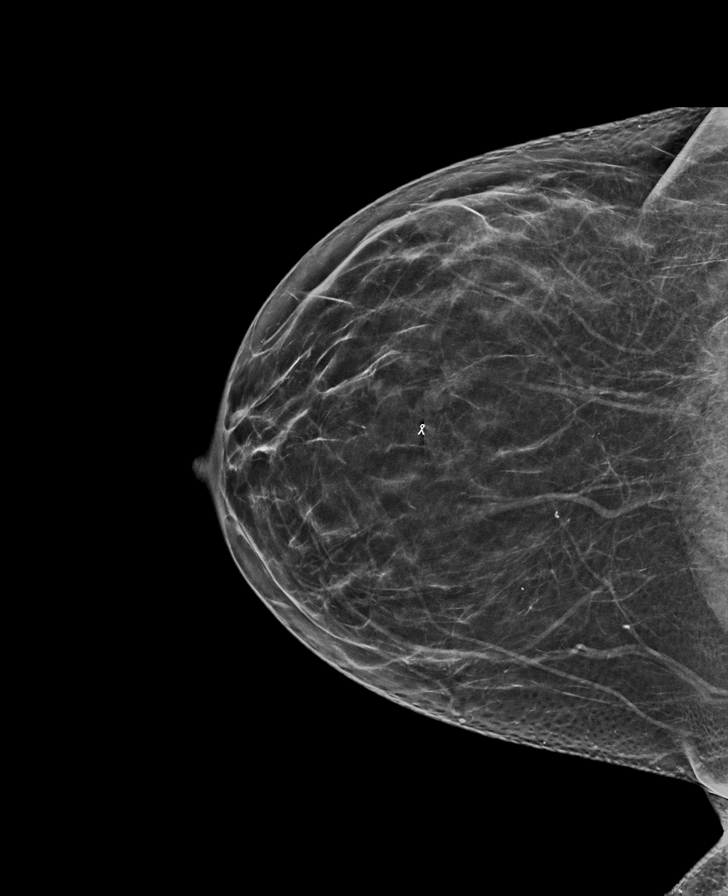

[R MLO tomo · tomo slice 37/74.0]
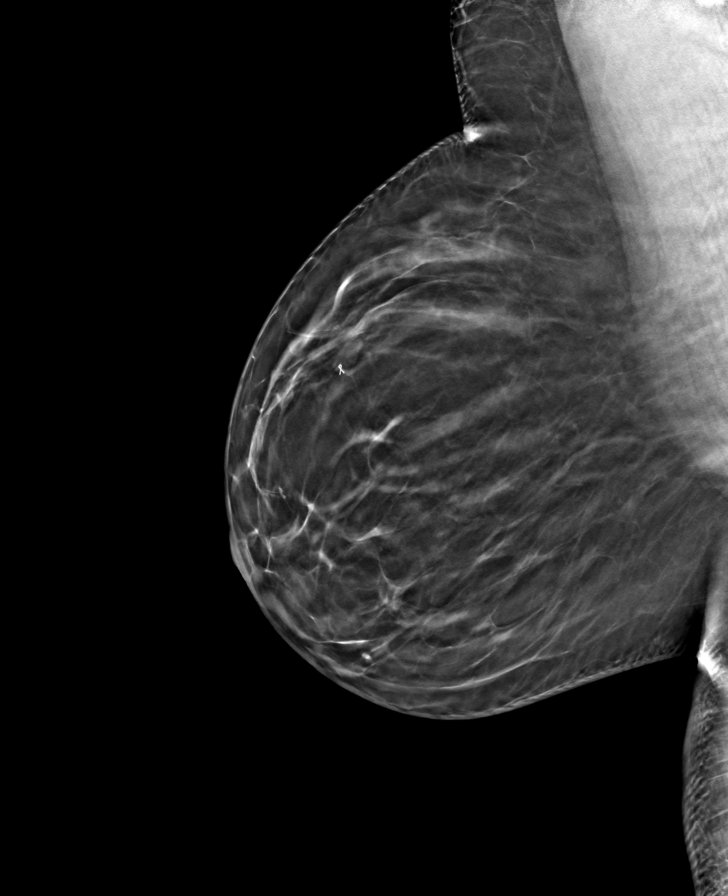

[R CC tomo · tomo slice 35/68.0]
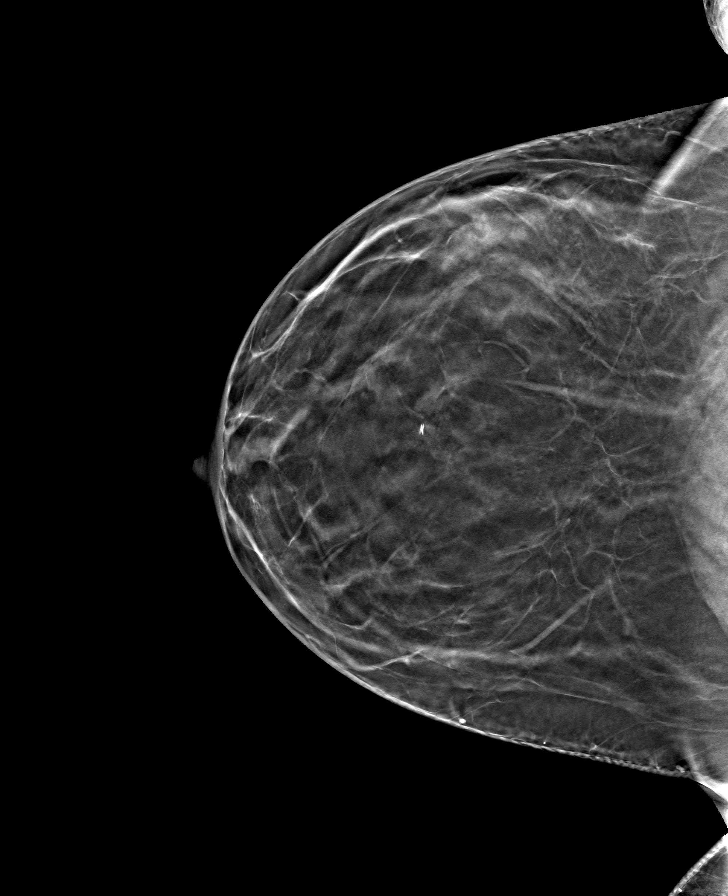

[L MLO tomo · tomo slice 39/76.0]
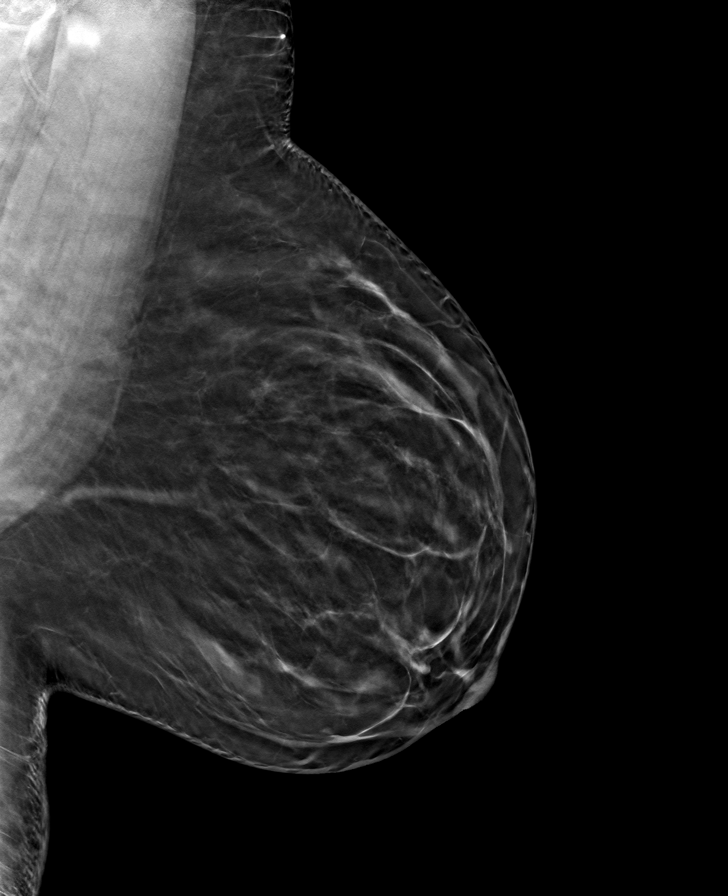

[L CC tomo · tomo slice 35/69.0]
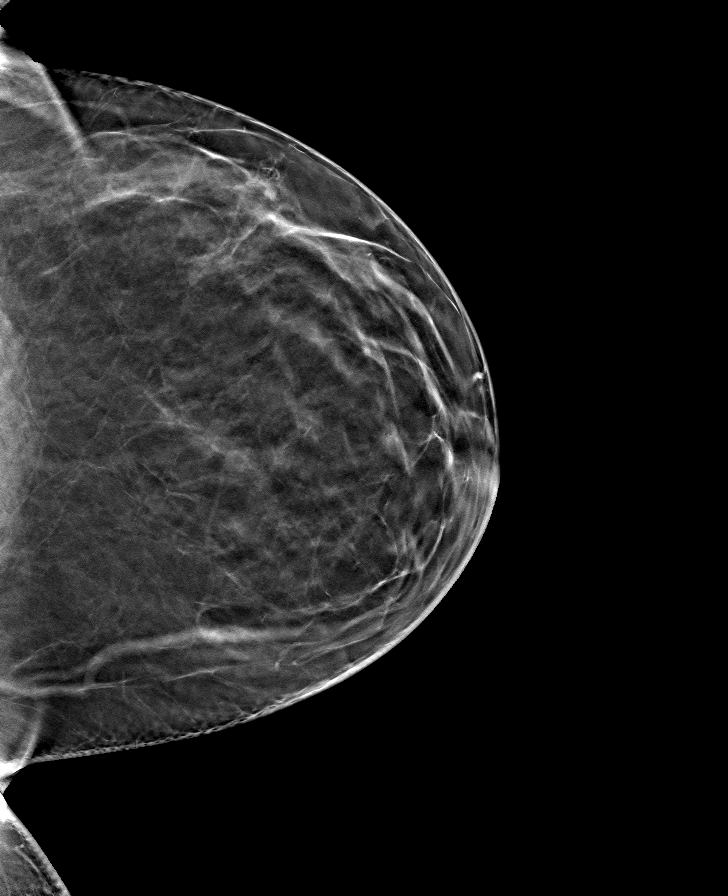

[8 of 24 positions shown; findings below may reference images not displayed]

ACR Breast Density Category b: There are scattered areas of
fibroglandular density.
FINDINGS: There are no findings suspicious for malignancy.
IMPRESSION: No mammographic evidence of malignancy. A result letter of this
screening mammogram will be mailed directly to the patient.

RECOMMENDATION:
Screening mammogram in one year. (Code:51-O-LD2)

BI-RADS CATEGORY  1: Negative.

## 2022-11-17 ENCOUNTER — Ambulatory Visit (INDEPENDENT_AMBULATORY_CARE_PROVIDER_SITE_OTHER): Payer: BC Managed Care – PPO | Admitting: Adult Health

## 2022-12-13 ENCOUNTER — Ambulatory Visit (INDEPENDENT_AMBULATORY_CARE_PROVIDER_SITE_OTHER): Payer: BC Managed Care – PPO | Admitting: Adult Health

## 2022-12-13 ENCOUNTER — Encounter (INDEPENDENT_AMBULATORY_CARE_PROVIDER_SITE_OTHER): Payer: Self-pay | Admitting: Adult Health

## 2022-12-13 VITALS — BP 119/77 | HR 80 | Temp 98.2°F | Ht 65.0 in | Wt 219.0 lb

## 2022-12-13 DIAGNOSIS — E875 Hyperkalemia: Secondary | ICD-10-CM

## 2022-12-13 DIAGNOSIS — E559 Vitamin D deficiency, unspecified: Secondary | ICD-10-CM | POA: Diagnosis not present

## 2022-12-13 DIAGNOSIS — R7303 Prediabetes: Secondary | ICD-10-CM

## 2022-12-13 DIAGNOSIS — E669 Obesity, unspecified: Secondary | ICD-10-CM

## 2022-12-13 DIAGNOSIS — R632 Polyphagia: Secondary | ICD-10-CM | POA: Diagnosis not present

## 2022-12-13 DIAGNOSIS — Z6836 Body mass index (BMI) 36.0-36.9, adult: Secondary | ICD-10-CM

## 2022-12-13 NOTE — Progress Notes (Signed)
WEIGHT SUMMARY AND BIOMETRICS  Vitals Temp: 98.2 F (36.8 C) BP: 119/77 Pulse Rate: 80 SpO2: 100 %   Anthropometric Measurements Height: 5\' 5"  (1.651 m) Weight: 219 lb (99.3 kg) BMI (Calculated): 36.44 Weight at Last Visit: 214lb Weight Lost Since Last Visit: 0 Weight Gained Since Last Visit: 5lb Starting Weight: 233lb Total Weight Loss (lbs): 14 lb (6.35 kg)   Body Composition  Body Fat %: 42.1 % Fat Mass (lbs): 92.4 lbs Muscle Mass (lbs): 120.8 lbs Total Body Water (lbs): 85 lbs Visceral Fat Rating : 10   Other Clinical Data Fasting: no Labs: yes Today's Visit #: 7 Starting Date: 02/06/18    Chief Complaint:   OBESITY [name] is here to discuss her progress with her obesity treatment plan. She is on the the Category 3 Plan and states she is following her eating plan approximately 0 % of the time.  She states she is not currently exercising.   Interim History:  Last OV with [facility] was 10/13/2022 She was eating well and exercising regularly and she reports weight decreased to 209 lbs in mid Oct 2024. Her 43 year old son became acutely ill with pnuemonia- she cared for him for two weeks at home. This derailed her weight loss efforts and the last month she has been unable to exercise or follow prescribed cat 3 meal plan.   Subjective:   1. Hyperkalemia  Latest Reference Range & Units 02/25/21 09:32 03/25/21 14:09 09/15/21 10:32 11/16/21 11:33 03/25/22 09:17  Potassium 3.5 - 5.2 mmol/L 5.3 (H) 4.2 4.5 4.6 5.1  (H): Data is abnormally high  She denies palpitations. She is not on ACE or ARB therapy  2. Prediabetes Lab Results  Component Value Date   HGBA1C 5.6 03/25/2022   HGBA1C 5.7 (H) 11/16/2021   HGBA1C 5.6 09/15/2021    She was previously on Metformin, Wegovy, and Zepbound therapy. She is not on any blood glucose lowering medications She would like to resume injection therapy, however her insurance will not cover   3. Polyphagia Last dose of  Wegovy therapy was May 2024 She was briefly on Zepbound therapy May/June 2024 She was only able to afford 2 months of Zepbound 2.5mg - after $25 coupon was exhausted-coverage increased to $500/month- cost prohibitive  4. Vitamin D deficiency  Latest Reference Range & Units 09/15/21 10:32 11/16/21 11:33 03/25/22 09:17  Vitamin D, 25-Hydroxy 30.0 - 100.0 ng/mL 56.0 51.9 50.7   She is on daily OTC Vit D 3 5,000 international units   Assessment/Plan:   1. Hyperkalemia Check Labs tomorrow  2. Prediabetes Check Labs tomorrow  3. Polyphagia Check Labs tomorrow Consider restarting Metformin therapy   4. Vitamin D deficiency Check Labs tomorrow  5. Obesity, current BMI 36.44  [name] is not currently in the action stage of change. As such, her goal is to get back to weightloss efforts . She has agreed to the Category 3 Plan.   Exercise goals: For substantial health benefits, adults should do at least 150 minutes (2 hours and 30 minutes) a week of moderate-intensity, or 75 minutes (1 hour and 15 minutes) a week of vigorous-intensity aerobic physical activity, or an equivalent combination of moderate- and vigorous-intensity aerobic activity. Aerobic activity should be performed in episodes of at least 10 minutes, and preferably, it should be spread throughout the week.  Behavioral modification strategies: increasing lean protein intake, decreasing simple carbohydrates, increasing vegetables, increasing water intake, meal planning and cooking strategies, keeping healthy foods in the home,  ways to avoid boredom eating, and planning for success.  [name] has agreed to follow-up with our clinic in 4 weeks. She was informed of the importance of frequent follow-up visits to maximize her success with intensive lifestyle modifications for her multiple health conditions.   Check Fasting Labs at next OV  Objective:   Blood pressure 119/77, pulse 80, temperature 98.2 F (36.8 C), height 5\' 5"  (1.651  m), weight 219 lb (99.3 kg), last menstrual period 11/16/2022, SpO2 100%, unknown if currently breastfeeding. Body mass index is 36.44 kg/m.  General: Cooperative, alert, well developed, in no acute distress. HEENT: Conjunctivae and lids unremarkable. Cardiovascular: Regular rhythm.  Lungs: Normal work of breathing. Neurologic: No focal deficits.   Lab Results  Component Value Date   CREATININE 0.77 03/25/2022   BUN 12 03/25/2022   NA 140 03/25/2022   K 5.1 03/25/2022   CL 106 03/25/2022   CO2 22 03/25/2022   Lab Results  Component Value Date   ALT 12 03/25/2022   AST 14 03/25/2022   ALKPHOS 66 03/25/2022   BILITOT 0.3 03/25/2022   Lab Results  Component Value Date   HGBA1C 5.6 03/25/2022   HGBA1C 5.7 (H) 11/16/2021   HGBA1C 5.6 09/15/2021   HGBA1C 5.7 (H) 02/25/2021   HGBA1C 5.7 (H) 11/05/2020   Lab Results  Component Value Date   INSULIN 13.7 03/25/2022   INSULIN 17.3 11/16/2021   INSULIN 15.0 09/15/2021   INSULIN 16.6 02/25/2021   INSULIN 20.4 11/05/2020   Lab Results  Component Value Date   TSH 1.420 03/25/2022   Lab Results  Component Value Date   CHOL 158 03/25/2022   HDL 48 03/25/2022   LDLCALC 99 03/25/2022   TRIG 52 03/25/2022   CHOLHDL 2.6 07/15/2020   Lab Results  Component Value Date   VD25OH 50.7 03/25/2022   VD25OH 51.9 11/16/2021   VD25OH 56.0 09/15/2021   Lab Results  Component Value Date   WBC 5.3 03/25/2022   HGB 12.5 03/25/2022   HCT 39.4 03/25/2022   MCV 89 03/25/2022   PLT 412 03/25/2022   Lab Results  Component Value Date   IRON 54 09/30/2017   TIBC 373 09/30/2017   FERRITIN 81 09/30/2017    Attestation Statements:   Reviewed by clinician on day of visit: allergies, medications, problem list, medical history, surgical history, family history, social history, and previous encounter notes.  Time spent on visit including pre-visit chart review and post-visit care and charting was 28 minutes.   I have reviewed the  above documentation for accuracy and completeness, and I agree with the above. -  Shenita Trego d. Berdell Hostetler, NP-C

## 2022-12-14 ENCOUNTER — Other Ambulatory Visit (INDEPENDENT_AMBULATORY_CARE_PROVIDER_SITE_OTHER): Payer: Self-pay | Admitting: Adult Health

## 2022-12-14 DIAGNOSIS — Z0000 Encounter for general adult medical examination without abnormal findings: Secondary | ICD-10-CM

## 2022-12-14 DIAGNOSIS — E875 Hyperkalemia: Secondary | ICD-10-CM

## 2022-12-14 DIAGNOSIS — E559 Vitamin D deficiency, unspecified: Secondary | ICD-10-CM

## 2022-12-14 DIAGNOSIS — R7303 Prediabetes: Secondary | ICD-10-CM

## 2022-12-21 LAB — COMPREHENSIVE METABOLIC PANEL
ALT: 21 [IU]/L (ref 0–32)
AST: 26 [IU]/L (ref 0–40)
Albumin: 4.2 g/dL (ref 3.9–4.9)
Alkaline Phosphatase: 66 [IU]/L (ref 44–121)
BUN/Creatinine Ratio: 16 (ref 9–23)
BUN: 12 mg/dL (ref 6–24)
Bilirubin Total: 0.3 mg/dL (ref 0.0–1.2)
CO2: 21 mmol/L (ref 20–29)
Calcium: 9.1 mg/dL (ref 8.7–10.2)
Chloride: 108 mmol/L — ABNORMAL HIGH (ref 96–106)
Creatinine, Ser: 0.75 mg/dL (ref 0.57–1.00)
Globulin, Total: 2.7 g/dL (ref 1.5–4.5)
Glucose: 125 mg/dL — ABNORMAL HIGH (ref 70–99)
Potassium: 4.2 mmol/L (ref 3.5–5.2)
Sodium: 143 mmol/L (ref 134–144)
Total Protein: 6.9 g/dL (ref 6.0–8.5)
eGFR: 101 mL/min/{1.73_m2} (ref >59)

## 2022-12-21 LAB — HEMOGLOBIN A1C
Est. average glucose Bld gHb Est-mCnc: 111 mg/dL
Hgb A1c MFr Bld: 5.5 % (ref 4.8–5.6)

## 2022-12-21 LAB — LIPID PANEL
Chol/HDL Ratio: 2.7 {ratio} (ref 0.0–4.4)
Cholesterol, Total: 150 mg/dL (ref 100–199)
HDL: 55 mg/dL (ref >39)
LDL Chol Calc (NIH): 85 mg/dL (ref 0–99)
Triglycerides: 47 mg/dL (ref 0–149)
VLDL Cholesterol Cal: 10 mg/dL (ref 5–40)

## 2022-12-21 LAB — VITAMIN D 25 HYDROXY (VIT D DEFICIENCY, FRACTURES): Vit D, 25-Hydroxy: 55.6 ng/mL (ref 30.0–100.0)

## 2022-12-21 LAB — INSULIN, RANDOM: INSULIN: 29.1 u[IU]/mL — ABNORMAL HIGH (ref 2.6–24.9)

## 2023-01-05 NOTE — Progress Notes (Signed)
She   Patient Care Team: Margaree Mackintosh, MD as PCP - General (Internal Medicine) Karl Pock.Thomasena Edis as Doctor, general practice Health)  Visit Date: 01/05/23  Subjective:    Patient ID: [name] , Female   DOB: [birth year], 42 y.o.    MRN: [id number]   42 y.o. Female presents today for cough,    Past Medical History:  Diagnosis Date   Class 2 obesity due to excess calories with body mass index (BMI) of 35.0 to 35.9 in adult 02/08/2018   GERD (gastroesophageal reflux disease)    not currently   Gestational diabetes mellitus (GDM), antepartum    Heart murmur    HSV infection    Leaky heart valve    Prediabetes    Vitamin D deficiency      Family History  Problem Relation Age of Onset   Hypertension Mother    Sleep apnea Mother    Obesity Mother    Diabetes Father    High Cholesterol Father    Cancer Father    Hypertension Brother    Diabetes Brother     Social History   Social History Narrative   Not on file      ROS      Objective:   Vitals: LMP 11/16/2022    Physical Exam    Results:   Studies obtained and personally reviewed by me:  This   Labs:       Component Value Date/Time   NA 143 12/20/2022 0908   K 4.2 12/20/2022 0908   CL 108 (H) 12/20/2022 0908   CO2 21 12/20/2022 0908   GLUCOSE 125 (H) 12/20/2022 0908   GLUCOSE 88 03/18/2020 1636   BUN 12 12/20/2022 0908   CREATININE 0.75 12/20/2022 0908   CREATININE 0.73 03/18/2020 1636   CALCIUM 9.1 12/20/2022 0908   PROT 6.9 12/20/2022 0908   ALBUMIN 4.2 12/20/2022 0908   AST 26 12/20/2022 0908   ALT 21 12/20/2022 0908   ALKPHOS 66 12/20/2022 0908   BILITOT 0.3 12/20/2022 0908   GFRNONAA 103 03/18/2020 1636   GFRAA 119 03/18/2020 1636     Lab Results  Component Value Date   WBC 5.3 03/25/2022   HGB 12.5 03/25/2022   HCT 39.4 03/25/2022   MCV 89 03/25/2022   PLT 412 03/25/2022    Lab Results  Component Value Date   CHOL 150 12/20/2022   HDL 55 12/20/2022   LDLCALC  85 12/20/2022   TRIG 47 12/20/2022   CHOLHDL 2.7 12/20/2022    Lab Results  Component Value Date   HGBA1C 5.5 12/20/2022     Lab Results  Component Value Date   TSH 1.420 03/25/2022          Assessment & Plan:   Acute bilateral otitis media  Plan: Zithromax Z-PAK take as directed    I,Alexander Ruley,acting as a scribe for Margaree Mackintosh, MD.,have documented all relevant documentation on the behalf of Margaree Mackintosh, MD,as directed by  Margaree Mackintosh, MD while in the presence of Margaree Mackintosh, MD.   I, Margaree Mackintosh, MD, have reviewed all documentation for this visit. The documentation on 01/16/23 for the exam, diagnosis, procedures, and orders are all accurate and complete.

## 2023-01-06 ENCOUNTER — Encounter: Payer: Self-pay | Admitting: Neurology

## 2023-01-06 ENCOUNTER — Encounter: Payer: Self-pay | Admitting: Internal Medicine

## 2023-01-06 ENCOUNTER — Ambulatory Visit (INDEPENDENT_AMBULATORY_CARE_PROVIDER_SITE_OTHER): Payer: BC Managed Care – PPO | Admitting: Neurology

## 2023-01-06 ENCOUNTER — Ambulatory Visit (INDEPENDENT_AMBULATORY_CARE_PROVIDER_SITE_OTHER): Payer: BC Managed Care – PPO | Admitting: Internal Medicine

## 2023-01-06 VITALS — BP 102/80 | HR 74 | Temp 98.5°F | Ht 65.0 in | Wt 228.0 lb

## 2023-01-06 VITALS — BP 123/81 | Ht 65.0 in | Wt 228.0 lb

## 2023-01-06 DIAGNOSIS — M25511 Pain in right shoulder: Secondary | ICD-10-CM | POA: Diagnosis not present

## 2023-01-06 DIAGNOSIS — H6692 Otitis media, unspecified, left ear: Secondary | ICD-10-CM

## 2023-01-06 DIAGNOSIS — G5610 Other lesions of median nerve, unspecified upper limb: Secondary | ICD-10-CM

## 2023-01-06 DIAGNOSIS — G5620 Lesion of ulnar nerve, unspecified upper limb: Secondary | ICD-10-CM

## 2023-01-06 DIAGNOSIS — R200 Anesthesia of skin: Secondary | ICD-10-CM

## 2023-01-06 DIAGNOSIS — H6693 Otitis media, unspecified, bilateral: Secondary | ICD-10-CM | POA: Diagnosis not present

## 2023-01-06 MED ORDER — AZITHROMYCIN 250 MG PO TABS: ORAL_TABLET | ORAL | 0 refills | Status: AC

## 2023-01-06 MED ORDER — METHYLPREDNISOLONE 4 MG PO TABS: ORAL_TABLET | ORAL | 0 refills | Status: DC

## 2023-01-06 MED ORDER — FLUCONAZOLE 150 MG PO TABS: 150.0000 mg | ORAL_TABLET | Freq: Once | ORAL | 0 refills | Status: AC

## 2023-01-06 NOTE — Progress Notes (Signed)
[facility]  PATIENT: [name] DOB: 1980/03/21  REFERRING DOCTOR OR PCP:  Marlan Palau, MD; Rodolph Bong, MD SOURCE: Patient, notes from orthopedics, imaging reports, NCV/EMG study reviewed  _________________________________   HISTORICAL  CHIEF COMPLAINT:  Chief Complaint  Patient presents with   New Patient (Initial Visit)    Patient in room # 10 and alone. Patient states she has pain and weakness in her right shoulder and arm.    HISTORY OF PRESENT ILLNESS:  I had the pleasure of seeing patient, [name], at [facility] for neurologic consultation regarding her right shoulder pain and hand numbness  She is a 42 yo woman with right shoulder pain.  Pain started about one year ago and has been progressive.   She feels the aching discomfort that is occasional sharp.  She feels the pain over the shoulder and scapula region.  There is milder pain in the adjacent neck.  The shoulder pain is worse doing some household chores and reaching up and out.   When more severe, the pain will go further down the arm but usually not below the elbow.  She is taking Naproxen and using Salonpas lidocaine patches with some benefit. .    She also notes a little bit of tingling at times in her fingers but this is not too painful.    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    The more symptomatic right arm was normal.   The EMG was normal.  Specifically, there was not evidence of radiculopathy.  Plain films of neck and right shoulder were read as normal.   AN MRI of the shoulder is scheduled for early January.     REVIEW OF SYSTEMS: Constitutional: No fevers, chills, sweats, or change in appetite Eyes: No visual changes, double vision, eye pain Ear, nose and throat: No hearing loss, ear pain, nasal congestion, sore throat Cardiovascular: No chest pain,  palpitations Respiratory:  No shortness of breath at rest or with exertion.   No wheezes GastrointestinaI: No nausea, vomiting, diarrhea, abdominal pain, fecal incontinence Genitourinary:  No dysuria, urinary retention or frequency.  No nocturia. Musculoskeletal:  as above Integumentary: No rash, pruritus, skin lesions Neurological: as above Psychiatric: No depression at this time.  No anxiety Endocrine: No palpitations, diaphoresis, change in appetite, change in weigh or increased thirst Hematologic/Lymphatic:  No anemia, purpura, petechiae. Allergic/Immunologic: No itchy/runny eyes, nasal congestion, recent allergic reactions, rashes  ALLERGIES: No Known Allergies  HOME MEDICATIONS:  Current Outpatient Medications:    buPROPion (WELLBUTRIN XL) 300 MG 24 hr tablet, Take 1 tablet (300 mg total) by mouth daily., Disp: 90 tablet, Rfl: 3   Cholecalciferol (VITAMIN D3) 125 MCG (5000 UT) CAPS, Take 1 capsule (5,000 Units total) by mouth daily., Disp: 30 capsule, Rfl: 0   FLUoxetine (PROZAC) 20 MG tablet, TAKE 1 TABLET(20 MG) BY MOUTH DAILY, Disp: 90 tablet, Rfl: 0   methylPREDNISolone (MEDROL) 4 MG tablet, Taper from 6 pills po for one day to 1 pill po the last day over 6 days, Disp: 21 tablet, Rfl: 0   Multiple Vitamin (MULTIVITAMIN WITH MINERALS) TABS tablet, Take 1 tablet by mouth daily., Disp: , Rfl:    tretinoin (RETIN-A) 0.025 % cream, APPLY A PEA SIZE AMOUNT TO FACE ONCE DAILY IN THE EVENING, Disp: , Rfl:    azithromycin (ZITHROMAX) 250 MG tablet, Take 2 tablets on day 1, then 1 tablet daily on  days 2 through 5, Disp: 6 tablet, Rfl: 0   fluconazole (DIFLUCAN) 150 MG tablet, Take 1 tablet (150 mg total) by mouth once for 1 dose., Disp: 1 tablet, Rfl: 0  PAST MEDICAL HISTORY: Past Medical History:  Diagnosis Date   Class 2 obesity due to excess calories with body mass index (BMI) of 35.0 to 35.9 in adult 02/08/2018   GERD (gastroesophageal reflux disease)    not currently    Gestational diabetes mellitus (GDM), antepartum    Heart murmur    HSV infection    Leaky heart valve    Prediabetes    Vitamin D deficiency     PAST SURGICAL HISTORY: Past Surgical History:  Procedure Laterality Date   CESAREAN SECTION     CESAREAN SECTION N/A 04/21/2015   Procedure: CESAREAN SECTION;  Surgeon: Hoover Browns, MD;  Location: [facility];  Service: Obstetrics;  Laterality: N/A;   DILATION AND EVACUATION N/A 08/09/2013   Procedure: DILATATION AND EVACUATION;  Surgeon: Michael Litter, MD;  Location: [facility];  Service: Gynecology;  Laterality: N/A;   excision of pilonidal cyst     EXCISION OF SKIN TAG Right 08/09/2013   Procedure: EXCISION OF SKIN TAG on right buttock;  Surgeon: Michael Litter, MD;  Location: [facility];  Service: Gynecology;  Laterality: Right;  This procedure was added on after the patient had gone to sleep (as per Dr. Normand Sloop)   OPERATIVE ULTRASOUND N/A 08/09/2013   Procedure: OPERATIVE ULTRASOUND;  Surgeon: Michael Litter, MD;  Location: [facility];  Service: Gynecology;  Laterality: N/A;   TONSILLECTOMY      FAMILY HISTORY: Family History  Problem Relation Age of Onset   Hypertension Mother    Sleep apnea Mother    Obesity Mother    Diabetes Father    High Cholesterol Father    Cancer Father    Hypertension Brother    Diabetes Brother     SOCIAL HISTORY: Social History   Socioeconomic History   Marital status: Married    Spouse name: Deonna Krummel   Number of children: 2   Years of education: Not on file   Highest education level: Bachelor's degree (e.g., BA, AB, BS)  Occupational History   Occupation: Water engineer  Tobacco Use   Smoking status: Never   Smokeless tobacco: Never  Substance and Sexual Activity   Alcohol use: Yes    Comment: rarely   Drug use: No   Sexual activity: Yes    Birth control/protection: None  Other Topics Concern   Not on file  Social History Narrative   Not on file   Social Drivers of Health   Financial  Resource Strain: Not on file  Food Insecurity: Not on file  Transportation Needs: Not on file  Physical Activity: Not on file  Stress: Not on file  Social Connections: Not on file  Intimate Partner Violence: Not on file       PHYSICAL EXAM  Vitals:   01/06/23 0940  BP: 123/81  Weight: 228 lb (103.4 kg)  Height: 5\' 5"  (1.651 m)    Body mass index is 37.94 kg/m.   General: The patient is well-developed and well-nourished and in no acute distress  HEENT:  Head is [place]/AT.  Sclera are anicteric.    Neck: No carotid bruits are noted.  She had mild neck tenderness on the right close to the shoulder  Cardiovascular: The heart has a regular rate and rhythm with a normal S1 and S2. There  were no murmurs, gallops or rubs.    Skin: Extremities are without rash or  edema.  Musculoskeletal: She had mild tenderness over the right subacromial bursa.  There was increased pain in the shoulder region with the arm elevated and externally rotated.  She could scratch the middle back without increasing pain.  No tenderness at the glenohumeral joint.  She had Tinel signs at both wrist and left elbow.  She had a Phalen sign bilaterally  Neurologic Exam  Mental status: The patient is alert and oriented x 3 at the time of the examination. The patient has apparent normal recent and remote memory, with an apparently normal attention span and concentration ability.   Speech is normal.  Cranial nerves: Extraocular movements are full.  Facial strength and sensation was normal.. No obvious hearing deficits are noted.  Motor:  Muscle bulk is normal.   Tone is normal. Strength is  5 / 5 in all 4 extremities.   Sensory: She reported mild altered sensation over the hypothenar eminences bilaterally, more on the left.  Sensation was normal otherwise.  Coordination: Cerebellar testing reveals good finger-nose-finger and heel-to-shin bilaterally.  Gait and station: Station is normal.   Gait is normal. Tandem  gait is normal. Romberg is negative.   Reflexes: Deep tendon reflexes are symmetric and normal bilaterally.      DIAGNOSTIC DATA (LABS, IMAGING, TESTING) - I reviewed patient records, labs, notes, testing and imaging myself where available.  Lab Results  Component Value Date   WBC 5.3 03/25/2022   HGB 12.5 03/25/2022   HCT 39.4 03/25/2022   MCV 89 03/25/2022   PLT 412 03/25/2022      Component Value Date/Time   NA 143 12/20/2022 0908   K 4.2 12/20/2022 0908   CL 108 (H) 12/20/2022 0908   CO2 21 12/20/2022 0908   GLUCOSE 125 (H) 12/20/2022 0908   GLUCOSE 88 03/18/2020 1636   BUN 12 12/20/2022 0908   CREATININE 0.75 12/20/2022 0908   CREATININE 0.73 03/18/2020 1636   CALCIUM 9.1 12/20/2022 0908   PROT 6.9 12/20/2022 0908   ALBUMIN 4.2 12/20/2022 0908   AST 26 12/20/2022 0908   ALT 21 12/20/2022 0908   ALKPHOS 66 12/20/2022 0908   BILITOT 0.3 12/20/2022 0908   GFRNONAA 103 03/18/2020 1636   GFRAA 119 03/18/2020 1636   Lab Results  Component Value Date   CHOL 150 12/20/2022   HDL 55 12/20/2022   LDLCALC 85 12/20/2022   TRIG 47 12/20/2022   CHOLHDL 2.7 12/20/2022   Lab Results  Component Value Date   HGBA1C 5.5 12/20/2022   Lab Results  Component Value Date   VITAMINB12 400 02/25/2021   Lab Results  Component Value Date   TSH 1.420 03/25/2022       ASSESSMENT AND PLAN  Right shoulder pain, unspecified chronicity  Arm numbness  Ulnar neuropathy, unspecified laterality  Median nerve neuropathy, unspecified laterality   In summary, Ms. Sol is a 42 year old woman with progressive pain predominantly in the right shoulder who also notes some tingling in her hands.  She denies significant weakness.  On exam, she has increased shoulder pain with reaching up and externally rotating.  She has mild tenderness over the subacromial bursa but no tenderness at the glenohumeral joint.  She has mild Tinel signs but normal strength in the hands.  A primary  shoulder issue, such as rotator cuff or bursitis, would best explain her more severe shoulder pain.  She could have mild superimposed  ulnar and median neuropathies but these would be unlikely to be playing any role in her shoulder pain.  Although the NCV study showed a moderately severe ulnar neuropathy at the left elbow, exam would be more consistent with mild.  She reports that she has an MRI of the shoulder pending.  I prescribed her a steroid pack.  Since she has an MRI of the shoulder pending I held off on a subacromial bursa injection but, if other pathology is not found on MRI, a bursa injection may be helpful.  She will return to see me as needed if she has significant new or worsening neurologic symptoms   Delrae Hagey A. Epimenio Foot, MD, [facility] 01/06/2023, 12:56 PM Certified in Neurology, Clinical Neurophysiology, Sleep Medicine and Neuroimaging  [facility] [address] [place], [place] [address] [phone]

## 2023-01-06 NOTE — Progress Notes (Signed)
Patient Care Team: Margaree Mackintosh, MD as PCP - General (Internal Medicine) Karl Pock.Thomasena Edis as Doctor, general practice Health)  Visit Date: 01/06/23  Subjective:    Patient ID: [name] , Female   DOB: [birth year], 42 y.o.    MRN: [id number]   42 y.o. Female presents today for sick visit.   Reports that Tuesday she began to have congestion that has worsened over the week. Her daughter had PNA 2 weeks ago. She has tested herself for Covid at home with a NEG result. Endorses a mild headache, right ear aching, and expectoration of yellow phlegm. Denies sore throat, fever/chills, shaking, nausea, vomiting, or diarrhea.  Past Medical History:  Diagnosis Date   Class 2 obesity due to excess calories with body mass index (BMI) of 35.0 to 35.9 in adult 02/08/2018   GERD (gastroesophageal reflux disease)    not currently   Gestational diabetes mellitus (GDM), antepartum    Heart murmur    HSV infection    Leaky heart valve    Prediabetes    Vitamin D deficiency      Family History  Problem Relation Age of Onset   Hypertension Mother    Sleep apnea Mother    Obesity Mother    Diabetes Father    High Cholesterol Father    Cancer Father    Hypertension Brother    Diabetes Brother     Social history: Married.  2 children.  Husband is a Emergency planning/management officer.  Patient works at [employer].     Review of Systems  Constitutional:  Negative for chills, fever and malaise/fatigue.  HENT:  Positive for congestion (with expectoration of yellow phlegm).   Eyes:  Negative for blurred vision.  Respiratory:  Negative for cough and shortness of breath.   Cardiovascular:  Negative for chest pain, palpitations and leg swelling.  Gastrointestinal:  Negative for diarrhea, nausea and vomiting.  Musculoskeletal:  Negative for back pain.  Skin:  Negative for rash.  Neurological:  Positive for headaches (slight). Negative for tremors and loss of consciousness.        Objective:    Vitals: BP 102/80   Pulse 74   Temp 98.5 F (36.9 C) (Temporal)   Ht 5\' 5"  (1.651 m)   Wt 228 lb (103.4 kg)   LMP 11/16/2022   SpO2 98%   BMI 37.94 kg/m    Physical Exam Constitutional:      General: She is not in acute distress.    Appearance: Normal appearance.  HENT:     Head: Normocephalic and atraumatic.     Comments: Anterior lymph-nodes swollen, slight prominent.     Right Ear: Tympanic membrane is injected (centrally).     Ears:     Comments: Left ear red, injected centrally, and pink Right ear dull, not as red as left, also pink Neurological:     Mental Status: She is alert.       Results:   Studies obtained and personally reviewed by me:   Labs:       Component Value Date/Time   NA 143 12/20/2022 0908   K 4.2 12/20/2022 0908   CL 108 (H) 12/20/2022 0908   CO2 21 12/20/2022 0908   GLUCOSE 125 (H) 12/20/2022 0908   GLUCOSE 88 03/18/2020 1636   BUN 12 12/20/2022 0908   CREATININE 0.75 12/20/2022 0908   CREATININE 0.73 03/18/2020 1636   CALCIUM 9.1 12/20/2022 0908   PROT 6.9 12/20/2022 0908  ALBUMIN 4.2 12/20/2022 0908   AST 26 12/20/2022 0908   ALT 21 12/20/2022 0908   ALKPHOS 66 12/20/2022 0908   BILITOT 0.3 12/20/2022 0908   GFRNONAA 103 03/18/2020 1636   GFRAA 119 03/18/2020 1636     Lab Results  Component Value Date   WBC 5.3 03/25/2022   HGB 12.5 03/25/2022   HCT 39.4 03/25/2022   MCV 89 03/25/2022   PLT 412 03/25/2022    Lab Results  Component Value Date   CHOL 150 12/20/2022   HDL 55 12/20/2022   LDLCALC 85 12/20/2022   TRIG 47 12/20/2022   CHOLHDL 2.7 12/20/2022    Lab Results  Component Value Date   HGBA1C 5.5 12/20/2022     Lab Results  Component Value Date   TSH 1.420 03/25/2022       Assessment & Plan:   Otitis Media: Prescribed 250 mg ZPAK - take 2 tablets day 1 and 1 tablet day 2-5. Sending in 150 mg Diflucan in case of yeast infection. If ears begin to feel congested, you can take a decongestant (I  recommend Dayquil). Contact us if symptoms worsen/do not improve after 5-7 days.    I,Emily Lagle,acting as a Neurosurgeon for Margaree Mackintosh, MD.,have documented all relevant documentation on the behalf of Margaree Mackintosh, MD,as directed by  Margaree Mackintosh, MD while in the presence of Margaree Mackintosh, MD.   I, Margaree Mackintosh, MD, have reviewed all documentation for this visit. The documentation on 01/16/23 for the exam, diagnosis, procedures, and orders are all accurate and complete.

## 2023-01-13 ENCOUNTER — Ambulatory Visit (INDEPENDENT_AMBULATORY_CARE_PROVIDER_SITE_OTHER): Payer: BC Managed Care – PPO | Admitting: Adult Health

## 2023-01-16 NOTE — Patient Instructions (Signed)
You have been diagnosed with acute bilateral otitis media.  Please take Zithromax Z-PAK 2 tabs day 1 followed by 1 tab days 2 through 5.  Also sending in Diflucan 150 mg tablet in case you develop a yeast infection while on antibiotics.  May take a decongestant such as DayQuil if needed.  Contact us if symptoms worsen or not improving after 5 to 7 days.  Rest and stay well-hydrated.

## 2023-02-09 ENCOUNTER — Ambulatory Visit (INDEPENDENT_AMBULATORY_CARE_PROVIDER_SITE_OTHER): Payer: 59 | Admitting: Adult Health

## 2023-02-09 ENCOUNTER — Other Ambulatory Visit (INDEPENDENT_AMBULATORY_CARE_PROVIDER_SITE_OTHER): Payer: Self-pay | Admitting: Adult Health

## 2023-02-09 ENCOUNTER — Ambulatory Visit (INDEPENDENT_AMBULATORY_CARE_PROVIDER_SITE_OTHER): Payer: BC Managed Care – PPO | Admitting: Adult Health

## 2023-02-09 ENCOUNTER — Encounter (INDEPENDENT_AMBULATORY_CARE_PROVIDER_SITE_OTHER): Payer: Self-pay | Admitting: Adult Health

## 2023-02-09 VITALS — BP 129/86 | HR 77 | Temp 98.3°F | Ht 65.0 in | Wt 229.0 lb

## 2023-02-09 DIAGNOSIS — E559 Vitamin D deficiency, unspecified: Secondary | ICD-10-CM | POA: Diagnosis not present

## 2023-02-09 DIAGNOSIS — E669 Obesity, unspecified: Secondary | ICD-10-CM

## 2023-02-09 DIAGNOSIS — Z0000 Encounter for general adult medical examination without abnormal findings: Secondary | ICD-10-CM

## 2023-02-09 DIAGNOSIS — R7303 Prediabetes: Secondary | ICD-10-CM

## 2023-02-09 DIAGNOSIS — E875 Hyperkalemia: Secondary | ICD-10-CM

## 2023-02-09 DIAGNOSIS — Z6838 Body mass index (BMI) 38.0-38.9, adult: Secondary | ICD-10-CM

## 2023-02-09 MED ORDER — METFORMIN HCL 500 MG PO TABS: ORAL_TABLET | ORAL | 0 refills | Status: DC

## 2023-02-09 NOTE — Progress Notes (Signed)
WEIGHT SUMMARY AND BIOMETRICS  Vitals Temp: 98.3 F (36.8 C) BP: 129/86 Pulse Rate: 77 SpO2: 99 %   Anthropometric Measurements Height: 5\' 5"  (1.651 m) Weight: 229 lb (103.9 kg) BMI (Calculated): 38.11 Weight at Last Visit: 219lb Weight Lost Since Last Visit: 0 Weight Gained Since Last Visit: 10lb Starting Weight: 233lb Total Weight Loss (lbs): 4 lb (1.814 kg)   Body Composition  Body Fat %: 43.3 % Fat Mass (lbs): 99.2 lbs Muscle Mass (lbs): 123.4 lbs Total Body Water (lbs): 88.4 lbs Visceral Fat Rating : 11   Other Clinical Data Fasting: no Labs: no Today's Visit #: 69 Starting Date: 02/06/18    Chief Complaint:   OBESITY [name] is here to discuss her progress with her obesity treatment plan. She is on the the Category 3 Plan and states she is following her eating plan approximately 0 % of the time.  She states she is exercising: None.   Interim History:  Last OV at [facility] was on 12/14/2022 She cancelled Dec 2024 OV due to bilateral otitis media She has not been following the prescribed eating plan or exercising the last 4-6 weeks.  She has been challenged to meal plan and prep, requiring her to consume foods off plan.  Her 28 year old daughter "[name]" plays on a competitive volleyball team and travel for tournaments begin this weekend!  She is experiencing increase in chronic pain of R shoulder, bilateral elbows, and bilateral hands. She has known bilateral carpal tunnel syndrome.  She is working closely with [facility] to address various musculoskeletal concerns.   Subjective:   1. Hyperkalemia Discussed Labs  Latest Reference Range & Units 12/20/22 09:08  Potassium 3.5 - 5.2 mmol/L 4.2   K+ level is normal  2. Vitamin D deficiency Discussed Labs  Latest Reference Range & Units 12/20/22 09:08  Vitamin D, 25-Hydroxy 30.0 - 100.0 ng/mL 55.6   Level at goal  3. Healthcare maintenance Discussed Labs Lipid Panel     Component  Value Date/Time   CHOL 150 12/20/2022 0908   TRIG 47 12/20/2022 0908   HDL 55 12/20/2022 0908   CHOLHDL 2.7 12/20/2022 0908   CHOLHDL 3.4 09/27/2017 0913   LDLCALC 85 12/20/2022 0908   LDLCALC 88 09/27/2017 0913   LABVLDL 10 12/20/2022 0908   The 10-year ASCVD risk score (Arnett DK, et al., 2019) is: 1.6%   Values used to calculate the score:     Age: 43 years     Sex: Female     Is Non-Hispanic African American: Yes     Diabetic: Yes     Tobacco smoker: No     Systolic Blood Pressure: 129 mmHg     Is BP treated: No     HDL Cholesterol: 55 mg/dL     Total Cholesterol: 150 mg/dL   Lipid panel at goal and ASCVD risk start acceptab;e  4. Prediabetes Discussed Labs  Latest Reference Range & Units 12/20/22 09:08  Glucose 70 - 99 mg/dL 409 (H)  Hemoglobin W1X 4.8 - 5.6 % 5.5  Est. average glucose Bld gHb Est-mCnc mg/dL 914  INSULIN 2.6 - 78.2 uIU/mL 29.1 (H)  (H): Data is abnormally high  Latest Reference Range & Units 12/20/22 09:08  eGFR >59 mL/min/1.73 101   Improved A1c  Worsening CBG and Insulin levels. She would like to restart Wegovy therapy- unsure if her new insurance will cover. Discussed risks/benefits of Metformin therapy- agreeable to start low dose. At higher doses, she  experienced GI upset  Assessment/Plan:   1. Hyperkalemia Monitor labs  2. Vitamin D deficiency (Primary) Continue daily OTC Vit D3 5,000 international units   3. Healthcare maintenance Resume healthy eating and increase regular daily activity.  4. Prediabetes Resume healthy eating and increase regular daily activity.  5. Obesity, current BMI 38.11  [name] is not currently in the action stage of change. As such, her goal is to get back to weightloss efforts . She has agreed to the Category 3 Plan.  [name] on weekend for weekday success.  Follow Cat 3 Meal Plan Mon-Fri 1500 cal/100g protein during weekends- to help when travelling  Exercise goals: All adults should avoid  inactivity. Some physical activity is better than none, and adults who participate in any amount of physical activity gain some health benefits.  Behavioral modification strategies: increasing lean protein intake, decreasing simple carbohydrates, increasing vegetables, increasing water intake, no skipping meals, meal planning and cooking strategies, keeping healthy foods in the home, ways to avoid boredom eating, ways to avoid night time snacking, better snacking choices, emotional eating strategies, and planning for success.  [name] has agreed to follow-up with our clinic in 4 weeks. She was informed of the importance of frequent follow-up visits to maximize her success with intensive lifestyle modifications for her multiple health conditions.   Objective:   Blood pressure 129/86, pulse 77, temperature 98.3 F (36.8 C), height 5\' 5"  (1.651 m), weight 229 lb (103.9 kg), last menstrual period 02/02/2023, SpO2 99%, unknown if currently breastfeeding. Body mass index is 38.11 kg/m.  General: Cooperative, alert, well developed, in no acute distress. HEENT: Conjunctivae and lids unremarkable. Cardiovascular: Regular rhythm.  Lungs: Normal work of breathing. Neurologic: No focal deficits.   Lab Results  Component Value Date   CREATININE 0.75 12/20/2022   BUN 12 12/20/2022   NA 143 12/20/2022   K 4.2 12/20/2022   CL 108 (H) 12/20/2022   CO2 21 12/20/2022   Lab Results  Component Value Date   ALT 21 12/20/2022   AST 26 12/20/2022   ALKPHOS 66 12/20/2022   BILITOT 0.3 12/20/2022   Lab Results  Component Value Date   HGBA1C 5.5 12/20/2022   HGBA1C 5.6 03/25/2022   HGBA1C 5.7 (H) 11/16/2021   HGBA1C 5.6 09/15/2021   HGBA1C 5.7 (H) 02/25/2021   Lab Results  Component Value Date   INSULIN 29.1 (H) 12/20/2022   INSULIN 13.7 03/25/2022   INSULIN 17.3 11/16/2021   INSULIN 15.0 09/15/2021   INSULIN 16.6 02/25/2021   Lab Results  Component Value Date   TSH 1.420 03/25/2022   Lab  Results  Component Value Date   CHOL 150 12/20/2022   HDL 55 12/20/2022   LDLCALC 85 12/20/2022   TRIG 47 12/20/2022   CHOLHDL 2.7 12/20/2022   Lab Results  Component Value Date   VD25OH 55.6 12/20/2022   VD25OH 50.7 03/25/2022   VD25OH 51.9 11/16/2021   Lab Results  Component Value Date   WBC 5.3 03/25/2022   HGB 12.5 03/25/2022   HCT 39.4 03/25/2022   MCV 89 03/25/2022   PLT 412 03/25/2022   Lab Results  Component Value Date   IRON 54 09/30/2017   TIBC 373 09/30/2017   FERRITIN 81 09/30/2017    Attestation Statements:   Reviewed by clinician on day of visit: allergies, medications, problem list, medical history, surgical history, family history, social history, and previous encounter notes.  I have reviewed the above documentation for accuracy and completeness,  and I agree with the above. -  Kyrie Fludd d. Jawara Latorre, NP-C

## 2023-02-10 ENCOUNTER — Ambulatory Visit (INDEPENDENT_AMBULATORY_CARE_PROVIDER_SITE_OTHER): Payer: BC Managed Care – PPO | Admitting: Adult Health

## 2023-03-09 ENCOUNTER — Ambulatory Visit (INDEPENDENT_AMBULATORY_CARE_PROVIDER_SITE_OTHER): Payer: 59 | Admitting: Adult Health

## 2023-04-13 ENCOUNTER — Encounter (INDEPENDENT_AMBULATORY_CARE_PROVIDER_SITE_OTHER): Payer: Self-pay | Admitting: Adult Health

## 2023-04-13 ENCOUNTER — Ambulatory Visit: Payer: BC Managed Care – PPO | Admitting: Neurology

## 2023-04-13 ENCOUNTER — Ambulatory Visit (INDEPENDENT_AMBULATORY_CARE_PROVIDER_SITE_OTHER): Payer: 59 | Admitting: Adult Health

## 2023-04-13 VITALS — BP 131/85 | HR 89 | Temp 98.2°F | Ht 65.0 in | Wt 228.0 lb

## 2023-04-13 DIAGNOSIS — E669 Obesity, unspecified: Secondary | ICD-10-CM

## 2023-04-13 DIAGNOSIS — E875 Hyperkalemia: Secondary | ICD-10-CM | POA: Diagnosis not present

## 2023-04-13 DIAGNOSIS — Z6837 Body mass index (BMI) 37.0-37.9, adult: Secondary | ICD-10-CM

## 2023-04-13 DIAGNOSIS — R7303 Prediabetes: Secondary | ICD-10-CM | POA: Diagnosis not present

## 2023-04-13 DIAGNOSIS — E66812 Obesity, class 2: Secondary | ICD-10-CM

## 2023-04-13 MED ORDER — ZEPBOUND 2.5 MG/0.5ML ~~LOC~~ SOAJ
2.5000 mg | SUBCUTANEOUS | 0 refills | Status: DC
Start: 2023-04-13 — End: 2023-04-20

## 2023-04-13 NOTE — Progress Notes (Signed)
 WEIGHT SUMMARY AND BIOMETRICS  Vitals Temp: 98.2 F (36.8 C) BP: 131/85 Pulse Rate: 89 SpO2: 100 %   Anthropometric Measurements Height: 5\' 5"  (1.651 m) Weight: 228 lb (103.4 kg) BMI (Calculated): 37.94 Weight at Last Visit: 229 lb Weight Lost Since Last Visit: 1 Weight Gained Since Last Visit: 0 Starting Weight: 233 lb Total Weight Loss (lbs): 5 lb (2.268 kg)   Body Composition  Body Fat %: 41.9 % Fat Mass (lbs): 95.6 lbs Muscle Mass (lbs): 125.8 lbs Total Body Water (lbs): 86.8 lbs Visceral Fat Rating : 11   Other Clinical Data Today's Visit #: 56 Starting Date: 02/06/18    Chief Complaint:   OBESITY [name] is here to discuss her progress with her obesity treatment plan.  She is on the the Category 3 Plan and states she is following her eating plan approximately 0 % of the time.  She states she is exercising: [employer]   Interim History:  [name] reports difficulty following the prescribed MP and participating in regular exercise the last several months. She is frustrated and would like to restart GLP-1 or GIP/GLP therapy. She has IUD. She denies has family hx of MENS 2 or MTC. She denies personal hx of pancreatitis.  She has been on Wegovy and Zepbound in past- tolerated both well.  She is unsure if her insurance will cover injection therapy. Discussed [employer] Option for Zepbpund vials.  Exercise-none  Subjective:   1. Hyperkalemia  Latest Reference Range & Units 09/15/21 10:32 11/16/21 11:33 03/25/22 09:17 12/20/22 09:08  Potassium 3.5 - 5.2 mmol/L 4.5 4.6 5.1 4.2   She denies palpitations.  2. Prediabetes Lab Results  Component Value Date   HGBA1C 5.5 12/20/2022   HGBA1C 5.6 03/25/2022   HGBA1C 5.7 (H) 11/16/2021     She endorses polyphagia. She never started Metformin therapy- removed from [facility].  [name] reports difficulty following the prescribed MP and participating in regular exercise the last several  months. She is frustrated and would like to restart GLP-1 or GIP/GLP therapy. She has IUD. She denies has family hx of MENS 2 or MTC. She denies personal hx of pancreatitis.  She has been on Wegovy and Zepbound in past- tolerated both well.  She is unsure if her insurance will cover injection therapy. Discussed [employer] Option for Zepbpund vials.  Assessment/Plan:   1. Hyperkalemia (Primary) Monitor Labs Remain well hydrated. Monitor Labs  2. Prediabetes Resume Cat 3 MP Increase regular exercise. Start Zepbound If insurance will not cover Zepboud- send MyChart message and I will send in Zepbound Vial to [employer]  3. Obesity, current BMI 37.9 Start  tirzepatide (ZEPBOUND) 2.5 MG/0.5ML Pen Inject 2.5 mg into the skin once a week. Dispense: 3 mL, Refills: 0 ordered   [name] is not currently in the action stage of change. As such, her goal is to get back to weightloss efforts . She has agreed to the Category 3 Plan.   Exercise goals:  Walking 20 mins at least twice weekly.  Behavioral modification strategies: increasing lean protein intake, decreasing simple carbohydrates, increasing vegetables, increasing water intake, no skipping meals, meal planning and cooking strategies, keeping healthy foods in the home, ways to avoid boredom eating, better snacking choices, planning for success, and decreasing junk food.  Loyal has agreed to follow-up with our clinic in 4 weeks. She was informed of the importance of frequent follow-up visits to maximize her success with intensive lifestyle modifications for  her multiple health conditions.   Check Fasting Labs Spring 2025  Objective:   Blood pressure 131/85, pulse 89, temperature 98.2 F (36.8 C), height 5\' 5"  (1.651 m), weight 228 lb (103.4 kg), SpO2 100%, unknown if currently breastfeeding. Body mass index is 37.94 kg/m.  General: Cooperative, alert, well developed, in no acute distress. HEENT: Conjunctivae and  lids unremarkable. Cardiovascular: Regular rhythm.  Lungs: Normal work of breathing. Neurologic: No focal deficits.   Lab Results  Component Value Date   CREATININE 0.75 12/20/2022   BUN 12 12/20/2022   NA 143 12/20/2022   K 4.2 12/20/2022   CL 108 (H) 12/20/2022   CO2 21 12/20/2022   Lab Results  Component Value Date   ALT 21 12/20/2022   AST 26 12/20/2022   ALKPHOS 66 12/20/2022   BILITOT 0.3 12/20/2022   Lab Results  Component Value Date   HGBA1C 5.5 12/20/2022   HGBA1C 5.6 03/25/2022   HGBA1C 5.7 (H) 11/16/2021   HGBA1C 5.6 09/15/2021   HGBA1C 5.7 (H) 02/25/2021   Lab Results  Component Value Date   INSULIN 29.1 (H) 12/20/2022   INSULIN 13.7 03/25/2022   INSULIN 17.3 11/16/2021   INSULIN 15.0 09/15/2021   INSULIN 16.6 02/25/2021   Lab Results  Component Value Date   TSH 1.420 03/25/2022   Lab Results  Component Value Date   CHOL 150 12/20/2022   HDL 55 12/20/2022   LDLCALC 85 12/20/2022   TRIG 47 12/20/2022   CHOLHDL 2.7 12/20/2022   Lab Results  Component Value Date   VD25OH 55.6 12/20/2022   VD25OH 50.7 03/25/2022   VD25OH 51.9 11/16/2021   Lab Results  Component Value Date   WBC 5.3 03/25/2022   HGB 12.5 03/25/2022   HCT 39.4 03/25/2022   MCV 89 03/25/2022   PLT 412 03/25/2022   Lab Results  Component Value Date   IRON 54 09/30/2017   TIBC 373 09/30/2017   FERRITIN 81 09/30/2017    Attestation Statements:   Reviewed by clinician on day of visit: allergies, medications, problem list, medical history, surgical history, family history, social history, and previous encounter notes.  I have reviewed the above documentation for accuracy and completeness, and I agree with the above. -  Mackinley Kiehn d. Levis Nazir, NP-C

## 2023-04-19 ENCOUNTER — Encounter: Payer: Self-pay | Admitting: *Deleted

## 2023-04-20 ENCOUNTER — Encounter (INDEPENDENT_AMBULATORY_CARE_PROVIDER_SITE_OTHER): Payer: Self-pay | Admitting: Adult Health

## 2023-04-20 ENCOUNTER — Other Ambulatory Visit (INDEPENDENT_AMBULATORY_CARE_PROVIDER_SITE_OTHER): Payer: Self-pay | Admitting: Adult Health

## 2023-04-20 LAB — HM PAP SMEAR: HM Pap smear: NEGATIVE

## 2023-04-20 MED ORDER — ZEPBOUND 2.5 MG/0.5ML ~~LOC~~ SOLN: 2.5000 mg | SUBCUTANEOUS | 0 refills | Status: DC

## 2023-05-06 ENCOUNTER — Other Ambulatory Visit (INDEPENDENT_AMBULATORY_CARE_PROVIDER_SITE_OTHER): Payer: Self-pay | Admitting: Adult Health

## 2023-05-23 ENCOUNTER — Other Ambulatory Visit: Payer: Self-pay | Admitting: Obstetrics and Gynecology

## 2023-05-23 ENCOUNTER — Ambulatory Visit (INDEPENDENT_AMBULATORY_CARE_PROVIDER_SITE_OTHER): Admitting: Adult Health

## 2023-05-23 ENCOUNTER — Encounter (INDEPENDENT_AMBULATORY_CARE_PROVIDER_SITE_OTHER): Payer: Self-pay | Admitting: Adult Health

## 2023-05-23 VITALS — BP 109/74 | HR 52 | Temp 99.0°F | Ht 65.0 in | Wt 222.0 lb

## 2023-05-23 DIAGNOSIS — Z0000 Encounter for general adult medical examination without abnormal findings: Secondary | ICD-10-CM

## 2023-05-23 DIAGNOSIS — Z6837 Body mass index (BMI) 37.0-37.9, adult: Secondary | ICD-10-CM

## 2023-05-23 DIAGNOSIS — R7303 Prediabetes: Secondary | ICD-10-CM | POA: Diagnosis not present

## 2023-05-23 DIAGNOSIS — E669 Obesity, unspecified: Secondary | ICD-10-CM

## 2023-05-23 DIAGNOSIS — Z1231 Encounter for screening mammogram for malignant neoplasm of breast: Secondary | ICD-10-CM

## 2023-05-23 DIAGNOSIS — E559 Vitamin D deficiency, unspecified: Secondary | ICD-10-CM | POA: Diagnosis not present

## 2023-05-23 DIAGNOSIS — R632 Polyphagia: Secondary | ICD-10-CM | POA: Diagnosis not present

## 2023-05-23 DIAGNOSIS — E66812 Obesity, class 2: Secondary | ICD-10-CM

## 2023-05-23 MED ORDER — ZEPBOUND 2.5 MG/0.5ML ~~LOC~~ SOLN: 2.5000 mg | SUBCUTANEOUS | 0 refills | Status: DC

## 2023-05-23 NOTE — Progress Notes (Signed)
 WEIGHT SUMMARY AND BIOMETRICS  Vitals Temp: 99 F (37.2 C) BP: 109/74 Pulse Rate: (!) 52 SpO2: 95 %   Anthropometric Measurements Height: 5\' 5"  (1.651 m) Weight: 222 lb (100.7 kg) BMI (Calculated): 36.94 Weight at Last Visit: 228 lb Weight Lost Since Last Visit: 6 lb Weight Gained Since Last Visit: 0 Starting Weight: 233 lb Total Weight Loss (lbs): 11 lb (4.99 kg)   Body Composition  Body Fat %: 41.5 % Fat Mass (lbs): 92.4 lbs Muscle Mass (lbs): 123.6 lbs Total Body Water (lbs): 88.2 lbs Visceral Fat Rating : 11   Other Clinical Data Fasting: no Labs: no Today's Visit #: 93 Starting Date: 02/06/18    Chief Complaint:   OBESITY [name] is here to discuss her progress with her obesity treatment plan.  She is on the the Category 3 Plan and states she is following her eating plan approximately 75 % of the time.  She states she is exercising Walking 20 minutes 4 times per week.  Interim History:  Started on Zepbound  2.5mg  vial on/about 04/20/2023 Due to depressed appetite, she has decreased Zepbound  to 1/2 dose of the 2.5mg  vial 04/27/2023, 05/04/2023, 05/11/2023 continued on Zepbound  to 1/2 dose of the 2.5mg  vial  Current weight 222 lbs with corresponding 37.0 Interval goal: Achieve weight of at least 213 lbs Ultimate Goal: 210 lbs  Subjective:   1. Vitamin D  deficiency  Latest Reference Range & Units 09/15/21 10:32 11/16/21 11:33 03/25/22 09:17 12/20/22 09:08  Vitamin D , 25-Hydroxy 30.0 - 100.0 ng/mL 56.0 51.9 50.7 55.6   She endorses fatigue, likely r/t to end of school year activities  2. Prediabetes  Latest Reference Range & Units 12/20/22 09:08  Glucose 70 - 99 mg/dL 604 (H)  Hemoglobin V4U 4.8 - 5.6 % 5.5  Est. average glucose Bld gHb Est-mCnc mg/dL 981  INSULIN  2.6 - 24.9 uIU/mL 29.1 (H)  (H): Data is abnormally high  Started on Zepbound  2.5mg  vial on/about 04/20/2023 Due to depressed appetite, she has decreased Zepbound  to 1/2 dose of the 2.5mg   vial 04/27/2023, 05/04/2023, 05/11/2023 continued on Zepbound  to 1/2 dose of the 2.5mg  vial  3. Polyphagia Started on Zepbound  2.5mg  vial on/about 04/20/2023 Due to depressed appetite, she has decreased Zepbound  to 1/2 dose of the 2.5mg  vial 04/27/2023, 05/04/2023, 05/11/2023 continued on Zepbound  to 1/2 dose of the 2.5mg  vial   Assessment/Plan:   1. Vitamin D  deficiency Check Labs at next OV  2. Prediabetes Check Labs at next OV  3. Polyphagia Remain well hydrated and increase protein intake  4. Obesity, current BMI 37.0 Refill tirzepatide  (ZEPBOUND ) 2.5 MG/0.5ML injection vial Inject 2.5 mg into the skin once a week. Dispense: 2 mL, Refills: 0 ordered   Kenan is currently in the action stage of change. As such, her goal is to continue with weight loss efforts. She has agreed to the Category 3 Plan.   Exercise goals: All adults should avoid inactivity. Some physical activity is better than none, and adults who participate in any amount of physical activity gain some health benefits. Adults should also include muscle-strengthening activities that involve all major muscle groups on 2 or more days a week.  Behavioral modification strategies: increasing lean protein intake, decreasing simple carbohydrates, increasing vegetables, increasing water intake, no skipping meals, meal planning and cooking strategies, keeping healthy foods in the home, and planning for success.  Janill has agreed to follow-up with our clinic in 4 weeks. She was informed of the importance of frequent follow-up  visits to maximize her success with intensive lifestyle modifications for her multiple health conditions.   Check Fasting Labs at next OV.  Objective:   Blood pressure 109/74, pulse (!) 52, temperature 99 F (37.2 C), height 5\' 5"  (1.651 m), weight 222 lb (100.7 kg), SpO2 95%, unknown if currently breastfeeding. Body mass index is 36.94 kg/m.  General: Cooperative, alert, well developed, in no acute  distress. HEENT: Conjunctivae and lids unremarkable. Cardiovascular: Regular rhythm.  Lungs: Normal work of breathing. Neurologic: No focal deficits.   Lab Results  Component Value Date   CREATININE 0.75 12/20/2022   BUN 12 12/20/2022   NA 143 12/20/2022   K 4.2 12/20/2022   CL 108 (H) 12/20/2022   CO2 21 12/20/2022   Lab Results  Component Value Date   ALT 21 12/20/2022   AST 26 12/20/2022   ALKPHOS 66 12/20/2022   BILITOT 0.3 12/20/2022   Lab Results  Component Value Date   HGBA1C 5.5 12/20/2022   HGBA1C 5.6 03/25/2022   HGBA1C 5.7 (H) 11/16/2021   HGBA1C 5.6 09/15/2021   HGBA1C 5.7 (H) 02/25/2021   Lab Results  Component Value Date   INSULIN  29.1 (H) 12/20/2022   INSULIN  13.7 03/25/2022   INSULIN  17.3 11/16/2021   INSULIN  15.0 09/15/2021   INSULIN  16.6 02/25/2021   Lab Results  Component Value Date   TSH 1.420 03/25/2022   Lab Results  Component Value Date   CHOL 150 12/20/2022   HDL 55 12/20/2022   LDLCALC 85 12/20/2022   TRIG 47 12/20/2022   CHOLHDL 2.7 12/20/2022   Lab Results  Component Value Date   VD25OH 55.6 12/20/2022   VD25OH 50.7 03/25/2022   VD25OH 51.9 11/16/2021   Lab Results  Component Value Date   WBC 5.3 03/25/2022   HGB 12.5 03/25/2022   HCT 39.4 03/25/2022   MCV 89 03/25/2022   PLT 412 03/25/2022   Lab Results  Component Value Date   IRON 54 09/30/2017   TIBC 373 09/30/2017   FERRITIN 81 09/30/2017   Attestation Statements:   Reviewed by clinician on day of visit: allergies, medications, problem list, medical history, surgical history, family history, social history, and previous encounter notes.  I have reviewed the above documentation for accuracy and completeness, and I agree with the above. -  Keerstin Bjelland d. Nathanyl Andujo, NP-C

## 2023-05-24 ENCOUNTER — Other Ambulatory Visit: Payer: Self-pay | Admitting: Obstetrics and Gynecology

## 2023-05-26 LAB — SURGICAL PATHOLOGY

## 2023-05-27 ENCOUNTER — Ambulatory Visit
Admission: RE | Admit: 2023-05-27 | Discharge: 2023-05-27 | Disposition: A | Discharge status: Home / Self Care | Source: Ambulatory Visit | Attending: Obstetrics and Gynecology | Admitting: Obstetrics and Gynecology

## 2023-05-27 DIAGNOSIS — Z1231 Encounter for screening mammogram for malignant neoplasm of breast: Secondary | ICD-10-CM

## 2023-06-23 ENCOUNTER — Other Ambulatory Visit (INDEPENDENT_AMBULATORY_CARE_PROVIDER_SITE_OTHER): Payer: Self-pay | Admitting: Adult Health

## 2023-07-05 ENCOUNTER — Ambulatory Visit (INDEPENDENT_AMBULATORY_CARE_PROVIDER_SITE_OTHER): Admitting: Adult Health

## 2023-07-20 ENCOUNTER — Ambulatory Visit (INDEPENDENT_AMBULATORY_CARE_PROVIDER_SITE_OTHER): Admitting: Adult Health

## 2023-08-08 ENCOUNTER — Encounter (INDEPENDENT_AMBULATORY_CARE_PROVIDER_SITE_OTHER): Payer: Self-pay | Admitting: Adult Health

## 2023-08-08 ENCOUNTER — Ambulatory Visit (INDEPENDENT_AMBULATORY_CARE_PROVIDER_SITE_OTHER): Admitting: Adult Health

## 2023-08-08 VITALS — BP 110/73 | HR 87 | Temp 99.2°F | Ht 65.0 in | Wt 217.0 lb

## 2023-08-08 DIAGNOSIS — R7303 Prediabetes: Secondary | ICD-10-CM | POA: Diagnosis not present

## 2023-08-08 DIAGNOSIS — R632 Polyphagia: Secondary | ICD-10-CM | POA: Diagnosis not present

## 2023-08-08 DIAGNOSIS — E559 Vitamin D deficiency, unspecified: Secondary | ICD-10-CM

## 2023-08-08 DIAGNOSIS — E669 Obesity, unspecified: Secondary | ICD-10-CM

## 2023-08-08 DIAGNOSIS — Z6836 Body mass index (BMI) 36.0-36.9, adult: Secondary | ICD-10-CM

## 2023-08-08 DIAGNOSIS — E66812 Obesity, class 2: Secondary | ICD-10-CM

## 2023-08-08 MED ORDER — ZEPBOUND 2.5 MG/0.5ML ~~LOC~~ SOLN: 2.5000 mg | SUBCUTANEOUS | 0 refills | Status: DC

## 2023-08-08 NOTE — Progress Notes (Signed)
 WEIGHT SUMMARY AND BIOMETRICS  Vitals Temp: 99.2 F (37.3 C) BP: 110/73 Pulse Rate: 87 SpO2: 99 %   Anthropometric Measurements Height: 5' 5 (1.651 m) Weight: 217 lb (98.4 kg) BMI (Calculated): 36.11 Weight at Last Visit: 222 lb Weight Lost Since Last Visit: 5 lb Weight Gained Since Last Visit: 0 Starting Weight: 233 lb Total Weight Loss (lbs): 16 lb (7.258 kg)   Body Composition  Body Fat %: 37.1 % Fat Mass (lbs): 80.8 lbs Muscle Mass (lbs): 130 lbs Total Body Water (lbs): 90.8 lbs Visceral Fat Rating : 9   Other Clinical Data Fasting: no Labs: yes Today's Visit #: 4 Starting Date: 02/06/18    Chief Complaint:   OBESITY [name] is here to discuss her progress with her obesity treatment plan.  She is on the the Category 3 Plan and states she is following her eating plan approximately 50 % of the time.  She states she is exercising Brisk Walking 30 minutes 4-5 times per week.  Interim History:  Ms. Ledger recently returned from 1.5 weeks at the [facility] She walked frequently and relaxed her eating on plan. She held her Zepbound  while traveling- resumed full Zepbound  2.5mg  injection on 07/31/2023 Denies mass in neck, dysphagia, dyspepsia, persistent hoarseness, abdominal pain, or N/V/C   Reviewed Bioimpedance results with pt: Muscle Mass: +6.4 lbs Adipose Mass: -11.6 lbs   Subjective:   1. Polyphagia Started on Zepbound  2.5mg  vial on/about 04/20/2023 Due to depressed appetite, she has decreased Zepbound  to 1/2 dose of the 2.5mg  vial 04/27/2023, 05/04/2023, 05/11/2023 continued on Zepbound  to 1/2 dose of the 2.5mg  vial She held her Zepbound  while traveling- resumed full Zepbound  2.5mg  injection on 07/31/2023 Denies mass in neck, dysphagia, dyspepsia, persistent hoarseness, abdominal pain, or N/V/C   2. Prediabetes Lab Results  Component Value Date   HGBA1C 5.5 12/20/2022   HGBA1C 5.6 03/25/2022   HGBA1C 5.7 (H) 11/16/2021    Started on Zepbound   2.5mg  vial on/about 04/20/2023 Due to depressed appetite, she has decreased Zepbound  to 1/2 dose of the 2.5mg  vial 04/27/2023, 05/04/2023, 05/11/2023 continued on Zepbound  to 1/2 dose of the 2.5mg  vial She held her Zepbound  while traveling- resumed full Zepbound  2.5mg  injection on 07/31/2023 Denies mass in neck, dysphagia, dyspepsia, persistent hoarseness, abdominal pain, or N/V/C   3. Vitamin D  deficiency  Latest Reference Range & Units 11/16/21 11:33 03/25/22 09:17 12/20/22 09:08  Vitamin D , 25-Hydroxy 30.0 - 100.0 ng/mL 51.9 50.7 55.6   She is on Vit D 3 5,000 international units daily  Assessment/Plan:   1. Polyphagia (Primary) Refill  tirzepatide  (ZEPBOUND ) 2.5 MG/0.5ML injection vial Inject 2.5 mg into the skin once a week. Dispense: 2 mL, Refills: 0 ordered   2. Prediabetes Refill  tirzepatide  (ZEPBOUND ) 2.5 MG/0.5ML injection vial Inject 2.5 mg into the skin once a week. Dispense: 2 mL, Refills: 0 ordered   3. Vitamin D  deficiency Check Labs at next OV  4. Obesity, current BMI 36.2 Refill  tirzepatide  (ZEPBOUND ) 2.5 MG/0.5ML injection vial Inject 2.5 mg into the skin once a week. Dispense: 2 mL, Refills: 0 ordered   Nihira is currently in the action stage of change. As such, her goal is to continue with weight loss efforts. She has agreed to the Category 3 Plan.   Exercise goals: For substantial health benefits, adults should do at least 150 minutes (2 hours and 30 minutes) a week of moderate-intensity, or 75 minutes (1 hour and 15 minutes) a week of vigorous-intensity aerobic  physical activity, or an equivalent combination of moderate- and vigorous-intensity aerobic activity. Aerobic activity should be performed in episodes of at least 10 minutes, and preferably, it should be spread throughout the week.  Behavioral modification strategies: increasing lean protein intake, decreasing simple carbohydrates, increasing vegetables, increasing water intake, no skipping meals, meal  planning and cooking strategies, keeping healthy foods in the home, ways to avoid boredom eating, and planning for success.  Lavon has agreed to follow-up with our clinic in 4 weeks. She was informed of the importance of frequent follow-up visits to maximize her success with intensive lifestyle modifications for her multiple health conditions.   Check Fasting Labs at next OV  Objective:   Blood pressure 110/73, pulse 87, temperature 99.2 F (37.3 C), height 5' 5 (1.651 m), weight 217 lb (98.4 kg), SpO2 99%, unknown if currently breastfeeding. Body mass index is 36.11 kg/m.  General: Cooperative, alert, well developed, in no acute distress. HEENT: Conjunctivae and lids unremarkable. Cardiovascular: Regular rhythm.  Lungs: Normal work of breathing. Neurologic: No focal deficits.   Lab Results  Component Value Date   CREATININE 0.75 12/20/2022   BUN 12 12/20/2022   NA 143 12/20/2022   K 4.2 12/20/2022   CL 108 (H) 12/20/2022   CO2 21 12/20/2022   Lab Results  Component Value Date   ALT 21 12/20/2022   AST 26 12/20/2022   ALKPHOS 66 12/20/2022   BILITOT 0.3 12/20/2022   Lab Results  Component Value Date   HGBA1C 5.5 12/20/2022   HGBA1C 5.6 03/25/2022   HGBA1C 5.7 (H) 11/16/2021   HGBA1C 5.6 09/15/2021   HGBA1C 5.7 (H) 02/25/2021   Lab Results  Component Value Date   INSULIN  29.1 (H) 12/20/2022   INSULIN  13.7 03/25/2022   INSULIN  17.3 11/16/2021   INSULIN  15.0 09/15/2021   INSULIN  16.6 02/25/2021   Lab Results  Component Value Date   TSH 1.420 03/25/2022   Lab Results  Component Value Date   CHOL 150 12/20/2022   HDL 55 12/20/2022   LDLCALC 85 12/20/2022   TRIG 47 12/20/2022   CHOLHDL 2.7 12/20/2022   Lab Results  Component Value Date   VD25OH 55.6 12/20/2022   VD25OH 50.7 03/25/2022   VD25OH 51.9 11/16/2021   Lab Results  Component Value Date   WBC 5.3 03/25/2022   HGB 12.5 03/25/2022   HCT 39.4 03/25/2022   MCV 89 03/25/2022   PLT 412  03/25/2022   Lab Results  Component Value Date   IRON 54 09/30/2017   TIBC 373 09/30/2017   FERRITIN 81 09/30/2017   Attestation Statements:   Reviewed by clinician on day of visit: allergies, medications, problem list, medical history, surgical history, family history, social history, and previous encounter notes.  I have reviewed the above documentation for accuracy and completeness, and I agree with the above. -  Ladarrell Cornwall d. Psalm Arman, NP-C

## 2023-08-29 ENCOUNTER — Other Ambulatory Visit (INDEPENDENT_AMBULATORY_CARE_PROVIDER_SITE_OTHER): Payer: Self-pay | Admitting: Adult Health

## 2023-09-22 ENCOUNTER — Encounter (INDEPENDENT_AMBULATORY_CARE_PROVIDER_SITE_OTHER): Payer: Self-pay | Admitting: Adult Health

## 2023-09-22 ENCOUNTER — Ambulatory Visit (INDEPENDENT_AMBULATORY_CARE_PROVIDER_SITE_OTHER): Admitting: Adult Health

## 2023-09-22 VITALS — BP 107/74 | HR 76 | Temp 98.4°F | Ht 65.0 in | Wt 218.0 lb

## 2023-09-22 DIAGNOSIS — E875 Hyperkalemia: Secondary | ICD-10-CM

## 2023-09-22 DIAGNOSIS — R7303 Prediabetes: Secondary | ICD-10-CM | POA: Diagnosis not present

## 2023-09-22 DIAGNOSIS — E66812 Obesity, class 2: Secondary | ICD-10-CM

## 2023-09-22 DIAGNOSIS — Z6836 Body mass index (BMI) 36.0-36.9, adult: Secondary | ICD-10-CM

## 2023-09-22 DIAGNOSIS — E669 Obesity, unspecified: Secondary | ICD-10-CM

## 2023-09-22 DIAGNOSIS — F5089 Other specified eating disorder: Secondary | ICD-10-CM | POA: Diagnosis not present

## 2023-09-22 DIAGNOSIS — F3289 Other specified depressive episodes: Secondary | ICD-10-CM

## 2023-09-22 DIAGNOSIS — E559 Vitamin D deficiency, unspecified: Secondary | ICD-10-CM

## 2023-09-22 MED ORDER — ZEPBOUND 2.5 MG/0.5ML ~~LOC~~ SOLN: 2.5000 mg | SUBCUTANEOUS | 0 refills | Status: DC

## 2023-09-22 NOTE — Progress Notes (Signed)
 WEIGHT SUMMARY AND BIOMETRICS  Vitals Temp: 98.4 F (36.9 C) BP: 107/74 Pulse Rate: 76 SpO2: 99 %   Anthropometric Measurements Height: 5' 5 (1.651 m) Weight: 218 lb (98.9 kg) BMI (Calculated): 36.28 Weight at Last Visit: 217lb Weight Lost Since Last Visit: 0 Weight Gained Since Last Visit: 1lb Starting Weight: 233lb Total Weight Loss (lbs): 15 lb (6.804 kg)   Body Composition  Body Fat %: 41.6 % Fat Mass (lbs): 91 lbs Muscle Mass (lbs): 121.4 lbs Total Body Water (lbs): 86 lbs Visceral Fat Rating : 10   Other Clinical Data Fasting: yes Labs: no Today's Visit #: 52 Starting Date: 02/06/18    Chief Complaint:   OBESITY [name] is here to discuss her progress with her obesity treatment plan.  She is on the the Category 3 Plan and states she is following her eating plan approximately 75 % of the time.  She states she is exercising Walking 30 minutes 4 times per week.  Interim History:  Started on Zepbound  2.5mg  vial on/about 04/20/2023 Due to depressed appetite, she has decreased Zepbound  to 1/2 dose of the 2.5mg  vial 04/27/2023, 05/04/2023, 05/11/2023 continued on Zepbound  to 1/2 dose of the 2.5mg  vial She held her Zepbound  while traveling- resumed full Zepbound  2.5mg  injection on 07/31/2023 Denies mass in neck, dysphagia, dyspepsia, persistent hoarseness, abdominal pain, or N/V/C   She would like to remain on the 2.5mg  strength  Her children are back in school and family is adjusting to new routine.  Subjective:   1. Hyperkalemia She denies palpitations  2. Prediabetes Lab Results  Component Value Date   HGBA1C 5.5 12/20/2022   HGBA1C 5.6 03/25/2022   HGBA1C 5.7 (H) 11/16/2021    Started on Zepbound  2.5mg  vial on/about 04/20/2023 Due to depressed appetite, she has decreased Zepbound  to 1/2 dose of the 2.5mg  vial 04/27/2023, 05/04/2023, 05/11/2023 continued on Zepbound  to 1/2 dose of the 2.5mg  vial She held her Zepbound  while traveling- resumed full  Zepbound  2.5mg  injection on 07/31/2023 Denies mass in neck, dysphagia, dyspepsia, persistent hoarseness, abdominal pain, or N/V/C   She would like to remain on the 2.5mg  strength.  3. Vitamin D  deficiency She is on daily OTC MVI and OTC VitD3 5000 international units   4. Emotional Eating Behavior PCP manages daily Wellbutrin  XL 300mg  and Prozac  20mg  She endorses stable mood  Assessment/Plan:   1. Hyperkalemia Check Labs - Comprehensive metabolic panel with GFR  2. Prediabetes (Primary) Check Labs - Hemoglobin A1c - Insulin , random  3. Vitamin D  deficiency Check Labs - VITAMIN D  25 Hydroxy (Vit-D Deficiency, Fractures)  4. Emotional Eating Behavior Contact PCP for antidepressant refills  5. Obesity, current BMI 36.4 Refill  tirzepatide  (ZEPBOUND ) 2.5 MG/0.5ML injection vial Inject 2.5 mg into the skin once a week. Dispense: 2 mL, Refills: 0 ordered   [name] is currently in the action stage of change. As such, her goal is to continue with weight loss efforts. She has agreed to the Category 3 Plan.   Exercise goals: All adults should avoid inactivity. Some physical activity is better than none, and adults who participate in any amount of physical activity gain some health benefits. Adults should also include muscle-strengthening activities that involve all major muscle groups on 2 or more days a week.  Behavioral modification strategies: increasing lean protein intake, decreasing simple carbohydrates, increasing vegetables, increasing water intake, no skipping meals, meal planning and cooking strategies, keeping healthy foods in the home, ways to avoid boredom eating, and planning  for success.  [name] has agreed to follow-up with our clinic in 4 weeks. She was informed of the importance of frequent follow-up visits to maximize her success with intensive lifestyle modifications for her multiple health conditions.   [name] was informed we would discuss her lab results at her  next visit unless there is a critical issue that needs to be addressed sooner. [name] agreed to keep her next visit at the agreed upon time to discuss these results.  Objective:   Blood pressure 107/74, pulse 76, temperature 98.4 F (36.9 C), height 5' 5 (1.651 m), weight 218 lb (98.9 kg), last menstrual period 09/20/2023, SpO2 99%, unknown if currently breastfeeding. Body mass index is 36.28 kg/m.  General: Cooperative, alert, well developed, in no acute distress. HEENT: Conjunctivae and lids unremarkable. Cardiovascular: Regular rhythm.  Lungs: Normal work of breathing. Neurologic: No focal deficits.   Lab Results  Component Value Date   CREATININE 0.75 12/20/2022   BUN 12 12/20/2022   NA 143 12/20/2022   K 4.2 12/20/2022   CL 108 (H) 12/20/2022   CO2 21 12/20/2022   Lab Results  Component Value Date   ALT 21 12/20/2022   AST 26 12/20/2022   ALKPHOS 66 12/20/2022   BILITOT 0.3 12/20/2022   Lab Results  Component Value Date   HGBA1C 5.5 12/20/2022   HGBA1C 5.6 03/25/2022   HGBA1C 5.7 (H) 11/16/2021   HGBA1C 5.6 09/15/2021   HGBA1C 5.7 (H) 02/25/2021   Lab Results  Component Value Date   INSULIN  29.1 (H) 12/20/2022   INSULIN  13.7 03/25/2022   INSULIN  17.3 11/16/2021   INSULIN  15.0 09/15/2021   INSULIN  16.6 02/25/2021   Lab Results  Component Value Date   TSH 1.420 03/25/2022   Lab Results  Component Value Date   CHOL 150 12/20/2022   HDL 55 12/20/2022   LDLCALC 85 12/20/2022   TRIG 47 12/20/2022   CHOLHDL 2.7 12/20/2022   Lab Results  Component Value Date   VD25OH 55.6 12/20/2022   VD25OH 50.7 03/25/2022   VD25OH 51.9 11/16/2021   Lab Results  Component Value Date   WBC 5.3 03/25/2022   HGB 12.5 03/25/2022   HCT 39.4 03/25/2022   MCV 89 03/25/2022   PLT 412 03/25/2022   Lab Results  Component Value Date   IRON 54 09/30/2017   TIBC 373 09/30/2017   FERRITIN 81 09/30/2017   Attestation Statements:   Reviewed by clinician on day of visit:  allergies, medications, problem list, medical history, surgical history, family history, social history, and previous encounter notes.  I have reviewed the above documentation for accuracy and completeness, and I agree with the above. -  Moyses Pavey d. Jaecob Lowden, NP-C

## 2023-09-23 LAB — COMPREHENSIVE METABOLIC PANEL WITH GFR
ALT: 21 IU/L (ref 0–32)
AST: 16 IU/L (ref 0–40)
Albumin: 4.4 g/dL (ref 3.9–4.9)
Alkaline Phosphatase: 63 IU/L (ref 44–121)
BUN/Creatinine Ratio: 15 (ref 9–23)
BUN: 12 mg/dL (ref 6–24)
Bilirubin Total: 0.3 mg/dL (ref 0.0–1.2)
CO2: 22 mmol/L (ref 20–29)
Calcium: 9.5 mg/dL (ref 8.7–10.2)
Chloride: 103 mmol/L (ref 96–106)
Creatinine, Ser: 0.79 mg/dL (ref 0.57–1.00)
Globulin, Total: 2.7 g/dL (ref 1.5–4.5)
Glucose: 84 mg/dL (ref 70–99)
Potassium: 4.6 mmol/L (ref 3.5–5.2)
Sodium: 139 mmol/L (ref 134–144)
Total Protein: 7.1 g/dL (ref 6.0–8.5)
eGFR: 95 mL/min/1.73 (ref >59)

## 2023-09-23 LAB — HEMOGLOBIN A1C
Est. average glucose Bld gHb Est-mCnc: 108 mg/dL
Hgb A1c MFr Bld: 5.4 % (ref 4.8–5.6)

## 2023-09-23 LAB — VITAMIN D 25 HYDROXY (VIT D DEFICIENCY, FRACTURES): Vit D, 25-Hydroxy: 73.5 ng/mL (ref 30.0–100.0)

## 2023-09-23 LAB — INSULIN, RANDOM: INSULIN: 14.5 u[IU]/mL (ref 2.6–24.9)

## 2023-09-26 ENCOUNTER — Encounter: Payer: Self-pay | Admitting: Internal Medicine

## 2023-09-26 ENCOUNTER — Ambulatory Visit (INDEPENDENT_AMBULATORY_CARE_PROVIDER_SITE_OTHER): Admitting: Internal Medicine

## 2023-09-26 VITALS — BP 110/80 | HR 80 | Ht 65.0 in | Wt 218.0 lb

## 2023-09-26 DIAGNOSIS — R5383 Other fatigue: Secondary | ICD-10-CM | POA: Diagnosis not present

## 2023-09-26 DIAGNOSIS — R059 Cough, unspecified: Secondary | ICD-10-CM | POA: Diagnosis not present

## 2023-09-26 DIAGNOSIS — J22 Unspecified acute lower respiratory infection: Secondary | ICD-10-CM

## 2023-09-26 DIAGNOSIS — Z6836 Body mass index (BMI) 36.0-36.9, adult: Secondary | ICD-10-CM

## 2023-09-26 DIAGNOSIS — E669 Obesity, unspecified: Secondary | ICD-10-CM

## 2023-09-26 DIAGNOSIS — E7439 Other disorders of intestinal carbohydrate absorption: Secondary | ICD-10-CM

## 2023-09-26 LAB — POC COVID19/FLU A&B COMBO
Covid Antigen, POC: NEGATIVE
Influenza A Antigen, POC: NEGATIVE
Influenza B Antigen, POC: NEGATIVE

## 2023-09-26 MED ORDER — FLUCONAZOLE 150 MG PO TABS: 150.0000 mg | ORAL_TABLET | Freq: Once | ORAL | 0 refills | Status: AC

## 2023-09-26 MED ORDER — FLUOXETINE HCL 20 MG PO TABS: 20.0000 mg | ORAL_TABLET | Freq: Every day | ORAL | 1 refills | Status: AC

## 2023-09-26 MED ORDER — AZITHROMYCIN 250 MG PO TABS: ORAL_TABLET | ORAL | 0 refills | Status: AC

## 2023-09-26 MED ORDER — BUPROPION HCL ER (XL) 300 MG PO TB24: 300.0000 mg | ORAL_TABLET | Freq: Every day | ORAL | 1 refills | Status: AC

## 2023-09-26 NOTE — Progress Notes (Signed)
 Patient Care Team: Courtney Courtney PARAS, MD as PCP - General (Internal Medicine) [name].Collins as Veterinary surgeon (Behavioral Health) Courtney Cape, MD as Consulting Physician (Obstetrics and Gynecology)  Visit Date: 09/26/23  Subjective:    Patient ID: [name] , Female   DOB: Apr 22, 1980, 43 y.o.    MRN: [id number]   43 y.o. Female presents today for sick visit for cough, fatigue and ear pain. Patient has a past medical history of GERD, prediabetes and obesity.  She says that she started developing respiratory congestion on Sept.7th. She says that at first she felt congestion more in her in head and subsequently she began to feel it in her chest. She says it feels worse when she lies down. She has a cough and ear pain. Covid and Flu tests were negative. Does not recall being around anyone that  is sick.   History of anxiety and depression, Treated with Bupropion  300 mg daily and Prozac  20 mg daily. Bupropion  and Prozac  refilled  today at her request. She tolerates both meds well.  Followed at [facility] for weight loss management.   Due for influenza Vaccine but needs to defer vaccine until over this acute illness   Health maintenance: Has had recent eye exam   Labs  reviewed from 09/22/2023 are normal. Hgb AIC excellent at 5.4% In October 2023, Hgb AIC was 5.7%  Past Medical History:  Diagnosis Date   Class 2 obesity due to excess calories with body mass index (BMI) of 35.0 to 35.9 in adult 02/08/2018   GERD (gastroesophageal reflux disease)    not currently   Gestational diabetes mellitus (GDM), antepartum    Heart murmur    HSV infection    Leaky heart valve    Prediabetes    Vitamin D  deficiency      Family History  Problem Relation Age of Onset   Hypertension Mother    Sleep apnea Mother    Obesity Mother    Diabetes Father    High Cholesterol Father    Cancer Father    Hypertension Brother    Diabetes Brother    Breast cancer Neg Hx    BRCA 1/2  Neg Hx        Review of Systems  Constitutional:  Positive for malaise/fatigue. Negative for chills and fever.  HENT:  Positive for ear pain.   Respiratory:  Positive for cough (productive cough).         Objective:   Vitals: BP 110/80   Pulse 80   Ht 5' 5 (1.651 m)   Wt 218 lb (98.9 kg)   LMP 09/20/2023   SpO2 97%   BMI 36.28 kg/m    Physical Exam HENT:     Left Ear: Tympanic membrane is injected.     Ears:     Comments: Left and right TM dull, right TM splayed, Left TM injected centrally.  Pulmonary:     Breath sounds: Normal breath sounds.      Chest is clear without rales or wheezing.     Pharynx is clear. Skin is warm and dry. No tachypnea     Looks fatigued.      Rapid Covid and Flu tests are both negative  Results:     Labs:       Component Value Date/Time   NA 139 09/22/2023 0938   K 4.6 09/22/2023 0938   CL 103 09/22/2023 0938   CO2 22 09/22/2023 0938   GLUCOSE 84 09/22/2023  9061   GLUCOSE 88 03/18/2020 1636   BUN 12 09/22/2023 0938   CREATININE 0.79 09/22/2023 0938   CREATININE 0.73 03/18/2020 1636   CALCIUM 9.5 09/22/2023 0938   PROT 7.1 09/22/2023 0938   ALBUMIN 4.4 09/22/2023 0938   AST 16 09/22/2023 0938   ALT 21 09/22/2023 0938   ALKPHOS 63 09/22/2023 0938   BILITOT 0.3 09/22/2023 0938   GFRNONAA 103 03/18/2020 1636   GFRAA 119 03/18/2020 1636     Lab Results  Component Value Date   WBC 5.3 03/25/2022   HGB 12.5 03/25/2022   HCT 39.4 03/25/2022   MCV 89 03/25/2022   PLT 412 03/25/2022    Lab Results  Component Value Date   CHOL 150 12/20/2022   HDL 55 12/20/2022   LDLCALC 85 12/20/2022   TRIG 47 12/20/2022   CHOLHDL 2.7 12/20/2022    Lab Results  Component Value Date   HGBA1C 5.4 09/22/2023     Lab Results  Component Value Date   TSH 1.420 03/25/2022      Assessment & Plan:   Acute respiratory infection: She says that she started to feel congested on Sunday. She said that at first she felt it more in  her in her head and then she began to feel it in her chest. She says it feels worse when she lays down. She also has a cough and ear pain. Covid and flu tests were negative. Denies being around anyone that has been sick.    Prescribed Zithromax  250 mg 2 tablets on the first day then take 1 tablet on days 2 through 5. May take Diflucan  150 mg by mouth once for Candida vaginitis if develops yeast infection while on Zithromax   Emotional eating associated with Depression: Treated with bupropion  300 mg daily and Prozac  20 mg daily.  Bupropion  and Prozac  refilled per patient request  Vaccine maintenance: Due for Influenza vaccine.    Health maintenance:  says she received eye exam recently   Hx of Impaired glucose tolerance. Hgb AIC 5.7% in 2023 but most recent Hgb AIC  normal at 5.4% in early September  BMI 36- seen at [facility]  Labs 09/22/2023 were all WNL including Hgb AIC at 5.4%, C-met and Vitamin D    I,[name],acting as a scribe for Courtney JINNY Hailstone, MD.,have documented all relevant documentation on the behalf of Courtney JINNY Hailstone, MD,as directed by  Courtney JINNY Hailstone, MD while in the presence of Courtney JINNY Hailstone, MD.

## 2023-09-28 ENCOUNTER — Encounter: Payer: Self-pay | Admitting: Internal Medicine

## 2023-09-29 ENCOUNTER — Ambulatory Visit: Payer: Self-pay

## 2023-09-29 NOTE — Telephone Encounter (Signed)
Would you like to see her back.

## 2023-09-29 NOTE — Telephone Encounter (Signed)
 Patient is still congested, coughing, fatigued and is breathing through her mouth.  Patient has diarrhea it started right after she took the antibiotics.  She is going to do a home COVID test and call back with results.

## 2023-09-29 NOTE — Telephone Encounter (Signed)
 Pt states that she was told to call clinic if she was still feeling bad. Pt states s/s have not improved. Pt states that her head is pounding, she still has a cough. Pt states that overall she is worse than when seen 3 days ago. Pt states that she has been taking the abx as prescribed. Pt would like a call back advising next steps.   Message from Corunna H sent at 09/29/2023  9:25 AM EDT  Summary: dizzy spells,cough,head congestion   Reason for Triage: cough,head congestion ,dizzy spells, still not better after seeing provider on 09/26/2023

## 2023-09-30 ENCOUNTER — Encounter: Payer: Self-pay | Admitting: Internal Medicine

## 2023-09-30 ENCOUNTER — Ambulatory Visit
Admission: RE | Admit: 2023-09-30 | Discharge: 2023-09-30 | Disposition: A | Discharge status: Home / Self Care | Source: Ambulatory Visit | Attending: Internal Medicine | Admitting: Internal Medicine

## 2023-09-30 ENCOUNTER — Ambulatory Visit (INDEPENDENT_AMBULATORY_CARE_PROVIDER_SITE_OTHER): Admitting: Internal Medicine

## 2023-09-30 ENCOUNTER — Ambulatory Visit: Payer: Self-pay | Admitting: Internal Medicine

## 2023-09-30 VITALS — BP 116/78 | HR 88 | Temp 97.9°F | Ht 65.0 in | Wt 218.0 lb

## 2023-09-30 DIAGNOSIS — R051 Acute cough: Secondary | ICD-10-CM | POA: Diagnosis not present

## 2023-09-30 DIAGNOSIS — K219 Gastro-esophageal reflux disease without esophagitis: Secondary | ICD-10-CM

## 2023-09-30 DIAGNOSIS — J22 Unspecified acute lower respiratory infection: Secondary | ICD-10-CM | POA: Diagnosis not present

## 2023-09-30 MED ORDER — HYDROCODONE BIT-HOMATROP MBR 5-1.5 MG/5ML PO SOLN: 5.0000 mL | Freq: Three times a day (TID) | ORAL | 0 refills | Status: DC | PRN

## 2023-09-30 MED ORDER — PANTOPRAZOLE SODIUM 20 MG PO TBEC: 20.0000 mg | DELAYED_RELEASE_TABLET | Freq: Every day | ORAL | 2 refills | Status: AC

## 2023-09-30 MED ORDER — CEFTRIAXONE SODIUM 1 G IJ SOLR
1.0000 g | Freq: Once | INTRAMUSCULAR | Status: AC
  Administered 2023-09-30: 1 g via INTRAMUSCULAR

## 2023-09-30 NOTE — Progress Notes (Signed)
 Patient Care Team: [name], MD as PCP - General (Internal Medicine) [name] as Veterinary surgeon (Behavioral Health) [name], MD as Consulting Physician (Obstetrics and Gynecology)  Visit Date: 09/30/23  Subjective:   Chief Complaint  Patient presents with   Cough    Z-pak breaking stuff up per patient.    Vitals:   09/30/23 1237  BP: 116/78   Patient PI:[name],Female DOB:[birth year],43 y.o. MRN:[id number]   43 y.o.Female presents today for acute sick visit with Cough, fatigue, ear pain. Patient has a past medical history of Acute respiratory infection, Depression, Vitamin D  deficiency, .   Her last visit was on 09/26/23 for an acute respiratory infection. She still that while she has gotten a little better she still has a head ache and a cough. The cough syrup that she had at home did nothing to relieve the cough. She has a productive cough that changed from white sputum to yellow sputum. She also still has ear pain. Denies fever, chills, nausea or vomiting, and shaking. A chest X-ray from [place] [facility] was ordered to rule out Pneumonia. She received an injection of Rocephin  1 g IM today and was prescribed Hycodan 5-1.5 mg/mL syrup 5 mL every 8 hours as needed.    She said that last week she had experienced severe acid reflux  that  woke her up from her sleep and caused her to cough She said that this was the second time this has  happened. She was taking OTC reflux medication and her mother told her that eating an apple would make it feel better. She said that did provide some relief. She was prescribed Protonix  20 mg daily today to take on a regular basis.  Seen at [facility] with dx of Prediabetes and obesity.  Hx of depression treated by Wellbutrin  300 mg daily and Prozac  20 mg daily.    History of  Vitamin D  deficiency treated by Vitamin D3 125 mcg daily.    Hx of mild glucose intolerance. Is now on GLP-1 medication for obesity and  glucose intolerance per [facility]  Past Medical History:  Diagnosis Date   Class 2 obesity due to excess calories with body mass index (BMI) of 35.0 to 35.9 in adult 02/08/2018   GERD (gastroesophageal reflux disease)    not currently   Gestational diabetes mellitus (GDM), antepartum    Heart murmur    HSV infection    Leaky heart valve    Prediabetes    Vitamin D  deficiency     No Known Allergies Immunization History  Administered Date(s) Administered   DTaP 02/16/1980, 05/10/1980, 09/02/1980, 12/22/1981, 09/08/1982   HPV 9-valent 04/07/2020, 06/09/2020, 12/23/2020   IPV 02/16/1980, 05/10/1980, 09/02/1980, 12/22/1981   Influenza,inj,Quad PF,6+ Mos 10/06/2018   Influenza-Unspecified 11/15/2017, 12/18/2020   MMR 05/22/1981, 05/11/1994   MODERNA COVID-19 SARS-COV-2 PEDS BIVALENT BOOSTER 78yr-6yr 12/18/2020   Moderna Covid-19 Vaccine Bivalent Booster 42yrs & up 12/26/2020   Moderna SARS-COV2 Booster Vaccination 11/21/2019   Moderna Sars-Covid-2 Vaccination 03/22/2019, 04/24/2019   Td 09/16/1995   Tdap 05/26/2006, 02/06/2015   Past Surgical History:  Procedure Laterality Date   BREAST BIOPSY Right 05/02/2014   CESAREAN SECTION     CESAREAN SECTION N/A 04/21/2015   Procedure: CESAREAN SECTION;  Surgeon: Jerolyn Foil, MD;  Location: [facility];  Service: Obstetrics;  Laterality: N/A;   DILATION AND EVACUATION N/A 08/09/2013   Procedure: DILATATION AND EVACUATION;  Surgeon: Cape DELENA Armond, MD;  Location: [facility];  Service:  Gynecology;  Laterality: N/A;   excision of pilonidal cyst     EXCISION OF SKIN TAG Right 08/09/2013   Procedure: EXCISION OF SKIN TAG on right buttock;  Surgeon: Ovid DELENA All, MD;  Location: [facility];  Service: Gynecology;  Laterality: Right;  This procedure was added on after the patient had gone to sleep (as per Dr. All)   OPERATIVE ULTRASOUND N/A 08/09/2013   Procedure: OPERATIVE ULTRASOUND;  Surgeon: Ovid DELENA All, MD;  Location: [facility];  Service:  Gynecology;  Laterality: N/A;   TONSILLECTOMY      Family History  Problem Relation Age of Onset   Hypertension Mother    Sleep apnea Mother    Obesity Mother    Diabetes Father    High Cholesterol Father    Cancer Father    Hypertension Brother    Diabetes Brother    Breast cancer Neg Hx    BRCA 1/2 Neg Hx    Social Hx: married. 2 children,  one son and one daughter.  Family hx of obesity in mother  Review of Systems  Constitutional:  Positive for malaise/fatigue. Negative for chills and fever.  HENT:  Positive for congestion and ear pain.   Respiratory:  Positive for cough and sputum production (yellow sputum).   Gastrointestinal:  Negative for nausea.     Objective:  Vitals: BP 116/78   Pulse 88   Temp 97.9 F (36.6 C) (Tympanic)   Ht 5' 5 (1.651 m)   Wt 218 lb (98.9 kg)   LMP 09/20/2023   SpO2 96%   BMI 36.28 kg/m   Physical Exam Vitals and nursing note reviewed.  Constitutional:      General: She is not in acute distress.    Appearance: Normal appearance. She is not toxic-appearing.  HENT:     Head: Normocephalic and atraumatic.     Ears:     Comments: Left TM dull  Pulmonary:     Effort: Pulmonary effort is normal.  Skin:    General: Skin is warm and dry.  Neurological:     Mental Status: She is alert and oriented to person, place, and time. Mental status is at baseline.  Psychiatric:        Mood and Affect: Mood normal.        Behavior: Behavior normal.        Thought Content: Thought content normal.        Judgment: Judgment normal.     Results:  Studies Obtained And Personally Reviewed By Me:    Labs:  CBC w/ Differential Lab Results  Component Value Date   WBC 5.3 03/25/2022   RBC 4.44 03/25/2022   HGB 12.5 03/25/2022   HCT 39.4 03/25/2022   PLT 412 03/25/2022   MCV 89 03/25/2022   MCH 28.2 03/25/2022   MCHC 31.7 03/25/2022   RDW 12.8 03/25/2022   MPV 11.1 05/16/2020   LYMPHSABS 1.6 03/25/2022   MONOABS 0.7 02/26/2014    BASOSABS 0.1 03/25/2022    Comprehensive Metabolic Panel Lab Results  Component Value Date   NA 139 09/22/2023   K 4.6 09/22/2023   CL 103 09/22/2023   CO2 22 09/22/2023   GLUCOSE 84 09/22/2023   BUN 12 09/22/2023   CREATININE 0.79 09/22/2023   CALCIUM 9.5 09/22/2023   PROT 7.1 09/22/2023   ALBUMIN 4.4 09/22/2023   AST 16 09/22/2023   ALT 21 09/22/2023   ALKPHOS 63 09/22/2023   BILITOT 0.3 09/22/2023   EGFR 95  09/22/2023   GFRNONAA 103 03/18/2020   Lipid Panel  Lab Results  Component Value Date   CHOL 150 12/20/2022   HDL 55 12/20/2022   LDLCALC 85 12/20/2022   TRIG 47 12/20/2022   A1c Lab Results  Component Value Date   HGBA1C 5.4 09/22/2023    TSH Lab Results  Component Value Date   TSH 1.420 03/25/2022    Assessment & Plan:   Orders Placed This Encounter  Procedures   DG Chest 2 View    Reason for Exam (SYMPTOM  OR DIAGNOSIS REQUIRED):   Cough and congestion for several days    Is patient pregnant?:   No    Preferred imaging location?:   [facility]    Meds ordered this encounter  Medications   cefTRIAXone  (ROCEPHIN ) injection 1 g   HYDROcodone  bit-homatropine (HYCODAN) 5-1.5 MG/5ML syrup    Sig: Take 5 mLs by mouth every 8 (eight) hours as needed for cough.    Dispense:  120 mL    Refill:  0   pantoprazole  (PROTONIX ) 20 MG tablet    Sig: Take 1 tablet (20 mg total) by mouth daily.    Dispense:  30 tablet    Refill:  2    Acute lower respiratory infection: Her last visit was on 09/26/23 for an acute respiratory infection. She says that while she has gotten a little better, she still has a headache and a cough. The cough syrup that she had at home did nothing to relieve it. She has a productive cough that changed from white sputum to yellow sputum. She also still has ear pain. Denies fever, chills, nausea or vomiting, and shaking.  A chest X-ray from [place] [facility] was ordered to rule out Pneumonia. She received an injection of Rocephin  1 g  IM today and was prescribed Hycodan 5-1.5 mg/mL syrup 5 mL every 8 hours as needed.    GE reflux: She said that last week she had experienced acid reflux and that it had burned her ears and woke her up from her sleep and caused her to cough She said that this was the second time it had happened. She was taking OTC reflux medication and her mother told her that eating an apple would make it feel better. She said that did provide some relief.  She was prescribed Protonix  20 mg daily     Addendum: phone call to pt- CXR is negative. Left voice mail message    I,Emily Lagle,acting as a scribe for Ronal JINNY Hailstone, MD.,have documented all relevant documentation on the behalf of Ronal JINNY Hailstone, MD,as directed by  Ronal JINNY Hailstone, MD while in the presence of Ronal JINNY Hailstone, MD.  I, Ronal JINNY Hailstone, MD, have reviewed all documentation for this visit. The documentation on 09/30/2023 for the exam, diagnosis, procedures, and orders are all accurate and complete.

## 2023-09-30 NOTE — Patient Instructions (Addendum)
 CXR is negative for pneumonia.Left voice mail message about CXR results. Was prescribed Z-pak at last visit on Sept 8th. Symptoms have been slow to resolve and had episode of severe GE reflux. Have prescribed Protonix  for reflux and given one gram IM  Rocephin  in office.May take Hycodan for cough. Note given to excuse from work until Monday.

## 2023-10-02 NOTE — Patient Instructions (Signed)
 We are sorry you are not feeling well. Please take Zithromax  Z pak 2 tabs day 1 followed by one tab days 2-5. May take Diflucan  if you develop Candida vaginitis while on antibiotics. Rest and stay well hydrated. Note provided to excuse from work.

## 2023-10-18 ENCOUNTER — Other Ambulatory Visit

## 2023-10-18 DIAGNOSIS — Z13220 Encounter for screening for lipoid disorders: Secondary | ICD-10-CM

## 2023-10-18 DIAGNOSIS — Z0000 Encounter for general adult medical examination without abnormal findings: Secondary | ICD-10-CM

## 2023-10-18 DIAGNOSIS — R7303 Prediabetes: Secondary | ICD-10-CM

## 2023-10-18 DIAGNOSIS — E669 Obesity, unspecified: Secondary | ICD-10-CM

## 2023-10-18 DIAGNOSIS — E7439 Other disorders of intestinal carbohydrate absorption: Secondary | ICD-10-CM

## 2023-10-18 LAB — CBC WITH DIFFERENTIAL/PLATELET
Absolute Lymphocytes: 1745 {cells}/uL (ref 850–3900)
Absolute Monocytes: 561 {cells}/uL (ref 200–950)
Basophils Absolute: 50 {cells}/uL (ref 0–200)
Basophils Relative: 0.8 %
Eosinophils Absolute: 82 {cells}/uL (ref 15–500)
Eosinophils Relative: 1.3 %
HCT: 36.9 % (ref 35.0–45.0)
Hemoglobin: 12.2 g/dL (ref 11.7–15.5)
MCH: 29.5 pg (ref 27.0–33.0)
MCHC: 33.1 g/dL (ref 32.0–36.0)
MCV: 89.1 fL (ref 80.0–100.0)
MPV: 10.8 fL (ref 7.5–12.5)
Monocytes Relative: 8.9 %
Neutro Abs: 3862 {cells}/uL (ref 1500–7800)
Neutrophils Relative %: 61.3 %
Platelets: 353 Thousand/uL (ref 140–400)
RBC: 4.14 Million/uL (ref 3.80–5.10)
RDW: 12.4 % (ref 11.0–15.0)
Total Lymphocyte: 27.7 %
WBC: 6.3 Thousand/uL (ref 3.8–10.8)

## 2023-10-18 LAB — LIPID PANEL
Cholesterol: 153 mg/dL (ref <200)
HDL: 53 mg/dL (ref >=50)
LDL Cholesterol (Calc): 86 mg/dL
Non-HDL Cholesterol (Calc): 100 mg/dL (ref <130)
Total CHOL/HDL Ratio: 2.9 (calc) (ref <5.0)
Triglycerides: 47 mg/dL (ref <150)

## 2023-10-18 NOTE — Addendum Note (Signed)
 Addended by: Daxx Tiggs P on: 10/18/2023 09:55 AM   Modules accepted: Orders

## 2023-10-19 ENCOUNTER — Ambulatory Visit: Payer: Self-pay | Admitting: Internal Medicine

## 2023-10-24 ENCOUNTER — Ambulatory Visit (INDEPENDENT_AMBULATORY_CARE_PROVIDER_SITE_OTHER): Admitting: Adult Health

## 2023-10-24 ENCOUNTER — Telehealth (INDEPENDENT_AMBULATORY_CARE_PROVIDER_SITE_OTHER): Payer: Self-pay | Admitting: *Deleted

## 2023-10-24 ENCOUNTER — Encounter (INDEPENDENT_AMBULATORY_CARE_PROVIDER_SITE_OTHER): Payer: Self-pay | Admitting: Adult Health

## 2023-10-24 VITALS — BP 113/77 | HR 82 | Temp 98.7°F | Ht 65.0 in | Wt 217.0 lb

## 2023-10-24 DIAGNOSIS — E559 Vitamin D deficiency, unspecified: Secondary | ICD-10-CM | POA: Diagnosis not present

## 2023-10-24 DIAGNOSIS — R5383 Other fatigue: Secondary | ICD-10-CM | POA: Diagnosis not present

## 2023-10-24 DIAGNOSIS — R7303 Prediabetes: Secondary | ICD-10-CM | POA: Diagnosis not present

## 2023-10-24 DIAGNOSIS — K219 Gastro-esophageal reflux disease without esophagitis: Secondary | ICD-10-CM | POA: Diagnosis not present

## 2023-10-24 DIAGNOSIS — Z6836 Body mass index (BMI) 36.0-36.9, adult: Secondary | ICD-10-CM

## 2023-10-24 MED ORDER — ZEPBOUND 5 MG/0.5ML ~~LOC~~ SOLN: 5.0000 mg | SUBCUTANEOUS | 0 refills | Status: DC

## 2023-10-24 NOTE — Telephone Encounter (Signed)
 Medication prior for Mounjaro  was sent to plan :Message from Plan Your PA request has been denied. Additional information will be provided in the denial communication. (Message 1140)

## 2023-10-24 NOTE — Progress Notes (Signed)
 WEIGHT SUMMARY AND BIOMETRICS  Vitals Temp: 98.7 F (37.1 C) BP: 113/77 Pulse Rate: 82 SpO2: 100 %   Anthropometric Measurements Height: 5' 5 (1.651 m) Weight: 217 lb (98.4 kg) BMI (Calculated): 36.11 Weight at Last Visit: 218lb Weight Lost Since Last Visit: 1lb Weight Gained Since Last Visit: 0lb Starting Weight: 233lb Total Weight Loss (lbs): 16 lb (7.258 kg)   Body Composition  Body Fat %: 42 % Fat Mass (lbs): 91.4 lbs Muscle Mass (lbs): 120 lbs Total Body Water (lbs): 85.8 lbs Visceral Fat Rating : 10   Other Clinical Data Fasting: No Labs: No Today's Visit #: 84 Starting Date: 02/06/18    Chief Complaint:   OBESITY [name] is here to discuss her progress with her obesity treatment plan.  She is on the the Category 3 Plan and states she is following her eating plan approximately 75 % of the time.  She states she is exercising Walking 30 minutes 4 times per week.  Interim History:  Started on Zepbound  2.5mg  vial on/about 04/20/2023 Due to depressed appetite, she has decreased Zepbound  to 1/2 dose of the 2.5mg  vial She resumed full Zepbound  2.5mg  vial injection on 07/31/2023  Stress- her son (age 5 and in 3rd grade) has been getting into trouble at school (altercations with other students). This is very out of character for her son. She has followed up with his teacher, recommend that she contact the school guidance counselor.  Exercise- brisk walking at least 4 times per week  Subjective:   1. Fatigue, unspecified type Discussed Labs  Latest Reference Range & Units 09/22/23 09:38  Vitamin D , 25-Hydroxy 30.0 - 100.0 ng/mL 73.5   Vit D Level stable and at goal She is on daily OTC Vit D3 5000 international units   2. Gastroesophageal reflux disease without esophagitis She experienced a few days with acid reflux, r/t to larger portion sizes and spicier foods. PCP recently started on daily Protonix  20mg - she has yet to start. She reports  resolution of acute GI upset at present  3. Prediabetes Discussed Labs  Latest Reference Range & Units 09/22/23 09:38  Glucose 70 - 99 mg/dL 84  Hemoglobin J8R 4.8 - 5.6 % 5.4  Est. average glucose Bld gHb Est-mCnc mg/dL 891  INSULIN  2.6 - 24.9 uIU/mL 14.5   CBG and A1c both at goal Excellent reduction in insulin  level, still above goal of 5  Started on Zepbound  2.5mg  vial on/about 04/20/2023 Due to depressed appetite, she has decreased Zepbound  to 1/2 dose of the 2.5mg  vial She resumed full Zepbound  2.5mg  vial injection on 07/31/2023  She endorses recent polyphagia She is agreeable to increasing to 5mg   4. Vitamin D  deficiency Discussed Labs  Latest Reference Range & Units 09/22/23 09:38  Vitamin D , 25-Hydroxy 30.0 - 100.0 ng/mL 73.5   Vit D Level stable and at goal She is on daily OTC Vit D3 5000 international units   Assessment/Plan:   1. Fatigue, unspecified type (Primary) Continue healthy eating, regular walking, and Vit D supplementation.  2. Gastroesophageal reflux disease without esophagitis Do not overeat Avoid known trigger foods Use Protonix  as directed  3. Prediabetes Refill and INCREASE tirzepatide  (ZEPBOUND ) 5 MG/0.5ML injection vial Inject 5 mg into the skin once a week. Dispense: 2 mL, Refills: 0 ordered   4. Vitamin D  deficiency Continue daily OTC Vit D3 5000 international units   5. BMI 36.0-36.9,adult, CURRENT BMI 36.2 Refill and INCREASE tirzepatide  (ZEPBOUND ) 5 MG/0.5ML injection vial Inject 5 mg into  the skin once a week. Dispense: 2 mL, Refills: 0 ordered   [name] is currently in the action stage of change. As such, her goal is to continue with weight loss efforts. She has agreed to the Category 3 Plan.   Exercise goals: All adults should avoid inactivity. Some physical activity is better than none, and adults who participate in any amount of physical activity gain some health benefits. Adults should also include muscle-strengthening activities  that involve all major muscle groups on 2 or more days a week. Increase daily walking  Behavioral modification strategies: increasing lean protein intake, decreasing simple carbohydrates, increasing vegetables, increasing water intake, no skipping meals, meal planning and cooking strategies, keeping healthy foods in the home, ways to avoid boredom eating, and planning for success.  [name] has agreed to follow-up with our clinic in 4 weeks. She was informed of the importance of frequent follow-up visits to maximize her success with intensive lifestyle modifications for her multiple health conditions.   Objective:   Blood pressure 113/77, pulse 82, temperature 98.7 F (37.1 C), height 5' 5 (1.651 m), weight 217 lb (98.4 kg), SpO2 100%, unknown if currently breastfeeding. Body mass index is 36.11 kg/m.  General: Cooperative, alert, well developed, in no acute distress. HEENT: Conjunctivae and lids unremarkable. Cardiovascular: Regular rhythm.  Lungs: Normal work of breathing. Neurologic: No focal deficits.   Lab Results  Component Value Date   CREATININE 0.79 09/22/2023   BUN 12 09/22/2023   NA 139 09/22/2023   K 4.6 09/22/2023   CL 103 09/22/2023   CO2 22 09/22/2023   Lab Results  Component Value Date   ALT 21 09/22/2023   AST 16 09/22/2023   ALKPHOS 63 09/22/2023   BILITOT 0.3 09/22/2023   Lab Results  Component Value Date   HGBA1C 5.4 09/22/2023   HGBA1C 5.5 12/20/2022   HGBA1C 5.6 03/25/2022   HGBA1C 5.7 (H) 11/16/2021   HGBA1C 5.6 09/15/2021   Lab Results  Component Value Date   INSULIN  14.5 09/22/2023   INSULIN  29.1 (H) 12/20/2022   INSULIN  13.7 03/25/2022   INSULIN  17.3 11/16/2021   INSULIN  15.0 09/15/2021   Lab Results  Component Value Date   TSH 1.420 03/25/2022   Lab Results  Component Value Date   CHOL 153 10/18/2023   HDL 53 10/18/2023   LDLCALC 86 10/18/2023   TRIG 47 10/18/2023   CHOLHDL 2.9 10/18/2023   Lab Results  Component Value Date    VD25OH 73.5 09/22/2023   VD25OH 55.6 12/20/2022   VD25OH 50.7 03/25/2022   Lab Results  Component Value Date   WBC 6.3 10/18/2023   HGB 12.2 10/18/2023   HCT 36.9 10/18/2023   MCV 89.1 10/18/2023   PLT 353 10/18/2023   Lab Results  Component Value Date   IRON 54 09/30/2017   TIBC 373 09/30/2017   FERRITIN 81 09/30/2017   Attestation Statements:   Reviewed by clinician on day of visit: allergies, medications, problem list, medical history, surgical history, family history, social history, and previous encounter notes.  I have reviewed the above documentation for accuracy and completeness, and I agree with the above. -  Hardy Harcum d. Cashawn Yanko, NP-C

## 2023-10-25 ENCOUNTER — Ambulatory Visit (INDEPENDENT_AMBULATORY_CARE_PROVIDER_SITE_OTHER): Admitting: Internal Medicine

## 2023-10-25 ENCOUNTER — Encounter: Payer: Self-pay | Admitting: Internal Medicine

## 2023-10-25 VITALS — BP 110/80 | HR 90 | Ht 65.0 in | Wt 222.0 lb

## 2023-10-25 DIAGNOSIS — Z6836 Body mass index (BMI) 36.0-36.9, adult: Secondary | ICD-10-CM | POA: Diagnosis not present

## 2023-10-25 DIAGNOSIS — R7303 Prediabetes: Secondary | ICD-10-CM | POA: Diagnosis not present

## 2023-10-25 DIAGNOSIS — Z0000 Encounter for general adult medical examination without abnormal findings: Secondary | ICD-10-CM | POA: Diagnosis not present

## 2023-10-25 LAB — POCT URINALYSIS DIP (CLINITEK)
Bilirubin, UA: NEGATIVE
Blood, UA: NEGATIVE
Glucose, UA: NEGATIVE mg/dL
Ketones, POC UA: NEGATIVE mg/dL
Leukocytes, UA: NEGATIVE
Nitrite, UA: NEGATIVE
POC PROTEIN,UA: NEGATIVE
Spec Grav, UA: 1.01 (ref 1.010–1.025)
Urobilinogen, UA: 0.2 U/dL
pH, UA: 6.5 (ref 5.0–8.0)

## 2023-10-25 NOTE — Progress Notes (Signed)
 Annual Comprehensive Physical Exam   Patient Care Team: [name], MD as PCP - General (Internal Medicine) [name] as Veterinary surgeon (Behavioral Health) [name], MD as Consulting Physician (Obstetrics and Gynecology)  Visit Date: 10/25/23   Chief Complaint  Patient presents with   Annual Exam   Subjective:  Patient: [name], Female DOB: [birth year], 43 y.o. MRN: [id number] Vitals:   10/25/23 1505  BP: 110/80   [name] is a 43 y.o. Female who presents today for  Annual Comprehensive Physical Exam.   History of Anxiety and Depression, treated with Bupropion  300 mg daily and Prozac  20 mg daily. She has expressed interest in discontinuing Prozac . Should be able to taper and discontinue. She does not feel she needs it at this time.  History of Obesity and Hx of Glucose intolerance followed at [facility] for weight loss management. Current weight 217 pounds BMI 36.1. She's currently on Zepbound  5 mg weekly injections. HgbA1c 5.4% on Sept 4th. Other labs reviewed through [facility] are WNL.  History of Vitamin D  deficiency treated with  OTC Vitamin D3 daily.    04/13/2023 Pap smear normal through GYN.Had endometrial bx by Dr. Armond May 2025. Bx was negative for neoplasia.  05/27/2023 Mammogram no mammographic evidence of malignancy. Repeat in one year.    Health Maintenance: Had eye exam in May.    Vaccine counseling: Covid-19 vaccine due. Pneumonia vaccine deferred. Hepatitis B vaccine declined.   Health Maintenance  Topic Date Due   Pneumococcal Vaccine (1 of 2 - PCV) Never done   Diabetic kidney evaluation - Urine ACR  10/06/2019   OPHTHALMOLOGY EXAM  12/18/2021   COVID-19 Vaccine (4 - 2025-26 season) 09/19/2023   Influenza Vaccine  11/16/2023 (Originally 08/19/2023)   HEMOGLOBIN A1C  03/21/2024   Diabetic kidney evaluation - eGFR measurement  09/21/2024   FOOT EXAM  10/24/2024   DTaP/Tdap/Td (8 - Td or Tdap) 02/05/2025    Mammogram  05/26/2025   Cervical Cancer Screening (HPV/Pap Cotest)  04/20/2026   HPV VACCINES  Completed   HIV Screening  Completed   Meningococcal B Vaccine  Aged Out   Hepatitis B Vaccines 19-59 Average Risk  Discontinued   Hepatitis C Screening  Discontinued     Review of Systems  Constitutional:  Negative for fever and malaise/fatigue.  HENT:  Negative for congestion.   Eyes:  Negative for blurred vision.  Respiratory:  Negative for cough and shortness of breath.   Cardiovascular:  Negative for chest pain, palpitations and leg swelling.  Gastrointestinal:  Negative for vomiting.  Musculoskeletal:  Negative for back pain.  Skin:  Negative for rash.  Neurological:  Negative for loss of consciousness and headaches.   Objective:  Vitals: body mass index is 36.94 kg/m. Today's Vitals   10/25/23 1505  BP: 110/80  Pulse: 90  SpO2: 98%  Weight: 222 lb (100.7 kg)  Height: 5' 5 (1.651 m)   Physical Exam Vitals and nursing note reviewed.  Constitutional:      General: She is not in acute distress.    Appearance: Normal appearance. She is not ill-appearing or toxic-appearing.  HENT:     Head: Normocephalic and atraumatic.     Right Ear: Hearing, tympanic membrane, ear canal and external ear normal.     Left Ear: Hearing, tympanic membrane, ear canal and external ear normal.     Mouth/Throat:     Pharynx: Oropharynx is clear.  Eyes:     Extraocular  Movements: Extraocular movements intact.     Pupils: Pupils are equal, round, and reactive to light.  Neck:     Thyroid: No thyroid mass, thyromegaly or thyroid tenderness.     Vascular: No carotid bruit.  Cardiovascular:     Rate and Rhythm: Normal rate and regular rhythm. No extrasystoles are present.    Pulses:          Dorsalis pedis pulses are 2+ on the right side and 2+ on the left side.     Heart sounds: Normal heart sounds. No murmur heard.    No friction rub. No gallop.  Pulmonary:     Effort: Pulmonary effort is  normal.     Breath sounds: Normal breath sounds. No decreased breath sounds, wheezing, rhonchi or rales.  Chest:     Chest wall: No mass.  Abdominal:     Palpations: Abdomen is soft. There is no hepatomegaly, splenomegaly or mass.     Tenderness: There is no abdominal tenderness.     Hernia: No hernia is present.  Musculoskeletal:     Cervical back: Normal range of motion.     Right lower leg: No edema.     Left lower leg: No edema.  Lymphadenopathy:     Cervical: No cervical adenopathy.     Upper Body:     Right upper body: No supraclavicular adenopathy.     Left upper body: No supraclavicular adenopathy.  Skin:    General: Skin is warm and dry.  Neurological:     General: No focal deficit present.     Mental Status: She is alert and oriented to person, place, and time. Mental status is at baseline.     Sensory: Sensation is intact.     Motor: Motor function is intact. No weakness.     Deep Tendon Reflexes: Reflexes are normal and symmetric.  Psychiatric:        Attention and Perception: Attention normal.        Mood and Affect: Mood normal.        Speech: Speech normal.        Behavior: Behavior normal.        Thought Content: Thought content normal.        Cognition and Memory: Cognition normal.        Judgment: Judgment normal.     Current Outpatient Medications  Medication Instructions   buPROPion  (WELLBUTRIN  XL) 300 mg, Oral, Daily   FLUoxetine  (PROZAC ) 20 mg, Oral, Daily   HYDROcodone  bit-homatropine (HYCODAN) 5-1.5 MG/5ML syrup 5 mLs, Oral, Every 8 hours PRN   Multiple Vitamin (MULTIVITAMIN WITH MINERALS) TABS tablet 1 tablet, Daily   pantoprazole  (PROTONIX ) 20 mg, Oral, Daily   tretinoin (RETIN-A) 0.025 % cream APPLY A PEA SIZE AMOUNT TO FACE ONCE DAILY IN THE EVENING   Vitamin D3 5,000 Units, Oral, Daily   WINLEVI 1 % CREA Apply to affected area(s) on face twice daily   Zepbound  5 mg, Subcutaneous, Weekly   Past Medical History:  Diagnosis Date   Class 2  obesity due to excess calories with body mass index (BMI) of 35.0 to 35.9 in adult 02/08/2018   GERD (gastroesophageal reflux disease)    not currently   Gestational diabetes mellitus (GDM), antepartum    Heart murmur    HSV infection    Leaky heart valve    Prediabetes    Vitamin D  deficiency    Medical/Surgical History Narrative:  Allergic/Intolerant to: No Known Allergies  Past Surgical History:  Procedure Laterality Date   BREAST BIOPSY Right 05/02/2014   CESAREAN SECTION     CESAREAN SECTION N/A 04/21/2015   Procedure: CESAREAN SECTION;  Surgeon: Jerolyn Foil, MD;  Location: [facility];  Service: Obstetrics;  Laterality: N/A;   DILATION AND EVACUATION N/A 08/09/2013   Procedure: DILATATION AND EVACUATION;  Surgeon: Ovid DELENA All, MD;  Location: [facility];  Service: Gynecology;  Laterality: N/A;   excision of pilonidal cyst     EXCISION OF SKIN TAG Right 08/09/2013   Procedure: EXCISION OF SKIN TAG on right buttock;  Surgeon: Ovid DELENA All, MD;  Location: [facility];  Service: Gynecology;  Laterality: Right;  This procedure was added on after the patient had gone to sleep (as per Dr. All)   OPERATIVE ULTRASOUND N/A 08/09/2013   Procedure: OPERATIVE ULTRASOUND;  Surgeon: Ovid DELENA All, MD;  Location: [facility];  Service: Gynecology;  Laterality: N/A;   TONSILLECTOMY     Family History  Problem Relation Age of Onset   Hypertension Mother    Sleep apnea Mother    Obesity Mother    Diabetes Father    High Cholesterol Father    Cancer Father    Hypertension Brother    Diabetes Brother    Breast cancer Neg Hx    BRCA 1/2 Neg Hx      Most Recent Health Risks Assessment:   Most Recent Social Determinants of Health (Including Hx of Tobacco, Alcohol, and Drug Use) SDOH Screenings   Depression (PHQ2-9): Low Risk  (09/26/2023)  Tobacco Use: Low Risk  (10/25/2023)   Social History   Tobacco Use   Smoking status: Never   Smokeless tobacco: Never  Substance Use Topics   Alcohol use:  Yes    Comment: rarely   Drug use: No   Most Recent Functional Status Assessment:     No data to display         Most Recent Fall Risk Assessment:    09/30/2023   12:37 PM  Fall Risk   Falls in the past year? 0  Number falls in past yr: 0  Injury with Fall? 0  Risk for fall due to : No Fall Risks  Follow up Falls evaluation completed   Most Recent Anxiety/Depression Screenings:    09/26/2023   10:37 AM 04/22/2022   10:42 AM  PHQ 2/9 Scores  PHQ - 2 Score 0 1  PHQ- 9 Score 0       05/18/2018    2:06 PM 05/04/2018    2:11 PM 04/20/2018    4:21 PM 04/05/2018   12:06 PM  GAD 7 : Generalized Anxiety Score  Nervous, Anxious, on Edge 2 0 0 1  Control/stop worrying 0 0 0 1  Worry too much - different things 2 0 0 1  Trouble relaxing 1 0 0 0  Restless 0 0 0 0  Easily annoyed or irritable 1 0 1 0  Afraid - awful might happen 0 0 0 0  Total GAD 7 Score 6 0 1 3  Anxiety Difficulty Not difficult at all  Not difficult at all Not difficult at all   Results:  Studies Obtained And Personally Reviewed By Me: Diabetic Foot Exam - Simple   Simple Foot Form Visual Inspection No deformities, no ulcerations, no other skin breakdown bilaterally: Yes Sensation Testing Intact to touch and monofilament testing bilaterally: Yes Pulse Check Posterior Tibialis and Dorsalis pulse intact bilaterally: Yes Comments     04/13/2023 Pap smear normal  05/27/2023 Mammogram no mammographic evidence of malignancy. Repeat in one year.    Labs:  CBC w/ Differential Lab Results  Component Value Date   WBC 6.3 10/18/2023   RBC 4.14 10/18/2023   HGB 12.2 10/18/2023   HCT 36.9 10/18/2023   PLT 353 10/18/2023   MCV 89.1 10/18/2023   MCH 29.5 10/18/2023   MCHC 33.1 10/18/2023   RDW 12.4 10/18/2023   MPV 10.8 10/18/2023   LYMPHSABS 1.6 03/25/2022   MONOABS 0.7 02/26/2014   BASOSABS 50 10/18/2023    Comprehensive Metabolic Panel Lab Results  Component Value Date   NA 139 09/22/2023   K  4.6 09/22/2023   CL 103 09/22/2023   CO2 22 09/22/2023   GLUCOSE 84 09/22/2023   BUN 12 09/22/2023   CREATININE 0.79 09/22/2023   CALCIUM 9.5 09/22/2023   PROT 7.1 09/22/2023   ALBUMIN 4.4 09/22/2023   AST 16 09/22/2023   ALT 21 09/22/2023   ALKPHOS 63 09/22/2023   BILITOT 0.3 09/22/2023   EGFR 95 09/22/2023   GFRNONAA 103 03/18/2020   Lipid Panel  Lab Results  Component Value Date   CHOL 153 10/18/2023   HDL 53 10/18/2023   LDLCALC 86 10/18/2023   TRIG 47 10/18/2023   A1c Lab Results  Component Value Date   HGBA1C 5.4 09/22/2023    TSH Lab Results  Component Value Date   TSH 1.420 03/25/2022    Assessment & Plan:   Orders Placed This Encounter  Procedures   Microalbumin / creatinine urine ratio   POCT URINALYSIS DIP (CLINITEK)   Anxiety and Depression: treated with Bupropion  300 mg daily and Prozac  20 mg daily. She has expressed interest in discontinuing Prozac . Suggest she taper this. Can take every other day for a couple of weeks then twice a wwek for a couple of weeks then D/C  Obesity,Prediabetes; followed at [facility] for weight loss management. Current weight 217 pounds and  BMI 36.1. She's currently on Zepbound  5 mg weekly injections. HgbA1c 5.4%. Vitamin D , CBC, CMET,  obtained  at [facility] and reviewed.   Vitamin D  deficiency: treated with  OTC Vitamin D3 daily.   04/13/2023 Pap smear normal.   05/27/2023 Mammogram no mammographic evidence of malignancy. Repeat in one year.    Health Maintenance: Had eye exam in May.    Vaccine counseling: Covid-19 vaccine due. Pneumonia vaccine deferred. Hepatitis B vaccine declined.      Annual Comprehensive Physical Exam done today including the all of the following: Reviewed patient's Family Medical History Reviewed patient's SDOH and reviewed tobacco, alcohol, and drug use.  Reviewed and updated list of patient's medical providers Assessment of cognitive impairment was done Assessed  patient's functional ability Established a written schedule for health screening services Health Risk Assessent Completed and Reviewed  Discussed health benefits of physical activity, and encouraged her to engage in regular exercise appropriate for her age and condition.    I,Makayla C Courtney,acting as a scribe for Ronal JINNY Hailstone, MD.,have documented all relevant documentation on the behalf of Ronal JINNY Hailstone, MD,as directed by  Ronal JINNY Hailstone, MD while in the presence of Ronal JINNY Hailstone, MD.   I, Ronal JINNY Hailstone, MD, have reviewed all documentation for and agree with the above Annual Wellness Visit documentation.  Ronal JINNY Hailstone, MD Internal Medicine 10/25/2023

## 2023-10-26 LAB — MICROALBUMIN / CREATININE URINE RATIO
Creatinine, Urine: 158 mg/dL (ref 20–275)
Microalb Creat Ratio: 20 mg/g{creat} (ref <30)
Microalb, Ur: 3.1 mg/dL

## 2023-11-06 NOTE — Patient Instructions (Addendum)
 Continue your annual follow up with GYN and regular appts with [facility]. Labs are stable. It was good to see you today. Please return in one year for annual exam or as needed. Please have eye exam.

## 2023-11-18 ENCOUNTER — Other Ambulatory Visit: Payer: Self-pay | Admitting: Internal Medicine

## 2023-11-29 ENCOUNTER — Encounter (INDEPENDENT_AMBULATORY_CARE_PROVIDER_SITE_OTHER): Payer: Self-pay | Admitting: Adult Health

## 2023-11-29 ENCOUNTER — Ambulatory Visit (INDEPENDENT_AMBULATORY_CARE_PROVIDER_SITE_OTHER): Payer: Self-pay | Admitting: Adult Health

## 2023-11-29 VITALS — BP 126/69 | HR 88 | Temp 98.5°F | Ht 65.0 in | Wt 218.0 lb

## 2023-11-29 DIAGNOSIS — E7439 Other disorders of intestinal carbohydrate absorption: Secondary | ICD-10-CM | POA: Diagnosis not present

## 2023-11-29 DIAGNOSIS — R7303 Prediabetes: Secondary | ICD-10-CM | POA: Diagnosis not present

## 2023-11-29 DIAGNOSIS — E669 Obesity, unspecified: Secondary | ICD-10-CM

## 2023-11-29 DIAGNOSIS — Z6836 Body mass index (BMI) 36.0-36.9, adult: Secondary | ICD-10-CM

## 2023-11-29 DIAGNOSIS — E559 Vitamin D deficiency, unspecified: Secondary | ICD-10-CM | POA: Diagnosis not present

## 2023-11-29 DIAGNOSIS — K219 Gastro-esophageal reflux disease without esophagitis: Secondary | ICD-10-CM | POA: Diagnosis not present

## 2023-11-29 MED ORDER — TIRZEPATIDE-WEIGHT MANAGEMENT 2.5 MG/0.5ML ~~LOC~~ SOLN: 2.5000 mg | SUBCUTANEOUS | 0 refills | Status: DC

## 2023-11-29 NOTE — Progress Notes (Unsigned)
 WEIGHT SUMMARY AND BIOMETRICS  Vitals Temp: 98.5 F (36.9 C) BP: 126/69 Pulse Rate: 88 SpO2: 99 %   Anthropometric Measurements Height: 5' 5 (1.651 m) Weight: 218 lb (98.9 kg) BMI (Calculated): 36.28 Weight at Last Visit: 217lb Weight Lost Since Last Visit: 0lb Weight Gained Since Last Visit: 1lb Starting Weight: 233lb Total Weight Loss (lbs): 15 lb (6.804 kg)   Body Composition  Body Fat %: 41.1 % Fat Mass (lbs): 89.6 lbs Muscle Mass (lbs): 122 lbs Total Body Water (lbs): 86.2 lbs Visceral Fat Rating : 10   Other Clinical Data Fasting: No Labs: No Today's Visit #: 37 Starting Date: 02/06/18    Chief Complaint:   OBESITY [name] is here to discuss her progress with her obesity treatment plan.  She is on the the Category 3 Plan and states she is following her eating plan approximately 75 % of the time.  She states she is exercising Walking 30 minutes 3 times per week.  Interim History:  She and her family recently returned from 7 day trip to [place] She reports enjoying foods off plan, however walked all day/everyday  Reviewed Bioimpedance Results with pt: Muscle Mass:+2 lbs Adipose Mass:-1.8 lbs  She has been off weekly Zepbound  2.5mg  for several weeks- she declined the last refill of 5mg  She would like to restart the 2.5mg  stregth  Subjective:   1. Glucose intolerance Started on Zepbound  2.5mg  vial on/about 04/20/2023 Due to depressed appetite, she has decreased Zepbound  to 1/2 dose of the 2.5mg  vial 04/27/2023, 05/04/2023, 05/11/2023 continued on Zepbound  to 1/2 dose of the 2.5mg  vial She held her Zepbound  while traveling- resumed full Zepbound  2.5mg  injection on 07/31/2023 10/24/2023 Zepbound  2.5mg  increased to 5mg - she did not REFILL this RX She has been off Zepbound  2.5mg  for last 4 weeks She would like to restart at the loading dose  2. Prediabetes Lab Results  Component Value Date   HGBA1C 5.4 09/22/2023   HGBA1C 5.5 12/20/2022   HGBA1C  5.6 03/25/2022    A1c at goal > 6 months  3. Vitamin D  deficiency  Latest Reference Range & Units 03/25/22 09:17 12/20/22 09:08 09/22/23 09:38  Vitamin D , 25-Hydroxy 30.0 - 100.0 ng/mL 50.7 55.6 73.5   Vit D level stable > 6 months  4. Gastroesophageal reflux disease without esophagitis She reports GERD sx's well controlled She is on daily Protonix  20mg   Assessment/Plan:   1. Glucose intolerance Restart tirzepatide  (ZEPBOUND ) 2.5 MG/0.5ML injection vial Inject 2.5 mg into the skin once a week. Dispense: 2 mL, Refills: 0 ordered   2. Prediabetes (Primary) Restart tirzepatide  (ZEPBOUND ) 2.5 MG/0.5ML injection vial Inject 2.5 mg into the skin once a week. Dispense: 2 mL, Refills: 0 ordered   3. Vitamin D  deficiency Monitor Labs tirzepatide  (ZEPBOUND ) 2.5 MG/0.5ML injection vial Inject 2.5 mg into the skin once a week. Dispense: 2 mL, Refills: 0 ordered   4. Gastroesophageal reflux disease without esophagitis Avoid known trigger foods Do not overeat or too closely to bedtime  5. BMI 36.0-36.9,adult, CURRENT BMI 36.2 Restart tirzepatide  (ZEPBOUND ) 2.5 MG/0.5ML injection vial Inject 2.5 mg into the skin once a week. Dispense: 2 mL, Refills: 0 ordered   [name] is currently in the action stage of change. As such, her goal is to continue with weight loss efforts. She has agreed to the Category 3 Plan.   Exercise goals: For substantial health benefits, adults should do at least 150 minutes (2 hours and 30 minutes) a week of moderate-intensity, or 75  minutes (1 hour and 15 minutes) a week of vigorous-intensity aerobic physical activity, or an equivalent combination of moderate- and vigorous-intensity aerobic activity. Aerobic activity should be performed in episodes of at least 10 minutes, and preferably, it should be spread throughout the week.  Behavioral modification strategies: increasing lean protein intake, decreasing simple carbohydrates, increasing vegetables, increasing water  intake, meal planning and cooking strategies, keeping healthy foods in the home, ways to avoid boredom eating, ways to avoid night time snacking, and planning for success.  [name] has agreed to follow-up with our clinic in 4 weeks. She was informed of the importance of frequent follow-up visits to maximize her success with intensive lifestyle modifications for her multiple health conditions.   Objective:   Blood pressure 126/69, pulse 88, temperature 98.5 F (36.9 C), height 5' 5 (1.651 m), weight 218 lb (98.9 kg), SpO2 99%, unknown if currently breastfeeding. Body mass index is 36.28 kg/m.  General: Cooperative, alert, well developed, in no acute distress. HEENT: Conjunctivae and lids unremarkable. Cardiovascular: Regular rhythm.  Lungs: Normal work of breathing. Neurologic: No focal deficits.   Lab Results  Component Value Date   CREATININE 0.79 09/22/2023   BUN 12 09/22/2023   NA 139 09/22/2023   K 4.6 09/22/2023   CL 103 09/22/2023   CO2 22 09/22/2023   Lab Results  Component Value Date   ALT 21 09/22/2023   AST 16 09/22/2023   ALKPHOS 63 09/22/2023   BILITOT 0.3 09/22/2023   Lab Results  Component Value Date   HGBA1C 5.4 09/22/2023   HGBA1C 5.5 12/20/2022   HGBA1C 5.6 03/25/2022   HGBA1C 5.7 (H) 11/16/2021   HGBA1C 5.6 09/15/2021   Lab Results  Component Value Date   INSULIN  14.5 09/22/2023   INSULIN  29.1 (H) 12/20/2022   INSULIN  13.7 03/25/2022   INSULIN  17.3 11/16/2021   INSULIN  15.0 09/15/2021   Lab Results  Component Value Date   TSH 1.420 03/25/2022   Lab Results  Component Value Date   CHOL 153 10/18/2023   HDL 53 10/18/2023   LDLCALC 86 10/18/2023   TRIG 47 10/18/2023   CHOLHDL 2.9 10/18/2023   Lab Results  Component Value Date   VD25OH 73.5 09/22/2023   VD25OH 55.6 12/20/2022   VD25OH 50.7 03/25/2022   Lab Results  Component Value Date   WBC 6.3 10/18/2023   HGB 12.2 10/18/2023   HCT 36.9 10/18/2023   MCV 89.1 10/18/2023   PLT 353  10/18/2023   Lab Results  Component Value Date   IRON 54 09/30/2017   TIBC 373 09/30/2017   FERRITIN 81 09/30/2017   Attestation Statements:   Reviewed by clinician on day of visit: allergies, medications, problem list, medical history, surgical history, family history, social history, and previous encounter notes.  I have reviewed the above documentation for accuracy and completeness, and I agree with the above. -  Tripton Ned d. Kegan Shepardson, NP-C

## 2023-12-22 ENCOUNTER — Other Ambulatory Visit (INDEPENDENT_AMBULATORY_CARE_PROVIDER_SITE_OTHER): Payer: Self-pay | Admitting: Adult Health

## 2024-01-03 ENCOUNTER — Ambulatory Visit (INDEPENDENT_AMBULATORY_CARE_PROVIDER_SITE_OTHER): Admitting: Adult Health

## 2024-01-04 ENCOUNTER — Encounter: Payer: Self-pay | Admitting: Internal Medicine

## 2024-01-06 ENCOUNTER — Ambulatory Visit: Admitting: Internal Medicine

## 2024-01-06 ENCOUNTER — Encounter: Payer: Self-pay | Admitting: Internal Medicine

## 2024-01-06 VITALS — BP 130/80 | HR 82 | Ht 65.0 in | Wt 228.0 lb

## 2024-01-06 DIAGNOSIS — M79661 Pain in right lower leg: Secondary | ICD-10-CM

## 2024-01-06 DIAGNOSIS — R202 Paresthesia of skin: Secondary | ICD-10-CM

## 2024-01-06 DIAGNOSIS — M79662 Pain in left lower leg: Secondary | ICD-10-CM

## 2024-01-06 DIAGNOSIS — R200 Anesthesia of skin: Secondary | ICD-10-CM

## 2024-01-06 NOTE — Progress Notes (Addendum)
 "   Patient Care Team: [name], MD as PCP - General (Internal Medicine) [name] as Veterinary Surgeon ([facility]) [name], MD as Consulting Physician (Obstetrics and Gynecology)  Visit Date: 01/06/2024  Subjective:    Patient ID: [name] , Female   DOB: Nov 15, 1980, 43 y.o.    MRN: [id number]   43 y.o. Female presents today for tingling in legs. Patient has a past medical history of Vitamin D  deficiency, Glucose intolerance, Obesity.  She said that she is a experiencing vague senations in lower legs. Not sure if spasms or tingling sensation. Maybe some of both. Has had been doing heavy physical exercise. She is worried about these symptoms. She said she feels it in both legs but the right lower leg is worse. She first noticed it a month ago and she says that she experiences it more when she lies down.She denies having back pain. She said that she has nerve damage in her neck and has carpel tunnel bilaterally for which she recently received a steroid injection. In September 2025 CMET, HgbA1c, CBC, and  Lipid panel were all normal.   History of Glucose intolerance; Currently on Zepbound  5 mg weekly. 09/22/2023  was excellent syHgbA1c 5.4%.   History of Vitamin D  deficiency treated with  OTC Vitamin D3 daily.    Past Medical History:  Diagnosis Date   Class 2 obesity due to excess calories with body mass index (BMI) of 35.0 to 35.9 in adult 02/08/2018   GERD (gastroesophageal reflux disease)    not currently   Gestational diabetes mellitus (GDM), antepartum    Heart murmur    HSV infection    Leaky heart valve    Prediabetes    Vitamin D  deficiency      Family History  Problem Relation Age of Onset   Hypertension Mother    Sleep apnea Mother    Obesity Mother    Diabetes Father    High Cholesterol Father    Cancer Father    Hypertension Brother    Diabetes Brother    Breast cancer Neg Hx    BRCA 1/2 Neg Hx        Review of Systems   Musculoskeletal:  Positive for myalgias.        Objective:   Vitals: BP 130/80   Pulse 82   Ht 5' 5 (1.651 m)   Wt 228 lb (103.4 kg)   LMP 12/30/2023   SpO2 98%   BMI 37.94 kg/m    Physical Exam Cardiovascular:     Pulses:          Dorsalis pedis pulses are 1+ on the right side and 1+ on the left side.  Feet:     Comments: Sensation and positional sense intact.       Results:    Labs:       Component Value Date/Time   NA 139 09/22/2023 0938   K 4.6 09/22/2023 0938   CL 103 09/22/2023 0938   CO2 22 09/22/2023 0938   GLUCOSE 84 09/22/2023 0938   GLUCOSE 88 03/18/2020 1636   BUN 12 09/22/2023 0938   CREATININE 0.79 09/22/2023 0938   CREATININE 0.73 03/18/2020 1636   CALCIUM 9.5 09/22/2023 0938   PROT 7.1 09/22/2023 0938   ALBUMIN 4.4 09/22/2023 0938   AST 16 09/22/2023 0938   ALT 21 09/22/2023 0938   ALKPHOS 63 09/22/2023 0938   BILITOT 0.3 09/22/2023 0938   GFRNONAA 103 03/18/2020 1636   GFRAA  119 03/18/2020 1636     Lab Results  Component Value Date   WBC 6.3 10/18/2023   HGB 12.2 10/18/2023   HCT 36.9 10/18/2023   MCV 89.1 10/18/2023   PLT 353 10/18/2023    Lab Results  Component Value Date   CHOL 153 10/18/2023   HDL 53 10/18/2023   LDLCALC 86 10/18/2023   TRIG 47 10/18/2023   CHOLHDL 2.9 10/18/2023    Lab Results  Component Value Date   HGBA1C 5.4 09/22/2023     Lab Results  Component Value Date   TSH 1.420 03/25/2022        Assessment & Plan:   Orders Placed This Encounter  Procedures   B12 and Folate Panel   T4, free   TSH   Musculoskeletal pain/spasms- it is not clear to me if these sensations are spasms or paresthesias. She cannot say exactly what the sensations are like. Numbness and tingling of lower extremities She said that she is a experiencing spasm like pain that radiated up the front of her legs to her knees. She said she feels it in both legs but the right one hurts more. She noticed it a month ago and she  says that she experiences it more when she lays down.She denies having back pain. She said that she has nerve damage in her neck and has carpel tunnel syndrome bilaterally for which she recently received a steroid injection. In September CMET, HgbA1c, CBC, and  Lipid panel were normal.    B12 and Folate, Free T4, and TSH ordered. These tests are normal.  Glucose intolerance: Currently on Zepbound  5 mg weekly. 09/22/2023 HgbA1c 5.4%.   Vitamin D  deficiency: treated with  OTC Vitamin D3 daily.   Plan: No evidence of hypothyroidism, B12 or folate deficiency causing these symptoms. She takes Vitamin D  supplement. If symptoms persist, she will be referred to Neurology for evaluation of peripheral neuropathy.    I,Makayla C Reid,acting as a scribe for Ronal JINNY Hailstone, MD.,have documented all relevant documentation on the behalf of Ronal JINNY Hailstone, MD,as directed by  Ronal JINNY Hailstone, MD while in the presence of Ronal JINNY Hailstone, MD.  I, Ronal JINNY Hailstone, MD, have reviewed all documentation for this visit. The documentation on 01/06/2024 for the exam, diagnosis, procedures, and orders are all accurate and complete.     "

## 2024-01-07 ENCOUNTER — Ambulatory Visit: Payer: Self-pay | Admitting: Internal Medicine

## 2024-01-07 LAB — T4, FREE: Free T4: 0.9 ng/dL (ref 0.8–1.8)

## 2024-01-07 LAB — B12 AND FOLATE PANEL
Folate: 15 ng/mL
Vitamin B-12: 421 pg/mL (ref 200–1100)

## 2024-01-07 LAB — TSH: TSH: 1.26 m[IU]/L

## 2024-01-14 ENCOUNTER — Encounter: Payer: Self-pay | Admitting: Internal Medicine

## 2024-01-14 NOTE — Patient Instructions (Signed)
 Today we drew B12, folate, free T4 and TSH.  All of these levels were normal.  In September she had c-Met, hemoglobin A1c to check for diabetes, CBC and lipid panel.  These were all normal.  She remains on Zepbound  for glucose intolerance and hemoglobin A1c in September was excellent at 5.4%.  If symptoms persist, she will be referred to neurologist for evaluation of peripheral neuropathy.

## 2024-02-07 ENCOUNTER — Ambulatory Visit (INDEPENDENT_AMBULATORY_CARE_PROVIDER_SITE_OTHER): Admitting: Adult Health

## 2024-02-07 ENCOUNTER — Encounter (INDEPENDENT_AMBULATORY_CARE_PROVIDER_SITE_OTHER): Payer: Self-pay | Admitting: Adult Health

## 2024-02-07 VITALS — BP 127/76 | HR 89 | Temp 98.3°F | Ht 65.0 in | Wt 226.0 lb

## 2024-02-07 DIAGNOSIS — E669 Obesity, unspecified: Secondary | ICD-10-CM

## 2024-02-07 DIAGNOSIS — Z6837 Body mass index (BMI) 37.0-37.9, adult: Secondary | ICD-10-CM

## 2024-02-07 DIAGNOSIS — E7439 Other disorders of intestinal carbohydrate absorption: Secondary | ICD-10-CM

## 2024-02-07 DIAGNOSIS — R200 Anesthesia of skin: Secondary | ICD-10-CM

## 2024-02-07 DIAGNOSIS — R7303 Prediabetes: Secondary | ICD-10-CM | POA: Diagnosis not present

## 2024-02-07 DIAGNOSIS — E559 Vitamin D deficiency, unspecified: Secondary | ICD-10-CM

## 2024-02-07 DIAGNOSIS — Z6836 Body mass index (BMI) 36.0-36.9, adult: Secondary | ICD-10-CM

## 2024-02-07 DIAGNOSIS — R202 Paresthesia of skin: Secondary | ICD-10-CM | POA: Diagnosis not present

## 2024-02-07 MED ORDER — ZEPBOUND 5 MG/0.5ML ~~LOC~~ SOLN: 5.0000 mg | SUBCUTANEOUS | 0 refills | Status: AC

## 2024-02-07 NOTE — Progress Notes (Signed)
 "    WEIGHT SUMMARY AND BIOMETRICS  Vitals Temp: 98.3 F (36.8 C) BP: 127/76 Pulse Rate: 89 SpO2: 99 %   Anthropometric Measurements Height: 5' 5 (1.651 m) Weight: 226 lb (102.5 kg) BMI (Calculated): 37.61 Weight at Last Visit: 218lb Weight Lost Since Last Visit: 0lb Weight Gained Since Last Visit: 8lb Starting Weight: 233lb Total Weight Loss (lbs): 7 lb (3.175 kg)   Body Composition  Body Fat %: 42.9 % Fat Mass (lbs): 97.2 lbs Muscle Mass (lbs): 122.6 lbs Total Body Water (lbs): 86.4 lbs Visceral Fat Rating : 11   Other Clinical Data RMR: 1959 Fasting: Yes Labs: No Today's Visit #: 72 Starting Date: 02/06/18    Chief Complaint:   OBESITY [name] is here to discuss her progress with her obesity treatment plan.  She is on the the Category 3 Plan and states she is following her eating plan approximately 0 % of the time.  She states she is exercising: None  Interim History:  Last Ov at [facility] was 11/29/2023 She never restarted Zepbound  2.5mg  in Nov 2025, rather restarted yesterday 02/06/2024 She would like to take Zepbound  2.5mg  for the next three weeks, then increase to the next strength of 5mg  She reports sig increased cravings and subsequent snacking and consuming sugary drinks the last 2 months.  She has not been walking regularly due to hectic schedule at work and with her children's activities   Subjective:   1. Vitamin D  deficiency  Latest Reference Range & Units 03/25/22 09:17 12/20/22 09:08 09/22/23 09:38  Vitamin D , 25-Hydroxy 30.0 - 100.0 ng/mL 50.7 55.6 73.5   Vit D Level at goal  2. Glucose intolerance  Latest Reference Range & Units 03/25/22 09:17 12/20/22 09:08 09/22/23 09:38  Glucose 70 - 99 mg/dL 90 874 (H) 84  (H): Data is abnormally high  CBG level improved She restarted weekly Zepbound  2.5mg  on 02/06/2024 Denies mass in neck, dysphagia, dyspepsia, persistent hoarseness, abdominal pain, or N/V/C   3. Prediabetes Lab Results   Component Value Date   HGBA1C 5.4 09/22/2023   HGBA1C 5.5 12/20/2022   HGBA1C 5.6 03/25/2022    CBG level improved She restarted weekly Zepbound  2.5mg  on 02/06/2024 Denies mass in neck, dysphagia, dyspepsia, persistent hoarseness, abdominal pain, or N/V/C   4. paresthesias lower extremities 01/06/2024 PCP OV Notes: 44 y.o. Female presents today for tingling in legs. Patient has a past medical history of Vitamin D  deficiency, Glucose intolerance, Obesity.   She said that she is a experiencing vague senations in lower legs. Not sure if spasms or tingling sensation. Maybe some of both. Has had been doing heavy physical exercise. She is worried about these symptoms. She said she feels it in both legs but the right lower leg is worse. She first noticed it a month ago and she says that she experiences it more when she lies down.She denies having back pain. She said that she has nerve damage in her neck and has carpel tunnel bilaterally for which she recently received a steroid injection. In September 2025 CMET, HgbA1c, CBC, and  Lipid panel were all normal.    History of Glucose intolerance; Currently on Zepbound  5 mg weekly. 09/22/2023  was excellent syHgbA1c 5.4%.    History of Vitamin D  deficiency treated with  OTC Vitamin D3 daily.    Labs completed and were normal: Thyroid functions, B12, Folate  Currently- she denies lower extremity edema or numbness/tingling  Assessment/Plan:   1. Vitamin D  deficiency (Primary) Continue current supplementation  2. Glucose intolerance Resume Cat 3 MP and regular walking Continue weekly GIP/GLP-1 therapy per titration schedule  3. Prediabetes Resume Cat 3 MP and regular walking Continue weekly GIP/GLP-1 therapy per titration schedule  4. paresthesias lower extremities Avoid wearing high heels Resume regular walking Monitor for acute sx's  5. BMI 36.0-36.9,adult, CURRENT BMI 37.7 Refill and INCREASE- AFTER the final 3 injections of 2.5  strength Pt verbalized understanding and agreement  tirzepatide  (ZEPBOUND ) 5 MG/0.5ML injection vial Inject 5 mg into the skin once a week. Dispense: 2 mL, Refills: 0 ordered   [name] is not currently in the action stage of change. As such, her goal is to get back to weightloss efforts . She has agreed to the Category 3 Plan.   Exercise goals: All adults should avoid inactivity. Some physical activity is better than none, and adults who participate in any amount of physical activity gain some health benefits. Adults should also include muscle-strengthening activities that involve all major muscle groups on 2 or more days a week.  Behavioral modification strategies: increasing lean protein intake, decreasing simple carbohydrates, increasing vegetables, increasing water intake, decreasing liquid calories, decreasing sodium intake, increasing high fiber foods, decreasing eating out, no skipping meals, meal planning and cooking strategies, keeping healthy foods in the home, ways to avoid boredom eating, better snacking choices, emotional eating strategies, planning for success, and decreasing junk food.  [name] has agreed to follow-up with our clinic in 4 weeks. She was informed of the importance of frequent follow-up visits to maximize her success with intensive lifestyle modifications for her multiple health conditions.   Check Fasting Labs at next OV  Objective:   Blood pressure 127/76, pulse 89, temperature 98.3 F (36.8 C), height 5' 5 (1.651 m), weight 226 lb (102.5 kg), SpO2 99%, unknown if currently breastfeeding. Body mass index is 37.61 kg/m.  General: Cooperative, alert, well developed, in no acute distress. HEENT: Conjunctivae and lids unremarkable. Cardiovascular: Regular rhythm.  Lungs: Normal work of breathing. Neurologic: No focal deficits.   Lab Results  Component Value Date   CREATININE 0.79 09/22/2023   BUN 12 09/22/2023   NA 139 09/22/2023   K 4.6 09/22/2023   CL  103 09/22/2023   CO2 22 09/22/2023   Lab Results  Component Value Date   ALT 21 09/22/2023   AST 16 09/22/2023   ALKPHOS 63 09/22/2023   BILITOT 0.3 09/22/2023   Lab Results  Component Value Date   HGBA1C 5.4 09/22/2023   HGBA1C 5.5 12/20/2022   HGBA1C 5.6 03/25/2022   HGBA1C 5.7 (H) 11/16/2021   HGBA1C 5.6 09/15/2021   Lab Results  Component Value Date   INSULIN  14.5 09/22/2023   INSULIN  29.1 (H) 12/20/2022   INSULIN  13.7 03/25/2022   INSULIN  17.3 11/16/2021   INSULIN  15.0 09/15/2021   Lab Results  Component Value Date   TSH 1.26 01/06/2024   Lab Results  Component Value Date   CHOL 153 10/18/2023   HDL 53 10/18/2023   LDLCALC 86 10/18/2023   TRIG 47 10/18/2023   CHOLHDL 2.9 10/18/2023   Lab Results  Component Value Date   VD25OH 73.5 09/22/2023   VD25OH 55.6 12/20/2022   VD25OH 50.7 03/25/2022   Lab Results  Component Value Date   WBC 6.3 10/18/2023   HGB 12.2 10/18/2023   HCT 36.9 10/18/2023   MCV 89.1 10/18/2023   PLT 353 10/18/2023   Lab Results  Component Value Date   IRON 54 09/30/2017   TIBC 373 09/30/2017  FERRITIN 81 09/30/2017   Attestation Statements:   Reviewed by clinician on day of visit: allergies, medications, problem list, medical history, surgical history, family history, social history, and previous encounter notes.  I have reviewed the above documentation for accuracy and completeness, and I agree with the above. -  Starling Christofferson d. Brion Hedges, NP-C "

## 2024-03-13 ENCOUNTER — Ambulatory Visit (INDEPENDENT_AMBULATORY_CARE_PROVIDER_SITE_OTHER): Admitting: Adult Health
